# Patient Record
Sex: Female | Born: 1935
Health system: Southern US, Community
[De-identification: ages and names within clinical notes are randomized; demographics above are authoritative.]

## PROBLEM LIST (undated history)

## (undated) DIAGNOSIS — K759 Inflammatory liver disease, unspecified: Secondary | ICD-10-CM

## (undated) DIAGNOSIS — K859 Acute pancreatitis without necrosis or infection, unspecified: Secondary | ICD-10-CM

## (undated) DIAGNOSIS — G510 Bell's palsy: Secondary | ICD-10-CM

## (undated) DIAGNOSIS — E785 Hyperlipidemia, unspecified: Secondary | ICD-10-CM

## (undated) DIAGNOSIS — I4891 Unspecified atrial fibrillation: Secondary | ICD-10-CM

## (undated) DIAGNOSIS — I10 Essential (primary) hypertension: Secondary | ICD-10-CM

## (undated) DIAGNOSIS — G459 Transient cerebral ischemic attack, unspecified: Secondary | ICD-10-CM

## (undated) DIAGNOSIS — I679 Cerebrovascular disease, unspecified: Secondary | ICD-10-CM

## (undated) DIAGNOSIS — K219 Gastro-esophageal reflux disease without esophagitis: Secondary | ICD-10-CM

## (undated) HISTORY — DX: Hyperlipidemia, unspecified: E78.5

## (undated) HISTORY — DX: Essential (primary) hypertension: I10

## (undated) HISTORY — DX: Transient cerebral ischemic attack, unspecified: G45.9

## (undated) HISTORY — PX: CATARACT EXTRACTION: SUR2

## (undated) HISTORY — DX: Cerebrovascular disease, unspecified: I67.9

## (undated) HISTORY — PX: CHOLECYSTECTOMY: SHX55

## (undated) HISTORY — DX: Bell's palsy: G51.0

## (undated) HISTORY — PX: APPENDECTOMY: SHX54

---
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
## 2003-08-28 ENCOUNTER — Emergency Department (HOSPITAL_COMMUNITY): Admission: EM | Admit: 2003-08-28 | Discharge: 2003-08-28 | Payer: Self-pay | Admitting: Emergency Medicine

## 2003-09-03 ENCOUNTER — Emergency Department (HOSPITAL_COMMUNITY): Admission: EM | Admit: 2003-09-03 | Discharge: 2003-09-03 | Payer: Self-pay | Admitting: Emergency Medicine

## 2003-12-08 ENCOUNTER — Ambulatory Visit: Payer: Self-pay | Admitting: Family Medicine

## 2004-12-01 ENCOUNTER — Ambulatory Visit: Payer: Self-pay | Admitting: Family Medicine

## 2004-12-22 ENCOUNTER — Ambulatory Visit: Payer: Self-pay | Admitting: Family Medicine

## 2005-02-02 ENCOUNTER — Ambulatory Visit: Payer: Self-pay | Admitting: Family Medicine

## 2005-04-06 ENCOUNTER — Ambulatory Visit: Payer: Self-pay | Admitting: Family Medicine

## 2005-04-28 ENCOUNTER — Inpatient Hospital Stay (HOSPITAL_COMMUNITY): Admission: EM | Admit: 2005-04-28 | Discharge: 2005-05-03 | Payer: Self-pay | Admitting: Emergency Medicine

## 2005-05-05 ENCOUNTER — Ambulatory Visit: Payer: Self-pay | Admitting: Family Medicine

## 2005-05-12 ENCOUNTER — Ambulatory Visit: Payer: Self-pay | Admitting: Family Medicine

## 2005-06-16 ENCOUNTER — Ambulatory Visit: Payer: Self-pay | Admitting: Family Medicine

## 2005-06-21 ENCOUNTER — Ambulatory Visit: Payer: Self-pay | Admitting: Internal Medicine

## 2007-01-18 DIAGNOSIS — G510 Bell's palsy: Secondary | ICD-10-CM

## 2007-01-18 HISTORY — DX: Bell's palsy: G51.0

## 2007-04-24 ENCOUNTER — Emergency Department (HOSPITAL_COMMUNITY): Admission: EM | Admit: 2007-04-24 | Discharge: 2007-04-24 | Payer: Self-pay | Admitting: Emergency Medicine

## 2007-08-21 ENCOUNTER — Ambulatory Visit (HOSPITAL_COMMUNITY): Admission: RE | Admit: 2007-08-21 | Discharge: 2007-08-21 | Payer: Self-pay | Admitting: Neurology

## 2007-11-09 ENCOUNTER — Ambulatory Visit: Payer: Self-pay | Admitting: Cardiology

## 2010-06-04 NOTE — Group Therapy Note (Signed)
NAMELUCAS, Danielle Colon              ACCOUNT NO.:  [id number]   MEDICAL RECORD NO.:  [id number]          PATIENT TYPE:  INP   LOCATION:  A313                          FACILITY:  [facility]   PHYSICIAN:  Margaretmary Dys, M.D.DATE OF BIRTH:  [birth year]   DATE OF PROCEDURE:  05/01/2005  DATE OF DISCHARGE:                                   PROGRESS NOTE   SUBJECTIVE:  The patient continues to do fairly well.  She says her pain is  much better.  However, it appears that the patient is a little  hallucinatory.  The patient reports that she has seen some objects and  things on the wall.  She has no history of alcohol, as far as we know.  My  concern is that it is probably due to the narcotic analgesia.  Overall, she  feels great.  She has had some bowel movements, and her diet has been  advanced with good tolerance.   OBJECTIVE:  GENERAL:  Conscious, alert, comfortable, not in acute distress.  VITAL SIGNS:  Blood pressure of 132/64, pulse 79, respirations 20,  temperature 98.5.  HEENT:  Normocephalic and atraumatic.  Oral mucosa was moist.  No exudates.  NECK:  Supple.  No JVD.  LUNGS:  Clear clinically.  Good air entry bilaterally.  HEART:  S1 and S2 regular.  No S3, S4, gallops or rubs.  ABDOMEN:  Soft, nontender.  Bowel sounds positive.  No masses palpable.  EXTREMITIES:  No pitting pedal edema.  No calf induration or tenderness was  noted.  CNS:  Conscious, alert, well oriented to time, place, and person.  The  patient complains of visual hallucinations with no auditory hallucinations.   LABORATORY/DIAGNOSTIC DATA:  White blood cell count was 16.3, hemoglobin  12.1, hematocrit 35.6, platelet count 259 with no left shift.  Sodium of  132, potassium 4, chloride 97, CO2 of 29, glucose 141, BUN 2, creatinine  0.9.  AST is down to 43, ALT down to 63.  Alkaline phosphatase was 189.  Albumin 2.7, amylase 46, lipase 23.   ASSESSMENT AND PLAN:  1.  Mental status changes.  The patient is having  some hallucinations, and I      think it probably related to Dilaudid.  I will discontinue Dilaudid now      and put her on Percocet as needed, as the patient is better controlled      by the patient.  Will institute neurologic checks q.4h. to make sure she      does not go into delirium tremens.  If she does, I will have concerns of      probably alcohol use that is not known to [place].  2.  Acute pancreatitis.  This is significantly improved.  Amylase and lipase      are back to normal.  The etiology is unclear.  Diet is well advanced      with good effect.  3.  Elevated liver function tests.  AST and ALT has continued to improve.   DISPOSITION:  Neurologic checks to monitor for any worsening mental status  with decreased IV  fluids to 75 mL an hour, and transfer to 3A as planned  above.  No indication for GI consultation at this time.  Will continue to  monitor her white cell count.  She has no evidence of infection warranting  empiric antibiotic therapy at this time.  Her leukocytosis could be related  to her pancreatitis.      Margaretmary Dys, M.D.  Electronically Signed     AM/MEDQ  D:  05/01/2005  T:  05/01/2005  Job:  [id number]

## 2010-06-04 NOTE — Group Therapy Note (Signed)
NAMEJILLIAM, Danielle Colon              ACCOUNT NO.:  [id number]   MEDICAL RECORD NO.:  [id number]          PATIENT TYPE:  INP   LOCATION:  A313                          FACILITY:  [facility]   PHYSICIAN:  Margaretmary Dys, M.D.DATE OF BIRTH:  1930-08-31   DATE OF PROCEDURE:  05/02/2005  DATE OF DISCHARGE:                                   PROGRESS NOTE   SUBJECTIVE:  The patient continues to do fairly well.   Pain is significantly better, actually gone.  The patient is no longer  hallucinatory.  She is having good bowel movements.  No fever or chills.  Tolerating her diet pretty well.   OBJECTIVE:  GENERAL:  Conscious, alert, comfortable, not in acute distress.  VITAL SIGNS:  Blood pressure was 150/86, pulse of 83, respirations 20,  temperature 98.1.  HEENT:  Normocephalic, atraumatic.  Oral mucosa was moist.  No exudates.  NECK:  Supple, no JVD or lymphadenopathy.  LUNGS:  Clear clinically with good air entry bilaterally.  CARDIAC:  S1, S2, regular, no S3, gallops or rubs.  ABDOMEN:  Soft, nontender, bowel sounds positive.  EXTREMITIES:  No edema.   LABORATORY DATA:  White blood cell count 12.9, hemoglobin 12.2, hematocrit  35.8, platelet count is 241.  No left shift.  Sodium 139, potassium 4.1,  chloride of 102, CO2 29, glucose 123, BUN of 2, creatinine 0.8, alkaline  phosphatase 167, AST is 21, ALT is 42, total protein 6.3, albumin 2.5,  calcium 8.7.  Lipase of 21.   ASSESSMENT AND PLAN:  1.  Acute pancreatitis.  This is much better with significant resolution of      the abdominal pain.  Diet has also been advanced with good tolerance.  2.  Borderline diabetes.  I have discussed with her that she will need      dietary control and also weight loss measures.  A referral to the      diabetic educator will be made.  3.  Elevated liver function tests.  This is beginning to improve.   DISPOSITION:  I will discontinue IV fluids, encourage ambulation, and will  likely discharge her home  in the next day or so.      Margaretmary Dys, M.D.  Electronically Signed     AM/MEDQ  D:  05/02/2005  T:  05/02/2005  Job:  [id number]

## 2010-06-04 NOTE — H&P (Signed)
Danielle Colon, Danielle Colon NO.:  [id number]   MEDICAL RECORD NO.:  [id number]          PATIENT TYPE:  INP   LOCATION:  A210                          FACILITY:  [facility]   PHYSICIAN:  Renato Battles, M.D.     DATE OF BIRTH:  Oct 16, 1930   DATE OF ADMISSION:  04/28/2005  DATE OF DISCHARGE:  LH                                HISTORY & PHYSICAL   REASON FOR ADMISSION:  Abdominal pain.   PRIMARY CARE PHYSICIAN:  Dr. Lysbeth Galas   HISTORY OF PRESENT ILLNESS:  The patient is a 76 year old white female with  progressively worse upper abdominal pain for last 3 days associated with  nausea and vomiting, decreased oral intake, positive for chills and  constipation, no chest pain, no shortness of breath otherwise.  The patient  decided to come today for evaluation and management.  She was found to have  elevated lipase and amylase.  She denies starting any new medications within  the last couple of weeks.   REVIEW OF SYSTEMS:  CONSTITUTIONAL SYSTEMS:  Positive for chills, no fever,  positive for decreased appetite.  GI:  Positive for nausea and vomiting and  constipation.  GU:  No dysuria, hematuria, or retention.  CARDIOPULMONARY:  No chest pain, no shortness of breath, no cough, no orthopnea or PND.   PAST MEDICAL HISTORY:  1.  Hypertension.  2.  Hypothyroidism.  3.  Hyperlipidemia.  4.  Obesity.   PAST SURGICAL HISTORY:  1.  Cholecystectomy.  2.  Tonsillectomy.  3.  Appendectomy.   HOME MEDICATIONS:  1.  Sular 10 mg p.o. daily for last 2 months.  2.  Hydrochlorothiazide 12.5 mg p.o. daily for last 2 months.  3.  Lipitor 10 mg p.o. daily.  4.  Synthroid 75 mcg p.o. daily.   ALLERGIES:  NO KNOWN DRUG ALLERGIES.   FAMILY HISTORY:  Noncontributory.   SOCIAL HISTORY:  No tobacco, alcohol, or drugs.  She lives with her son.  She works as Therapist, art.   PHYSICAL EXAMINATION:  GENERAL:  Alert, oriented x3, no acute distress.  VITALS:  Temperature 97.7, heart rate 87,  respiratory rate 20, blood  pressure 142/73, O2 saturation 95% on room air.  HEENT:  The head is atraumatic, normocephalic.  Pupils equal, round and  reactive to light and accommodation.  Extraocular movements intact  bilaterally.  NECK:  No lymphadenopathy.  No thyromegaly.  No JVD.  CHEST:  Clear to auscultation bilaterally.  No wheezing, rales, or rhonchi.  HEART:  Regular rate and rhythm.  No murmurs.  ABDOMEN:  Obese, soft, diffusely tender, absent bowel sounds.  EXTREMITIES:  No cyanosis, edema, or clubbing.   STUDIES:  CBC shows elevated white count 19,000 without left shift.   Electrolytes showed the hyponatremia at 154, elevated glucose of 118.   Liver functions showed hypoalbuminemia at 3.3.   Amylase and lipase were elevated at 332 and 127, respectively.   Abdominopelvic CT scan showed pancreatitis but no other abnormalities.   ASSESSMENT AND PLAN:  Problem 1. PANCREATITIS.  The patient will be nothing  by mouth.  We are going  to use morphine for pain control, Zofran for nausea  and vomiting, check ultrasound of her liver and fasting lipids.   Problem 2. HYPOTHYROIDISM.  Continue with thyroid.  Check TSH.   Problem 3. HYPERTENSION.  Continue on Sular and hydrochlorothiazide.   Problem 4. HYPERLIPIDEMIA.  Check fasting lipids.  Continue Lipitor.   Problem 5. LEUKOCYTOSIS OF UNKNOWN SIGNIFICANCE.  I am going to recheck in  the morning.   Problem 6. HYPONATREMIA.  Sodium will be replaced with IV fluids.   Problem 7. CONSTIPATION.  The patient will be given Senokot with that  problem.      Renato Battles, M.D.  Electronically Signed     SA/MEDQ  D:  04/28/2005  T:  04/28/2005  Job:  [id number]   cc:   Delaney Meigs, M.D.  Fax: [phone]

## 2010-06-04 NOTE — Group Therapy Note (Signed)
Danielle Colon, Danielle Colon              ACCOUNT NO.:  [id number]   MEDICAL RECORD NO.:  [id number]          PATIENT TYPE:  INP   LOCATION:  A210                          FACILITY:  [facility]   PHYSICIAN:  Margaretmary Dys, M.D.DATE OF BIRTH:  1930/10/05   DATE OF PROCEDURE:  DATE OF DISCHARGE:                                   PROGRESS NOTE   SUBJECTIVE:  The patient has had some abdominal pain, mostly left sided.  She denies any nausea or vomiting.  No fever or chills.  No frequency,  urgency or dysuria.  She says she has not had any bowel movement in about 4  days, and she usually has regular daily bowel movements.  She thinks that  morphine helps with the pain.  Patient had an ultrasound this morning, and  feels a little bit thirsty.   OBJECTIVE:  GENERAL:  Conscious, alert, in mild pain and distress.  VITAL SIGNS:  Blood pressure was 133/79, pulse of 92, respiratory rate of  20, temperature 99.1 degrees Fahrenheit.  HEENT:  Normocephalic, atraumatic.  Oral mucosa was moist.  No exudates.  NECK:  Supple.  No JVD.  No lymphadenopathy.  LUNGS:  Clear clinically with good air entry bilaterally.  HEART:  S1 and S2 regular.  No S3 or S4 gallops or rubs.  ABDOMEN:  Soft, obese.  Patient had some tenderness mostly in the epigastric  and left upper quadrant.  There was no discoloration of the skin over the  left flank.  Bowel sounds were positive.  No muscles palpated.  EXTREMITIES:  No edema.  No induration or tenderness was noted.   LABORATORY/DIAGNOSTIC DATA:  White blood cell count was 16,100, hemoglobin  12.8, hematocrit 37.3, platelet count was 225,000 with no left shift.  Sodium 134, potassium 3.4, chloride of 98, CO2 of 30.  Glucose 143, BUN of  4.0, creatinine 0.7, calcium of 8.4, phosphorus 3.3, magnesium 1.9.  Hemoglobin A1C was 6.3.  Cardiac enzymes were negative.  Urinalysis was  unremarkable.  Urine microscopic was unremarkable.  Abdominal ultrasound  done.  Gallbladder was not  visualized, consistent with a previous  cholecystectomy.  Common bile duct appeared within normal limits.  The  patient had evidence of pancreatitis and diffuse hepatocellular disease.  CT  scan was reviewed which was consistent again with pancreatitis.   ASSESSMENT AND PLAN:  [name] is a 77 year old Caucasian female  admitted with acute pancreatitis.  The etiology is unclear.  Patient denies  any alcohol use.  She is status post cholecystectomy, and her common bile  duct appears unremarkable on ultrasound.  I am still awaiting the lipid  profile on her to see if she has  any evidence of hypertriglyceridemia that could have predisposed her to the  pancreatitis.  We will continue hydration and pain control.  We will resume  all of her previous home medications at this time.  Overall, she is stable.  We will advance her diet.  If her pain continues, will require GI consult to  see her for further evaluation.      Margaretmary Dys, M.D.  Electronically  Signed     AM/MEDQ  D:  04/29/2005  T:  04/29/2005  Job:  [id number]

## 2010-06-04 NOTE — Discharge Summary (Signed)
Danielle Colon, SPOTO NO.:  [id number]   MEDICAL RECORD NO.:  [id number]          PATIENT TYPE:  INP   LOCATION:  A313                          FACILITY:  [facility]   PHYSICIAN:  Renato Battles, M.D.     DATE OF BIRTH:  08-May-1930   DATE OF ADMISSION:  04/28/2005  DATE OF DISCHARGE:  LH                                 DISCHARGE SUMMARY   REASON FOR ADMISSION:  Pancreatitis.   DISCHARGE DIAGNOSES:  1.  Acute pancreatitis, resolved.  2.  Hypertension.  3.  Hypothyroidism.  4.  Hyperlipidemia.  5.  Obesity.   DISCHARGE MEDICATIONS:  1.  Sular 10 mg p.o. every day.  2.  Hydrochlorothiazide 12.5 mg p.o. every day.  3.  Lipitor 10 mg p.o. every day.  4.  Synthroid 75 mcg p.o. every day.   PROCEDURES:  Abdominal ultrasound showed changes consistent with  cholecystectomy, upper normal size of common bile duct with no obstruction.   CONSULTATIONS:  None.   HISTORY AND PHYSICAL/HOSPITAL COURSE:  The patient is a very pleasant 77-  year-old white female who presented to the emergency department for  abdominal pain x3 days, was found to have elevated liver function, elevated  white count, and pancreatitis.  Lipids were within normal limits.  Ultrasound did not show any acute obstruction.  Her liver functions started  trending toward normal and actually normalized prior to discharge except for  alkaline phosphatase which is trending down as well.  She tolerated diet  well and was ready to be discharged.  Her amylase and lipase normalized.  At  this point, I think the patient had common bile duct stone that was  transient and passed down.  And, she is safe to be discharged.  Other  important finding on ultrasound was fatty infiltration of the liver.  The  patient was counseled regarding diet and exercise.   DISPOSITION:  Home.   DIET:  Low fat, low salt.   CONDITION ON DISCHARGE:  Stable.      Renato Battles, M.D.  Electronically Signed     SA/MEDQ  D:   05/03/2005  T:  05/03/2005  Job:  [id number]   cc:   Delaney Meigs, M.D.  Fax: [phone]

## 2010-06-04 NOTE — Group Therapy Note (Signed)
NAMEKRISTE, BROMAN              ACCOUNT NO.:  [id number]   MEDICAL RECORD NO.:  [id number]          PATIENT TYPE:  INP   LOCATION:  A210                          FACILITY:  [facility]   PHYSICIAN:  Margaretmary Dys, M.D.DATE OF BIRTH:  17-Jun-1930   DATE OF PROCEDURE:  DATE OF DISCHARGE:                                   PROGRESS NOTE   SUBJECTIVE:  The patient feels a lot better today.  Morphine was changed to  Dilaudid and she thinks that has really helped her.  She denies any nausea  and would like to have her diet advanced.  She had a bowel movement.  Overall, she feels great.   OBJECTIVE:  GENERAL:  Conscious, alert, comfortable, not in acute distress.  VITAL SIGNS:  Blood pressure 118/58, pulse of 81, respirations 20,  temperature 98.3.  HEENT:  Normocephalic, atraumatic.  Oral mucosa was dry, no exudates.  NECK:  Supple, no JVD or lymphadenopathy.  LUNGS:  Clear clinically.  HEART:  S1 and S2 regular.  ABDOMEN:  Soft, some mild tenderness in the epigastric and left upper  quadrant, but this is a lot better.  EXTREMITIES:  No edema.  CNS EXAM:  Grossly intact with no focal deficits.   LABORATORY DATA:  White blood cell count 17.6, hemoglobin was 12.8,  hematocrit 38.3, platelet count was 244.  Sodium of 133, potassium 4.3,  chloride of 102, CO2 26, glucose 153, BUN of 3, creatinine 0.7, total bili  0.9, direct 0.4, AST 69, ALT 72, amylase of 53, lipase of 23.   ASSESSMENT AND PLAN:  1.  Acute pancreatitis:  This has significantly improved.  Amylase and      lipase are actually back to normal.  Etiology of her pancreatitis again      is unclear.  Ultrasound was unremarkable.   She did have some evidence of fatty liver.   Will continue advancing her diet and will decrease her IV fluids.  Continue  pain control.   1.  Elevated liver function tests:  Even though the ultrasound showed fatty      liver when she came in, her liver functions were normal.  Will continue      to  monitor this while in the hospital.  This may all represent evidence      of      some kind of a viral infection.  2.  Disposition:  Will transfer to 3-A, discontinue telemetry, continue      intravenous fluids and pain control.  I will hold off a Gastroenterology      consult at this time.      Margaretmary Dys, M.D.  Electronically Signed     AM/MEDQ  D:  04/30/2005  T:  04/30/2005  Job:  [id number]

## 2010-10-12 LAB — CBC
HCT: 42.2
Hemoglobin: 14.4
MCHC: 34
MCV: 89.7
Platelets: 212
RBC: 4.71
RDW: 13.1
WBC: 9.6

## 2010-10-12 LAB — DIFFERENTIAL
Basophils Absolute: 0.1
Basophils Relative: 1
Eosinophils Absolute: 0.3
Eosinophils Relative: 3
Lymphocytes Relative: 29
Lymphs Abs: 2.8
Monocytes Absolute: 0.9
Monocytes Relative: 10
Neutro Abs: 5.5
Neutrophils Relative %: 57

## 2010-10-12 LAB — POCT CARDIAC MARKERS
CKMB, poc: 1
Myoglobin, poc: 61.5
Operator id: 295021
Troponin i, poc: <0.05

## 2010-10-12 LAB — POCT I-STAT, CHEM 8
BUN: 8
Calcium, Ion: 1.15
Chloride: 103
Creatinine, Ser: 1.1
Glucose, Bld: 92
HCT: 43
Hemoglobin: 14.6
Potassium: 4
Sodium: 138
TCO2: 29

## 2013-06-26 ENCOUNTER — Encounter: Payer: Self-pay | Admitting: Neurology

## 2013-06-26 ENCOUNTER — Encounter: Payer: Self-pay | Admitting: *Deleted

## 2013-06-27 ENCOUNTER — Encounter: Payer: Self-pay | Admitting: Neurology

## 2013-06-27 ENCOUNTER — Ambulatory Visit (INDEPENDENT_AMBULATORY_CARE_PROVIDER_SITE_OTHER): Payer: Medicare Other | Admitting: Neurology

## 2013-06-27 VITALS — BP 102/70 | HR 60 | Ht 61.0 in | Wt 184.5 lb

## 2013-06-27 DIAGNOSIS — Z8669 Personal history of other diseases of the nervous system and sense organs: Secondary | ICD-10-CM

## 2013-06-27 MED ORDER — BENZTROPINE MESYLATE 0.5 MG PO TABS: 0.5000 mg | ORAL_TABLET | Freq: Every day | ORAL | Status: DC

## 2013-06-27 NOTE — Progress Notes (Signed)
Reason for visit: Facial discomfort  Danielle Colon is a 80 y.o. female  History of present illness:  Danielle Colon is an 80 year old right-handed white female with a history of Bell's palsy on the right side, previously seen through this office in July 2009. The patient recovered from the Bell's palsy, and she seemed to be doing relatively well until the last 2 or 3 weeks. The patient indicates that she has noted that she may get cramping of the muscles of the right cheek when she laughs or smiles. She has noted at nighttime she will rule out the right side mouth. She denies any double vision or loss of vision, but the right eyelid is droopy, and she may require surgery to lift the eyelid in the near future. This has been an issue since the Bell's palsy in 2009. The patient denies any headache, double vision, loss of vision, or problems with numbness or weakness of the arms or legs. The patient has not had any change in balance. The patient has chronic constipation issues, and she will have occasional urinary incontinence. She has not had any alteration in taste. She indicates that the issues with drooling and the cramping sensations are new, and were not present prior to several weeks ago. She is sent to this office for further evaluation. The patient does report some problems with swallowing solids.  Past Medical History  Diagnosis Date  . Bell's palsy 2009    on the right  . Brain TIA   . Hypertension   . Hyperlipidemia   . Cerebrovascular disease   . History of Bell's palsy 06/27/2013    right    Past Surgical History  Procedure Laterality Date  . Gallbladder resection      Family History  Problem Relation Age of Onset  . Pneumonia Mother     old age  . Heart attack Father   . Diabetes Father   . Diabetes Sister   . Diabetes Brother   . Cancer Sister   . Diabetes Sister   . Diabetes Sister     Social history:  reports that she has never smoked. She has never used  smokeless tobacco. She reports that she does not drink alcohol or use illicit drugs.  Medications:  Current Outpatient Prescriptions on File Prior to Visit  Medication Sig Dispense Refill  . amLODipine (NORVASC) 5 MG tablet Take 5 mg by mouth daily.      Marland Kitchen. aspirin 325 MG tablet Take 325 mg by mouth daily.      Marland Kitchen. bismuth subsalicylate (PEPTO BISMOL) 262 MG/15ML suspension Take 30 mLs by mouth every 6 (six) hours as needed.      . hydrochlorothiazide (MICROZIDE) 12.5 MG capsule Take 12.5 mg by mouth daily.      . vitamin B-12 (CYANOCOBALAMIN) 500 MCG tablet Take 500 mcg by mouth daily.       No current facility-administered medications on file prior to visit.      Allergies  Allergen Reactions  . Tessalon Perles [Benzonatate] Other (See Comments)  . Neurontin [Gabapentin] Other (See Comments)  . Zantac [Ranitidine Hcl] Other (See Comments)    GI upset    ROS:  Out of a complete 14 system review of symptoms, the patient complains only of the following symptoms, and all other reviewed systems are negative.  Difficulty swallowing, solids Blurred vision, eye pain Cough, snoring Urinary incontinence, constipation Runny nose Memory loss, difficulty swallowing, decreased energy Snoring  Blood pressure 102/70, pulse  60, height 5\' 1"  (1.549 m), weight 184 lb 8 oz (83.689 kg).  Physical Exam  General: The patient is alert and cooperative at the time of the examination.  Eyes: Pupils are equal, round, and reactive to light. Discs are flat bilaterally.  Neck: The neck is supple, no carotid bruits are noted.  Respiratory: The respiratory examination is clear.  Cardiovascular: The cardiovascular examination reveals a regular rate and rhythm, no obvious murmurs or rubs are noted.  Skin: Extremities are without significant edema.  Neurologic Exam  Mental status: The patient is alert and oriented x 3 at the time of the examination. The patient has apparent normal recent and remote  memory, with an apparently normal attention span and concentration ability.  Cranial nerves: Facial symmetry is not present. Ptosis of the right eyelid is seen. There is good sensation of the face to pinprick and soft touch on the left, decreased on the right. The strength of the facial muscles and the muscles to head turning and shoulder shrug are normal bilaterally. When the patient is puckering lips, the right eye will close, and the right cheek will elevate. Speech is well enunciated, no aphasia or dysarthria is noted. Extraocular movements are full. Visual fields are full. The tongue is midline, and the patient has symmetric elevation of the soft palate. No obvious hearing deficits are noted.  Motor: The motor testing reveals 5 over 5 strength of all 4 extremities. Good symmetric motor tone is noted throughout.  Sensory: Sensory testing is intact to pinprick, soft touch, vibration sensation, and position sense on all 4 extremities. No evidence of extinction is noted.  Coordination: Cerebellar testing reveals good finger-nose-finger and heel-to-shin bilaterally.  Gait and station: Gait is normal. Tandem gait is slightly unsteady. Romberg is negative. No drift is seen.  Reflexes: Deep tendon reflexes are symmetric and normal bilaterally. Toes are downgoing bilaterally.   MRI brain 08/21/2007:  IMPRESSION:  1. No acute intracranial abnormality.  2. Minimal periventricular and central pontine white matter  change. This is nonspecific, but may be related to chronic  microvascular ischemia.  3. Prominent superior ophthalmic vein on the left. No definite  etiology of this is identified. This could represent an orbital  varix.     Assessment/Plan:  1. 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 Many of her complaints with muscle cramps and drooling at night could be explained by the prior  Bell's palsy, but it is not clear to me why these issues have come up only over the last several weeks. The patient is reporting some decreased sensation on the right face, and for this reason, MRI evaluation of the brain will be done. She will be given a low dose prescription for Cogentin at night to help reduce the drooling during sleep. The patient will followup through this office if needed.  Marlan Palau MD 06/27/2013 2:42 PM  [facility] [address] [place], [place] [address]  Phone [phone] Fax [phone]

## 2013-06-27 NOTE — Patient Instructions (Signed)
Bell's Palsy  Bell's palsy is a condition in which the muscles on one side of the face cannot move (paralysis). This is because the nerves in the face are paralyzed. It is most often thought to be caused by a virus. The virus causes swelling of the nerve that controls movement on one side of the face. The nerve travels through a tight space surrounded by bone. When the nerve swells, it can be compressed by the bone. This results in damage to the protective covering around the nerve. This damage interferes with how the nerve communicates with the muscles of the face. As a result, it can cause weakness or paralysis of the facial muscles.   Injury (trauma), tumor, and surgery may cause Bell's palsy, but most of the time the cause is unknown. It is a relatively common condition. It starts suddenly (abrupt onset) with the paralysis usually ending within 2 days. Bell's palsy is not dangerous. But because the eye does not close properly, you may need care to keep the eye from getting dry. This can include splinting (to keep the eye shut) or moistening with artificial tears. Bell's palsy very seldom occurs on both sides of the face at the same time.  SYMPTOMS    Eyebrow sagging.   Drooping of the eyelid and corner of the mouth.   Inability to close one eye.   Loss of taste on the front of the tongue.   Sensitivity to loud noises.  TREATMENT   The treatment is usually non-surgical. If the patient is seen within the first 24 to 48 hours, a short course of steroids may be prescribed, in an attempt to shorten the length of the condition. Antiviral medicines may also be used with the steroids, but it is unclear if they are helpful.   You will need to protect your eye, if you cannot close it. The cornea (clear covering over your eye) will become dry and can be damaged. Artificial tears can be used to keep your eye moist. Glasses or an eye patch should be worn to protect your eye.  PROGNOSIS   Recovery is variable, ranging  from days to months. Although the problem usually goes away completely (about 80% of cases resolve), predicting the outcome is impossible. Most people improve within 3 weeks of when the symptoms began. Improvement may continue for 3 to 6 months. A small number of people have moderate to severe weakness that is permanent.   HOME CARE INSTRUCTIONS    If your caregiver prescribed medication to reduce swelling in the nerve, use as directed. Do not stop taking the medication unless directed by your caregiver.   Use moisturizing eye drops as needed to prevent drying of your eye, as directed by your caregiver.   Protect your eye, as directed by your caregiver.   Use facial massage and exercises, as directed by your caregiver.   Perform your normal activities, and get your normal rest.  SEEK IMMEDIATE MEDICAL CARE IF:    There is pain, redness or irritation in the eye.   You or your child has an oral temperature above 102 F (38.9 C), not controlled by medicine.  MAKE SURE YOU:    Understand these instructions.   Will watch your condition.   Will get help right away if you are not doing well or get worse.  Document Released: 01/03/2005 Document Revised: 03/28/2011 Document Reviewed: 01/12/2009  [employer] Patient Information 2014 [employer].

## 2013-06-28 ENCOUNTER — Other Ambulatory Visit: Payer: Self-pay | Admitting: Neurology

## 2013-07-17 ENCOUNTER — Telehealth: Payer: Self-pay | Admitting: *Deleted

## 2013-07-17 NOTE — Telephone Encounter (Signed)
Called, could not leave VM message

## 2013-07-17 NOTE — Telephone Encounter (Signed)
I called patient. The patient has had problems with shortness of breath in the middle the night, lasting one to 2 hours. This does not occur during the day. She was placed on Cogentin around 11 for June, she stopped the medication 3 days ago. I have indicated she needs a medical evaluation for the shortness of breath.

## 2013-07-18 ENCOUNTER — Emergency Department (HOSPITAL_COMMUNITY): Payer: Medicare Other

## 2013-07-18 ENCOUNTER — Encounter (HOSPITAL_COMMUNITY): Payer: Self-pay | Admitting: Emergency Medicine

## 2013-07-18 ENCOUNTER — Telehealth: Payer: Self-pay

## 2013-07-18 ENCOUNTER — Observation Stay (HOSPITAL_COMMUNITY)
Admission: EM | Admit: 2013-07-18 | Discharge: 2013-07-19 | Disposition: A | Discharge status: Home / Self Care | Payer: Medicare Other | Attending: Internal Medicine | Admitting: Internal Medicine

## 2013-07-18 ENCOUNTER — Observation Stay (HOSPITAL_COMMUNITY): Payer: Medicare Other

## 2013-07-18 ENCOUNTER — Emergency Department (HOSPITAL_COMMUNITY)
Admission: EM | Admit: 2013-07-18 | Discharge: 2013-07-18 | Disposition: A | Discharge status: Home / Self Care | Payer: Medicare Other | Source: Home / Self Care | Attending: Family Medicine | Admitting: Family Medicine

## 2013-07-18 DIAGNOSIS — Z7982 Long term (current) use of aspirin: Secondary | ICD-10-CM | POA: Insufficient documentation

## 2013-07-18 DIAGNOSIS — I10 Essential (primary) hypertension: Secondary | ICD-10-CM | POA: Diagnosis present

## 2013-07-18 DIAGNOSIS — R079 Chest pain, unspecified: Secondary | ICD-10-CM

## 2013-07-18 DIAGNOSIS — Z79899 Other long term (current) drug therapy: Secondary | ICD-10-CM | POA: Insufficient documentation

## 2013-07-18 DIAGNOSIS — R0602 Shortness of breath: Principal | ICD-10-CM | POA: Insufficient documentation

## 2013-07-18 DIAGNOSIS — R0789 Other chest pain: Secondary | ICD-10-CM

## 2013-07-18 DIAGNOSIS — R29810 Facial weakness: Secondary | ICD-10-CM | POA: Insufficient documentation

## 2013-07-18 DIAGNOSIS — Z8669 Personal history of other diseases of the nervous system and sense organs: Secondary | ICD-10-CM

## 2013-07-18 DIAGNOSIS — R32 Unspecified urinary incontinence: Secondary | ICD-10-CM | POA: Diagnosis present

## 2013-07-18 DIAGNOSIS — K2100 Gastro-esophageal reflux disease with esophagitis, without bleeding: Secondary | ICD-10-CM

## 2013-07-18 DIAGNOSIS — Z8673 Personal history of transient ischemic attack (TIA), and cerebral infarction without residual deficits: Secondary | ICD-10-CM | POA: Insufficient documentation

## 2013-07-18 DIAGNOSIS — Z8639 Personal history of other endocrine, nutritional and metabolic disease: Secondary | ICD-10-CM | POA: Insufficient documentation

## 2013-07-18 DIAGNOSIS — Z862 Personal history of diseases of the blood and blood-forming organs and certain disorders involving the immune mechanism: Secondary | ICD-10-CM | POA: Insufficient documentation

## 2013-07-18 DIAGNOSIS — R911 Solitary pulmonary nodule: Secondary | ICD-10-CM | POA: Diagnosis present

## 2013-07-18 DIAGNOSIS — K219 Gastro-esophageal reflux disease without esophagitis: Secondary | ICD-10-CM | POA: Diagnosis present

## 2013-07-18 HISTORY — DX: Gastro-esophageal reflux disease without esophagitis: K21.9

## 2013-07-18 HISTORY — DX: Inflammatory liver disease, unspecified: K75.9

## 2013-07-18 LAB — CBC
HCT: 41.2 % (ref 36.0–46.0)
Hemoglobin: 13.9 g/dL (ref 12.0–15.0)
MCH: 30.5 pg (ref 26.0–34.0)
MCHC: 33.7 g/dL (ref 30.0–36.0)
MCV: 90.5 fL (ref 78.0–100.0)
Platelets: 220 10*3/uL (ref 150–400)
RBC: 4.55 MIL/uL (ref 3.87–5.11)
RDW: 13 % (ref 11.5–15.5)
WBC: 8.6 10*3/uL (ref 4.0–10.5)

## 2013-07-18 LAB — BASIC METABOLIC PANEL
Anion gap: 16 — ABNORMAL HIGH (ref 5–15)
BUN: 14 mg/dL (ref 6–23)
CO2: 24 mEq/L (ref 19–32)
Calcium: 10 mg/dL (ref 8.4–10.5)
Chloride: 99 mEq/L (ref 96–112)
Creatinine, Ser: 0.9 mg/dL (ref 0.50–1.10)
GFR calc Af Amer: 68 mL/min — ABNORMAL LOW (ref >90)
GFR calc non Af Amer: 58 mL/min — ABNORMAL LOW (ref >90)
Glucose, Bld: 94 mg/dL (ref 70–99)
Potassium: 3.8 mEq/L (ref 3.7–5.3)
Sodium: 139 mEq/L (ref 137–147)

## 2013-07-18 LAB — TROPONIN I: Troponin I: <0.3 ng/mL (ref <0.30)

## 2013-07-18 LAB — I-STAT TROPONIN, ED: Troponin i, poc: 0 ng/mL (ref 0.00–0.08)

## 2013-07-18 LAB — PRO B NATRIURETIC PEPTIDE: Pro B Natriuretic peptide (BNP): 40.2 pg/mL (ref 0–450)

## 2013-07-18 LAB — D-DIMER, QUANTITATIVE: D-Dimer, Quant: 0.73 ug/mL-FEU — ABNORMAL HIGH (ref 0.00–0.48)

## 2013-07-18 MED ORDER — SODIUM CHLORIDE 0.9 % IJ SOLN
3.0000 mL | Freq: Two times a day (BID) | INTRAMUSCULAR | Status: DC
  Administered 2013-07-18 – 2013-07-19 (×2): 3 mL via INTRAVENOUS

## 2013-07-18 MED ORDER — HYDROCHLOROTHIAZIDE 12.5 MG PO CAPS
12.5000 mg | ORAL_CAPSULE | Freq: Every day | ORAL | Status: DC
  Administered 2013-07-19: 12.5 mg via ORAL
  Filled 2013-07-18: qty 1

## 2013-07-18 MED ORDER — NITROGLYCERIN 0.4 MG SL SUBL: 0.4000 mg | SUBLINGUAL_TABLET | SUBLINGUAL | Status: DC | PRN

## 2013-07-18 MED ORDER — ACETAMINOPHEN 650 MG RE SUPP: 650.0000 mg | Freq: Four times a day (QID) | RECTAL | Status: DC | PRN

## 2013-07-18 MED ORDER — ASPIRIN 325 MG PO TABS
325.0000 mg | ORAL_TABLET | Freq: Every day | ORAL | Status: DC
  Administered 2013-07-19: 325 mg via ORAL
  Filled 2013-07-18: qty 1

## 2013-07-18 MED ORDER — ENOXAPARIN SODIUM 40 MG/0.4ML ~~LOC~~ SOLN
40.0000 mg | SUBCUTANEOUS | Status: DC
  Administered 2013-07-18: 40 mg via SUBCUTANEOUS
  Filled 2013-07-18 (×2): qty 0.4

## 2013-07-18 MED ORDER — LOSARTAN POTASSIUM 50 MG PO TABS
100.0000 mg | ORAL_TABLET | Freq: Every day | ORAL | Status: DC
  Administered 2013-07-19: 100 mg via ORAL
  Filled 2013-07-18: qty 2

## 2013-07-18 MED ORDER — SENNOSIDES-DOCUSATE SODIUM 8.6-50 MG PO TABS
1.0000 | ORAL_TABLET | Freq: Every evening | ORAL | Status: DC | PRN
  Filled 2013-07-18: qty 1

## 2013-07-18 MED ORDER — MORPHINE SULFATE 2 MG/ML IJ SOLN: 1.0000 mg | INTRAMUSCULAR | Status: DC | PRN

## 2013-07-18 MED ORDER — LOSARTAN POTASSIUM-HCTZ 100-12.5 MG PO TABS: 1.0000 | ORAL_TABLET | Freq: Every day | ORAL | Status: DC

## 2013-07-18 MED ORDER — ACETAMINOPHEN 325 MG PO TABS: 650.0000 mg | ORAL_TABLET | Freq: Four times a day (QID) | ORAL | Status: DC | PRN

## 2013-07-18 MED ORDER — ALUM & MAG HYDROXIDE-SIMETH 200-200-20 MG/5ML PO SUSP: 30.0000 mL | Freq: Four times a day (QID) | ORAL | Status: DC | PRN

## 2013-07-18 MED ORDER — IOHEXOL 350 MG/ML SOLN
100.0000 mL | Freq: Once | INTRAVENOUS | Status: AC | PRN
  Administered 2013-07-18: 100 mL via INTRAVENOUS

## 2013-07-18 MED ORDER — DOCUSATE SODIUM 100 MG PO CAPS
100.0000 mg | ORAL_CAPSULE | Freq: Two times a day (BID) | ORAL | Status: DC
  Administered 2013-07-18 – 2013-07-19 (×2): 100 mg via ORAL
  Filled 2013-07-18 (×3): qty 1

## 2013-07-18 NOTE — ED Notes (Signed)
Pt sent here [facility] due to sob x 3 nights. Reports that she wakes up feeling anxious and sob, denies having this during the daytime. Hx of bells palsy 9 years ago, still has droop to right side of face and recently started having pain to right side of face and went to dr Anne Hahnwillis and started on cogentin at bedtime, pt stopped taking it thinking this was possibly a side effect. Woke up this am with chest heaviness into left shoulder. ekg done, no acute distress noted at triage.

## 2013-07-18 NOTE — Telephone Encounter (Signed)
[employer] [facility] sent us a letter saying they have approved our request for coverage on Benztropine effective until 07/17/2014 Ref # [id number]

## 2013-07-18 NOTE — ED Provider Notes (Signed)
CSN: [id number]     Arrival date & time 07/18/13  1344 History   First MD Initiated Contact with Patient 07/18/13 1503     Chief Complaint  Patient presents with  . Shortness of Breath     (Consider location/radiation/quality/duration/timing/severity/associated sxs/prior Treatment) HPI Comments: Patient is an 80 year old female with a past medical history of hypertension, previous TIA, and Bell's Palsy who presents with a 3 day history of shortness of breath. Symptoms started gradually and remained constant since the onset. Patient reports the SOB was worse at night and worse when she lays flat. The SOB did not interfere with her daily activities. Patient reports waking up a few times the past couple nights gasping for air. Patient became more concerned when she woke up this morning with left sided chest heaviness that radiated to her left neck. The heaviness remained constant for "a few hours" before easing off. She took 325mg  ASA at home. The heaviness came back a second time and has since eased off. She is asymptomatic at this time. No aggravating/alleviating factors.   Patient is a 80 y.o. female presenting with shortness of breath.  Shortness of Breath Associated symptoms: chest pain   Associated symptoms: no abdominal pain, no fever, no neck pain and no vomiting     Past Medical History  Diagnosis Date  . Bell's palsy 2009    on the right  . Brain TIA   . Hypertension   . Hyperlipidemia   . Cerebrovascular disease   . History of Bell's palsy 06/27/2013    right   Past Surgical History  Procedure Laterality Date  . Gallbladder resection     Family History  Problem Relation Age of Onset  . Pneumonia Mother     old age  . Heart attack Father   . Diabetes Father   . Diabetes Sister   . Diabetes Brother   . Cancer Sister   . Diabetes Sister   . Diabetes Sister    History  Substance Use Topics  . Smoking status: Never Smoker   . Smokeless tobacco: Never Used  . Alcohol  Use: No   OB History   Grav Para Term Preterm Abortions TAB SAB Ect Mult Living                 Review of Systems  Constitutional: Negative for fever, chills and fatigue.  HENT: Negative for trouble swallowing.   Eyes: Negative for visual disturbance.  Respiratory: Positive for shortness of breath.   Cardiovascular: Positive for chest pain. Negative for palpitations.  Gastrointestinal: Negative for nausea, vomiting, abdominal pain and diarrhea.  Genitourinary: Negative for dysuria and difficulty urinating.  Musculoskeletal: Negative for arthralgias and neck pain.  Skin: Negative for color change.  Neurological: Negative for dizziness and weakness.  Psychiatric/Behavioral: Negative for dysphoric mood.      Allergies  Tessalon perles; Neurontin; and Zantac  Home Medications   Prior to Admission medications   Medication Sig Start Date End Date Taking? Authorizing Provider  amLODipine (NORVASC) 5 MG tablet Take 5 mg by mouth daily.   Yes Historical Provider, MD  aspirin 325 MG tablet Take 325-975 mg by mouth daily.    Yes Historical Provider, MD  benztropine (COGENTIN) 0.5 MG tablet Take 1 tablet (0.5 mg total) by mouth at bedtime. 06/27/13  Yes York Spanielharles K Willis, MD  losartan-hydrochlorothiazide [facility](HYZAAR) 100-12.5 MG per tablet Take 1 tablet by mouth daily.   Yes Historical Provider, MD  vitamin B-12 (CYANOCOBALAMIN) 500  MCG tablet Take 500 mcg by mouth daily.   Yes Historical Provider, MD   BP 125/66  Pulse 60  Temp(Src) 97.8 F (36.6 C) (Oral)  Resp 12  Ht 5\' 1"  (1.549 m)  Wt 180 lb (81.647 kg)  BMI 34.03 kg/m2  SpO2 100% Physical Exam  Nursing note and vitals reviewed. Constitutional: She is oriented to person, place, and time. She appears well-developed and well-nourished. No distress.  HENT:  Head: Normocephalic and atraumatic.  Right side facial droop secondary to bell's palsy.   Eyes: Conjunctivae and EOM are normal.  Neck: Normal range of motion.   Cardiovascular: Normal rate and regular rhythm.  Exam reveals no gallop and no friction rub.   No murmur heard. Pulmonary/Chest: Effort normal and breath sounds normal. She has no wheezes. She has no rales. She exhibits no tenderness.  Abdominal: Soft. She exhibits no distension. There is no tenderness. There is no rebound and no guarding.  Musculoskeletal: Normal range of motion.  No lower extremity edema or calf tenderness to palpation.   Neurological: She is alert and oriented to person, place, and time. Coordination normal.  Speech is goal-oriented. Moves limbs without ataxia.   Skin: Skin is warm and dry.  Psychiatric: She has a normal mood and affect. Her behavior is normal.    ED Course  Procedures (including critical care time) Labs Review Labs Reviewed  BASIC METABOLIC PANEL - Abnormal; Notable for the following:    GFR calc non Af Amer 58 (*)    GFR calc Af Amer 68 (*)    Anion gap 16 (*)    All other components within normal limits  PRO B NATRIURETIC PEPTIDE  CBC  D-DIMER, QUANTITATIVE  I-STAT TROPOININ, ED    Imaging Review Dg Chest 2 View  07/18/2013   CLINICAL DATA:  Shortness of breath, left chest tightness  EXAM: CHEST  2 VIEW  COMPARISON:  None.  FINDINGS: Lungs are clear.  No pleural effusion or pneumothorax.  The heart is normal in size.  Degenerative changes of the visualized thoracolumbar spine.  IMPRESSION: No evidence of acute cardiopulmonary disease.   Electronically Signed   By: Charline BillsSriyesh  Krishnan M.D.   On: 07/18/2013 14:40     EKG Interpretation   Date/Time:  Thursday July 18 2013 13:57:02 EDT Ventricular Rate:  66 PR Interval:  172 QRS Duration: 72 QT Interval:  418 QTC Calculation: 438 R Axis:   -54 Text Interpretation:  Normal sinus rhythm Left axis deviation Abnormal ECG  No significant change since last tracing Confirmed by GOLDSTON  MD, SCOTT  ([id number]) on 07/18/2013 3:06:45 PM      MDM   Final diagnoses:  Chest pain, unspecified chest  pain type  SOB (shortness of breath)    3:55 PM Labs pending. EKG shows left axis deviation without other acute changes. Chest xray shows no acute changes. Vitals stable and patient afebrile. Patient is asymptomatic at this time.   5:31 PM Patient will be admitted over night for observation. Dr. Lendell CapriceSullivan will see the patient.   Emilia BeckKaitlyn Molly Savarino, PA-C 07/18/13 1732

## 2013-07-18 NOTE — ED Notes (Signed)
Pt      Reports   Symptoms  Of  Shortness   Of  Breath     And  heavyness   In  Chest  X  3  Days    Pt     Also  Reports      Some  Shortness  Of  Breath          On  Exertion  As  Well   She  denys  Any  Chest  Pain       At  This   Time       She  Ambulated to  Room  And  Displays   A   Steady  Fluid  Gait   sHe  Is  Not  Short of  Breath   Her skin is  Warm and  Dry

## 2013-07-18 NOTE — ED Provider Notes (Signed)
CSN: [id number]     Arrival date & time 07/18/13  1212 History   First MD Initiated Contact with Patient 07/18/13 1237     Chief Complaint  Patient presents with  . Chest Pain   (Consider location/radiation/quality/duration/timing/severity/associated sxs/prior Treatment) Patient is a 80 y.o. female presenting with chest pain. The history is provided by the patient. No language interpreter was used.  Chest Pain Pain location:  L chest Pain quality: aching   Pain radiates to:  L shoulder Pain severity:  Mild Onset quality:  Sudden Timing:  Constant Progression:  Worsening Chronicity:  New Context: at rest   Relieved by:  Nothing Worsened by:  Nothing tried Ineffective treatments:  Aspirin Associated symptoms: no abdominal pain   Risk factors: hypertension   Pt reports chest discomfort for 3 days.  Pt reports worse last pm.   Pt has taken 3 asa today with some relief.  Pt has a family history of coronary artery disease.   Past Medical History  Diagnosis Date  . Bell's palsy 2009    on the right  . Brain TIA   . Hypertension   . Hyperlipidemia   . Cerebrovascular disease   . History of Bell's palsy 06/27/2013    right   Past Surgical History  Procedure Laterality Date  . Gallbladder resection     Family History  Problem Relation Age of Onset  . Pneumonia Mother     old age  . Heart attack Father   . Diabetes Father   . Diabetes Sister   . Diabetes Brother   . Cancer Sister   . Diabetes Sister   . Diabetes Sister    History  Substance Use Topics  . Smoking status: Never Smoker   . Smokeless tobacco: Never Used  . Alcohol Use: No   OB History   Grav Para Term Preterm Abortions TAB SAB Ect Mult Living                 Review of Systems  Cardiovascular: Positive for chest pain.  Gastrointestinal: Negative for abdominal pain.  All other systems reviewed and are negative.   Allergies  Tessalon perles; Neurontin; and Zantac  Home Medications   Prior to  Admission medications   Medication Sig Start Date End Date Taking? Authorizing Provider  amLODipine (NORVASC) 5 MG tablet Take 5 mg by mouth daily.    Historical Provider, MD  aspirin 325 MG tablet Take 325 mg by mouth daily.    Historical Provider, MD  benztropine (COGENTIN) 0.5 MG tablet Take 1 tablet (0.5 mg total) by mouth at bedtime. 06/27/13   York Spanielharles K Willis, MD  bismuth subsalicylate (PEPTO BISMOL) 262 MG/15ML suspension Take 30 mLs by mouth every 6 (six) hours as needed.    Historical Provider, MD  hydrochlorothiazide (MICROZIDE) 12.5 MG capsule Take 12.5 mg by mouth daily.    Historical Provider, MD  losartan-hydrochlorothiazide (HYZAAR) 100-12.5 MG per tablet Take 1 tablet by mouth daily.    Historical Provider, MD  vitamin B-12 (CYANOCOBALAMIN) 500 MCG tablet Take 500 mcg by mouth daily.    Historical Provider, MD   There were no vitals taken for this visit. Physical Exam  Nursing note and vitals reviewed. Constitutional: She appears well-developed and well-nourished.  HENT:  Head: Normocephalic and atraumatic.  Eyes: Conjunctivae are normal. Pupils are equal, round, and reactive to light.  Cardiovascular: Normal rate, regular rhythm and normal heart sounds.   Pulmonary/Chest: Effort normal and breath sounds normal.  Abdominal: Soft.  Musculoskeletal: Normal range of motion.  Neurological: She is alert.  Skin: Skin is warm.    ED Course  Procedures (including critical care time) Labs Review Labs Reviewed - No data to display  Imaging Review No results found.   MDM   1. Chest pain, unspecified chest pain type   EKG normal sinus, normal ekg,   I am concerned that pt's pain may be cardiac.   I will send her to ED for evaluation    Elson AreasLeslie K Raymir Frommelt, PA-C 07/18/13 1302

## 2013-07-18 NOTE — H&P (Signed)
[facility] History and Physical  Danielle Colon DOB: 09/03/1930 DOA: 07/18/2013  Referring physician: EDP PCP: Danielle ConradBLUTH, Danielle Colon   Chief Complaint: chest pressure  HPI: Danielle Colon is a 80 y.o. female  With HTN presented to urgent care with left sided chest pressure at rest today. Now essentially gone.  Also had feelings of suffocation that wake her up at night for the past few nights. Feels like she "can't take a deep breath". Not positional or exertional. No cough, f/c, leg swelling palpitations.  No h/o heart disease or thromboembolism.  No previous stress test or cardiac catheterization.  Usually very active and walks every evening with her daughter. 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  Review of Systems:  Systems reviewed. As above. Otherwise negative.  Past Medical History  Diagnosis Date  . Bell's palsy 2009    on the right  . Brain TIA   . Hypertension   . Hyperlipidemia   . Cerebrovascular disease   . History of Bell's palsy 06/27/2013    right   Past Surgical History  Procedure Laterality Date  . Gallbladder resection     Social History:  reports that she has never smoked. She has never used smokeless tobacco. She reports that she does not drink alcohol or use illicit drugs.  Allergies  Allergen Reactions  . Tessalon Perles [Benzonatate] Other (See Comments)  . Neurontin [Gabapentin] Other (See Comments)  . Zantac [Ranitidine Hcl] Other (See Comments)    GI upset    Family History  Problem Relation Age of Onset  . Pneumonia Mother     old age  . Heart attack Father   . Diabetes Father   . Diabetes Sister   . Diabetes Brother   . Cancer Sister   . Diabetes Sister   . Diabetes Sister    sister with heart disease  Prior to Admission medications   Medication Sig Start Date End Date Taking? Authorizing Provider  amLODipine (NORVASC) 5 MG tablet Take 5 mg by mouth daily.   Yes Historical Provider, Colon  aspirin 325  MG tablet Take 325-975 mg by mouth daily.    Yes Historical Provider, Colon  benztropine (COGENTIN) 0.5 MG tablet Take 1 tablet (0.5 mg total) by mouth at bedtime. 06/27/13  Yes Danielle Spanielharles K Willis, Colon  losartan-hydrochlorothiazide [facility](HYZAAR) 100-12.5 MG per tablet Take 1 tablet by mouth daily.   Yes Historical Provider, Colon  vitamin B-12 (CYANOCOBALAMIN) 500 MCG tablet Take 500 mcg by mouth daily.   Yes Historical Provider, Colon   Physical Exam: Filed Vitals:   07/18/13 1735  BP: 113/67  Pulse: 52  Temp:   Resp: 15    BP 113/67  Pulse 52  Temp(Src) 97.8 F (36.6 C) (Oral)  Resp 15  Ht 5\' 1"  (1.549 m)  Wt 81.647 kg (180 lb)  BMI 34.03 kg/m2  SpO2 99% BP 113/67  Pulse 52  Temp(Src) 97.8 F (36.6 C) (Oral)  Resp 15  Ht 5\' 1"  (1.549 m)  Wt 81.647 kg (180 lb)  BMI 34.03 kg/m2  SpO2 99%  General Appearance:    Alert, cooperative, no distress, appears younger than stated age  Head:    Normocephalic, without obvious abnormality, atraumatic  Eyes:    PERRL, conjunctiva/corneas clear, EOM's intact, fundi    benign, both eyes     Nose:   Nares normal, septum midline, mucosa normal, no drainage    or sinus tenderness  Throat:  Lips, mucosa, and tongue normal; teeth and gums normal  Neck:   Supple, symmetrical, trachea midline, no adenopathy;    thyroid:  no enlargement/tenderness/nodules; no carotid   bruit or JVD  Back:     Symmetric, no curvature, ROM normal, no CVA tenderness  Lungs:     Clear to auscultation bilaterally, respirations unlabored  Chest Wall:    Left chest tenderness, feels different than pressure   Heart:    Regular rate and rhythm, S1 and S2 normal, no murmur, rub   or gallop     Abdomen:     Soft, non-tender, bowel sounds active all four quadrants,    no masses, no organomegaly  Genitalia:    deferred  Rectal:    deferred  Extremities:   Extremities normal, atraumatic, no cyanosis right leg with trace edema.  Calf is tender. No erythema warmth. Homans negative    Pulses:   2+ and symmetric all extremities  Skin:   Skin color, texture, turgor normal, no rashes or lesions  Lymph nodes:   Cervical, supraclavicular, and axillary nodes normal  Neurologic:   CNII-XII intact, normal strength, sensation and reflexes    throughout           Psych: normal affect  Labs on Admission:  Basic Metabolic Panel:  Recent Labs Lab 07/18/13 1452  NA 139  K 3.8  CL 99  CO2 24  GLUCOSE 94  BUN 14  CREATININE 0.90  CALCIUM 10.0   Liver Function Tests: No results found for this basename: AST, ALT, ALKPHOS, BILITOT, PROT, ALBUMIN,  in the last 168 hours No results found for this basename: LIPASE, AMYLASE,  in the last 168 hours No results found for this basename: AMMONIA,  in the last 168 hours CBC:  Recent Labs Lab 07/18/13 1452  WBC 8.6  HGB 13.9  HCT 41.2  MCV 90.5  PLT 220   Cardiac Enzymes: No results found for this basename: CKTOTAL, CKMB, CKMBINDEX, TROPONINI,  in the last 168 hours  BNP (last 3 results)  Recent Labs  07/18/13 1452  PROBNP 40.2   CBG: No results found for this basename: GLUCAP,  in the last 168 hours  Radiological Exams on Admission: Dg Chest 2 View  07/18/2013   CLINICAL DATA:  Shortness of breath, left chest tightness  EXAM: CHEST  2 VIEW  COMPARISON:  None.  FINDINGS: Lungs are clear.  No pleural effusion or pneumothorax.  The heart is normal in size.  Degenerative changes of the visualized thoracolumbar spine.  IMPRESSION: No evidence of acute cardiopulmonary disease.   Electronically Signed   By: Charline BillsSriyesh  Krishnan M.D.   On: 07/18/2013 14:40    EKG: NSR, LAD  Assessment/Plan Principal Problem:   Chest pressure Active Problems:   History of Bell's palsy   Benign hypertension  Observe on telemetry. Continue aspirin. R/o MI. Check d dimer. NPO after midnight.  Code Status: full Family Communication:  Disposition Plan: home  Time spent: 50 min  Danielle Colon [facility] Pager  [phone]

## 2013-07-19 DIAGNOSIS — Z8669 Personal history of other diseases of the nervous system and sense organs: Secondary | ICD-10-CM

## 2013-07-19 DIAGNOSIS — R911 Solitary pulmonary nodule: Secondary | ICD-10-CM | POA: Diagnosis present

## 2013-07-19 DIAGNOSIS — R079 Chest pain, unspecified: Secondary | ICD-10-CM

## 2013-07-19 DIAGNOSIS — K2100 Gastro-esophageal reflux disease with esophagitis, without bleeding: Secondary | ICD-10-CM

## 2013-07-19 DIAGNOSIS — K219 Gastro-esophageal reflux disease without esophagitis: Secondary | ICD-10-CM | POA: Diagnosis present

## 2013-07-19 DIAGNOSIS — R32 Unspecified urinary incontinence: Secondary | ICD-10-CM | POA: Diagnosis present

## 2013-07-19 DIAGNOSIS — I10 Essential (primary) hypertension: Secondary | ICD-10-CM

## 2013-07-19 DIAGNOSIS — E785 Hyperlipidemia, unspecified: Secondary | ICD-10-CM

## 2013-07-19 DIAGNOSIS — I517 Cardiomegaly: Secondary | ICD-10-CM

## 2013-07-19 LAB — TROPONIN I: Troponin I: <0.3 ng/mL (ref <0.30)

## 2013-07-19 LAB — LIPID PANEL
Cholesterol: 184 mg/dL (ref 0–200)
HDL: 47 mg/dL (ref >39)
LDL Cholesterol: 108 mg/dL — ABNORMAL HIGH (ref 0–99)
Total CHOL/HDL Ratio: 3.9 RATIO
Triglycerides: 146 mg/dL (ref <150)
VLDL: 29 mg/dL (ref 0–40)

## 2013-07-19 MED ORDER — SUCRALFATE 1 GM/10ML PO SUSP: 1.0000 g | Freq: Three times a day (TID) | ORAL | Status: DC

## 2013-07-19 MED ORDER — FESOTERODINE FUMARATE ER 4 MG PO TB24
4.0000 mg | ORAL_TABLET | Freq: Every day | ORAL | Status: DC
  Administered 2013-07-19: 4 mg via ORAL
  Filled 2013-07-19: qty 1

## 2013-07-19 MED ORDER — SIMVASTATIN 10 MG PO TABS
10.0000 mg | ORAL_TABLET | Freq: Every day | ORAL | Status: DC
  Filled 2013-07-19: qty 1

## 2013-07-19 MED ORDER — PANTOPRAZOLE SODIUM 40 MG PO TBEC: 40.0000 mg | DELAYED_RELEASE_TABLET | Freq: Two times a day (BID) | ORAL | Status: DC

## 2013-07-19 MED ORDER — PANTOPRAZOLE SODIUM 40 MG PO TBEC
40.0000 mg | DELAYED_RELEASE_TABLET | Freq: Two times a day (BID) | ORAL | Status: DC
Start: 2013-07-19 — End: 2013-07-19
  Administered 2013-07-19: 40 mg via ORAL
  Filled 2013-07-19: qty 1

## 2013-07-19 MED ORDER — FESOTERODINE FUMARATE ER 4 MG PO TB24: 4.0000 mg | ORAL_TABLET | Freq: Every day | ORAL | Status: DC

## 2013-07-19 MED ORDER — ASPIRIN EC 325 MG PO TBEC: 325.0000 mg | DELAYED_RELEASE_TABLET | Freq: Every day | ORAL | Status: DC

## 2013-07-19 MED ORDER — SIMVASTATIN 10 MG PO TABS: 10.0000 mg | ORAL_TABLET | Freq: Every day | ORAL | Status: DC

## 2013-07-19 MED ORDER — NITROGLYCERIN 0.4 MG SL SUBL: 0.4000 mg | SUBLINGUAL_TABLET | SUBLINGUAL | Status: DC | PRN

## 2013-07-19 NOTE — Progress Notes (Signed)
UR Completed.  Saylee Sherrill Jane [phone] 07/19/2013  

## 2013-07-19 NOTE — Progress Notes (Signed)
[facility]  [facility]  WUJ:[id number]    DOB: 12/07/1930    DOA: 07/18/2013  PCP: Ernestine ConradBLUTH, KIRK, MD  Assessment/Plan: Principal Problem:   Chest pain - Currently chest pain improved, felt suffocating feeling and shortness of breath. D-dimer was elevated at 0.73. CT angiogram negative for any pulmonary embolism. Patient is ruled out for acute ACS - No prior cardiac workup, patient does have significant GERD history also, placed on Protonix.  - Cardiology consult obtained, will await further recommendations, keep n.p.o.  Active Problems:   Benign hypertension - Currently stable     GERD (gastroesophageal reflux disease) - Placed on Protonix     Urinary incontinence - placed on Toviaz, check UA and culture to rule out UTI    DVT Prophylaxis:  Code Status:  Family Communication:  Disposition: await cardiology recommendations   Consultants: Cardiology   Procedures:  None   Antibiotics:  None     Subjective: Patient seen and examined, chest pain has improved, denies any shortness of breath, nausea vomiting. Does have acid reflux history.   Objective: Weight change:   Intake/Output Summary (Last 24 hours) at 07/19/13 1015 Last data filed at 07/18/13 1520  Gross per 24 hour  Intake      0 ml  Output      0 ml  Net      0 ml   Blood pressure 135/64, pulse 71, temperature 98.6 F (37 C), temperature source Oral, resp. rate 20, height 5\' 1"  (1.549 m), weight 84.188 kg (185 lb 9.6 oz), SpO2 95.00%.  Physical Exam: General: Alert and awake, oriented x3, not in any acute distress. CVS: S1-S2 clear, no murmur rubs or gallops Chest: clear to auscultation bilaterally, no wheezing, rales or rhonchi Abdomen: soft nontender, nondistended, normal bowel sounds  Extremities: no cyanosis, clubbing or edema noted bilaterally Neuro: Cranial nerves II-XII intact, no focal neurological deficits  Lab Results: Basic Metabolic Panel:  Recent Labs Lab 07/18/13 1452   NA 139  K 3.8  CL 99  CO2 24  GLUCOSE 94  BUN 14  CREATININE 0.90  CALCIUM 10.0   Liver Function Tests: No results found for this basename: AST, ALT, ALKPHOS, BILITOT, PROT, ALBUMIN,  in the last 168 hours No results found for this basename: LIPASE, AMYLASE,  in the last 168 hours No results found for this basename: AMMONIA,  in the last 168 hours CBC:  Recent Labs Lab 07/18/13 1452  WBC 8.6  HGB 13.9  HCT 41.2  MCV 90.5  PLT 220   Cardiac Enzymes:  Recent Labs Lab 07/18/13 1924 07/19/13 0130 07/19/13 0730  TROPONINI <0.30 <0.30 <0.30   BNP: No components found with this basename: POCBNP,  CBG: No results found for this basename: GLUCAP,  in the last 168 hours   Micro Results: No results found for this or any previous visit (from the past 240 hour(s)).  Studies/Results: Dg Chest 2 View  07/18/2013   CLINICAL DATA:  Shortness of breath, left chest tightness  EXAM: CHEST  2 VIEW  COMPARISON:  None.  FINDINGS: Lungs are clear.  No pleural effusion or pneumothorax.  The heart is normal in size.  Degenerative changes of the visualized thoracolumbar spine.  IMPRESSION: No evidence of acute cardiopulmonary disease.   Electronically Signed   By: Charline BillsSriyesh  Krishnan M.D.   On: 07/18/2013 14:40   Ct Angio Chest Pe W/cm &/or Wo Cm  07/18/2013   CLINICAL DATA:  Chest pressure and shortness of Breath  EXAM: CT ANGIOGRAPHY CHEST WITH CONTRAST  TECHNIQUE: Multidetector CT imaging of the chest was performed using the standard protocol during bolus administration of intravenous contrast. Multiplanar CT image reconstructions and MIPs were obtained to evaluate the vascular anatomy.  CONTRAST:  100mL OMNIPAQUE IOHEXOL 350 MG/ML SOLN  COMPARISON:  None.  FINDINGS: No pleural effusion. There is no airspace consolidation. Dependent changes and atelectasis noted in the lung bases. Right upper lobe nodule measures 3 mm, image 39/series 6. No airspace consolidation.  The heart size is normal. No  pericardial effusion. 9 mm right paratracheal lymph node identified. 8 mm pre-vascular lymph node is noted. There is no pericardial effusion identified. Moderate size hiatal hernia is present. The main pulmonary artery appears normal. No lobar or segmental pulmonary artery filling defects identified. No lobar or segmental pulmonary artery filling defects noted to suggest an acute pulmonary embolus.  There is no enlarged axillary or supraclavicular lymph nodes.  Incidental imaging through the upper abdomen is on negative for acute findings. Review of the visualized bony structures is significant for multi level thoracic spondylosis.  Review of the MIP images confirms the above findings.  IMPRESSION: 1. No acute findings.  No evidence for acute pulmonary embolus. 2. Right upper lobe nodule measures 3 mm.   Electronically Signed   By: Signa Kellaylor  Stroud M.D.   On: 07/18/2013 20:49    Medications: Scheduled Meds: . aspirin  325 mg Oral Daily  . docusate sodium  100 mg Oral BID  . enoxaparin (LOVENOX) injection  40 mg Subcutaneous Q24H  . fesoterodine  4 mg Oral Daily  . hydrochlorothiazide  12.5 mg Oral Daily  . losartan  100 mg Oral Daily  . pantoprazole  40 mg Oral BID  . sodium chloride  3 mL Intravenous Q12H      LOS: 1 day   RAI,RIPUDEEP M.D. [facility] 07/19/2013, 10:15 AM Pager: [phone]  If 7PM-7AM, please contact night-coverage [email or web address] Password [email or web address]  **Disclaimer: This note was dictated with voice recognition software. Similar sounding words can inadvertently be transcribed and this note may contain transcription errors which may not have been corrected upon publication of note.**

## 2013-07-19 NOTE — Discharge Summary (Signed)
Physician Discharge Summary  Patient ID: Danielle Colon MRN: [id number] DOB/AGE: 78/09/1930 78 y.o.  Admit date: 07/18/2013 Discharge date: 07/19/2013  Primary Care Physician:  Ernestine ConradBLUTH, KIRK, MD  Discharge Diagnoses:     . Atypical Chest pain likely due to GERD and esophagitis . GERD (gastroesophageal reflux disease) . Urinary incontinence   Benign hypertension . Lung nodule seen on imaging study  Consults:  Cardiology, Dr Dietrich PatesPaula Ross   Recommendations for Outpatient Follow-up:  Patient is started on PPI and carafate, if patient still continues to have chest pain episodes, she will likely need GI referral for an endoscopy    Allergies:   Allergies  Allergen Reactions  . Tessalon Perles [Benzonatate] Other (See Comments)  . Neurontin [Gabapentin] Other (See Comments)  . Zantac [Ranitidine Hcl] Other (See Comments)    GI upset     Discharge Medications:   Medication List    STOP taking these medications       aspirin 325 MG tablet  Replaced by:  aspirin EC 325 MG tablet      TAKE these medications       amLODipine 5 MG tablet  Commonly known as:  NORVASC  Take 5 mg by mouth daily.     aspirin EC 325 MG tablet  Take 1 tablet (325 mg total) by mouth daily.     benztropine 0.5 MG tablet  Commonly known as:  COGENTIN  Take 1 tablet (0.5 mg total) by mouth at bedtime.     fesoterodine 4 MG Tb24 tablet  Commonly known as:  TOVIAZ  Take 1 tablet (4 mg total) by mouth daily.     losartan-hydrochlorothiazide 100-12.5 MG per tablet  Commonly known as:  HYZAAR  Take 1 tablet by mouth daily.     nitroGLYCERIN 0.4 MG SL tablet  Commonly known as:  NITROSTAT  Place 1 tablet (0.4 mg total) under the tongue every 5 (five) minutes as needed for chest pain.     pantoprazole 40 MG tablet  Commonly known as:  PROTONIX  Take 1 tablet (40 mg total) by mouth 2 (two) times daily before a meal.     simvastatin 10 MG tablet  Commonly known as:  ZOCOR  Take 1 tablet (10 mg  total) by mouth at bedtime.     sucralfate 1 GM/10ML suspension  Commonly known as:  CARAFATE  Take 10 mLs (1 g total) by mouth 4 (four) times daily -  with meals and at bedtime. X 4 weeks     vitamin B-12 500 MCG tablet  Commonly known as:  CYANOCOBALAMIN  Take 500 mcg by mouth daily.         Brief H and P: For complete details please refer to admission H and P, but in brief Danielle ChimesDorothy F Neyhart is a 80 y.o. female  With HTN presented to urgent care with left sided chest pressure at rest. Also had feelings of suffocation that wake her up at night for the past few nights. She felt like she "can't take a deep breath". Not positional or exertional. No cough, f/c, leg swelling palpitations. No h/o heart disease or thromboembolism. No previous stress test or cardiac catheterization. Usually very active and walks every evening with her daughter. 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 D-dimer was elevated at 0.73. CT angiogram was negative for any pulmonary embolism. Patient was ruled out for acute ACS  No prior cardiac workup, patient does have significant GERD history also, placed on Protonix.  Cardiology consult was obtained, per Dr Tenny Crawoss, Echo was obtained which showed EF 60-65%, normal wall motion, grade 1 diastolic dysfunction.  Patient was placed on PPI and carafate and recommended to see GI out-patient if her symptoms of GERD are not improved.   Benign hypertension  - Currently stable   GERD (gastroesophageal reflux disease)  - Placed on Protonix and carafate, GI work-up out-patient  Urinary incontinence  - placed on Toviaz    Lung nodule seen on imaging study: CT chest showed 3mm lung nodule in right upper lobe.   Day of Discharge BP 135/64  Pulse 71   Temp(Src) 98.6 F (37 C) (Oral)  Resp 20  Ht 5\' 1"  (1.549 m)  Wt 84.188 kg (185 lb 9.6 oz)  BMI 35.09 kg/m2  SpO2 95%  Physical Exam: General: Alert and awake oriented x3 not in any acute distress. CVS: S1-S2 clear no murmur rubs or gallops Chest: clear to auscultation bilaterally, no wheezing rales or rhonchi Abdomen: soft nontender, nondistended, normal bowel sounds Extremities: no cyanosis, clubbing or edema noted bilaterally Neuro: Cranial nerves II-XII intact, no focal neurological deficits   The results of significant diagnostics from this hospitalization (including imaging, microbiology, ancillary and laboratory) are listed below for reference.    LAB RESULTS: Basic Metabolic Panel:  Recent Labs Lab 07/18/13 1452  NA 139  K 3.8  CL 99  CO2 24  GLUCOSE 94  BUN 14  CREATININE 0.90  CALCIUM 10.0   Liver Function Tests: No results found for this basename: AST, ALT, ALKPHOS, BILITOT, PROT, ALBUMIN,  in the last 168 hours No results found for this basename: LIPASE, AMYLASE,  in the last 168 hours No results found for this basename: AMMONIA,  in the last 168 hours CBC:  Recent Labs Lab 07/18/13 1452  WBC 8.6  HGB 13.9  HCT 41.2  MCV 90.5  PLT 220   Cardiac Enzymes:  Recent Labs Lab 07/19/13 0130 07/19/13 0730  TROPONINI <0.30 <0.30   BNP: No components found with this basename: POCBNP,  CBG: No results found for this basename: GLUCAP,  in the last 168 hours  Significant Diagnostic Studies:  Dg Chest 2 View  07/18/2013   CLINICAL DATA:  Shortness of breath, left chest tightness  EXAM: CHEST  2 VIEW  COMPARISON:  None.  FINDINGS: Lungs are clear.  No pleural effusion or pneumothorax.  The heart is normal in size.  Degenerative changes of the visualized thoracolumbar spine.  IMPRESSION: No evidence of acute cardiopulmonary disease.   Electronically Signed   By: Charline BillsSriyesh  Krishnan M.D.   On: 07/18/2013 14:40   Ct Angio Chest Pe W/cm &/or Wo Cm  07/18/2013    CLINICAL DATA:  Chest pressure and shortness of Breath  EXAM: CT ANGIOGRAPHY CHEST WITH CONTRAST  TECHNIQUE: Multidetector CT imaging of the chest was performed using the standard protocol during bolus administration of intravenous contrast. Multiplanar CT image reconstructions and MIPs were obtained to evaluate the vascular anatomy.  CONTRAST:  100mL OMNIPAQUE IOHEXOL 350 MG/ML SOLN  COMPARISON:  None.  FINDINGS: No pleural effusion. There is no airspace consolidation. Dependent changes and atelectasis noted in the lung bases. Right upper lobe nodule measures 3 mm, image 39/series 6. No airspace consolidation.  The heart size is normal.  No pericardial effusion. 9 mm right paratracheal lymph node identified. 8 mm pre-vascular lymph node is noted. There is no pericardial effusion identified. Moderate size hiatal hernia is present. The main pulmonary artery appears normal. No lobar or segmental pulmonary artery filling defects identified. No lobar or segmental pulmonary artery filling defects noted to suggest an acute pulmonary embolus.  There is no enlarged axillary or supraclavicular lymph nodes.  Incidental imaging through the upper abdomen is on negative for acute findings. Review of the visualized bony structures is significant for multi level thoracic spondylosis.  Review of the MIP images confirms the above findings.  IMPRESSION: 1. No acute findings.  No evidence for acute pulmonary embolus. 2. Right upper lobe nodule measures 3 mm.   Electronically Signed   By: Signa Kell M.D.   On: 07/18/2013 20:49    2D ECHO: Study Conclusions  - Left ventricle: The cavity size was normal. Wall thickness was normal. Systolic function was normal. The estimated ejection fraction was in the range of 60% to 65%. Wall motion was normal; there were no regional wall motion abnormalities. Doppler parameters are consistent with abnormal left ventricular relaxation (grade 1 diastolic dysfunction). Doppler  parameters are consistent with high ventricular filling pressure. - Mitral valve: Calcified annulus. - Left atrium: The atrium was moderately dilated.  Impressions:  - Vigorous LV function; grade 1 diastolic dysfunction; moderate LAE; trace MR and TR.     Disposition and Follow-up:     Discharge Instructions   Diet - low sodium heart healthy    Complete by:  As directed      Increase activity slowly    Complete by:  As directed             DISPOSITION: home DIET: heart healthy   DISCHARGE FOLLOW-UP Follow-up Information   Follow up with Ernestine Conrad, MD. Schedule an appointment as soon as possible for a visit in 10 days. (for hospital follow-up)    Specialty:  Family Medicine   Contact information:   [address] [place] [address] [phone]       Time spent on Discharge: 38 mins  Signed:   Travas Schexnayder M.D. [facility] 07/19/2013, 2:03 PM Pager: [phone]   **Disclaimer: This note was dictated with voice recognition software. Similar sounding words can inadvertently be transcribed and this note may contain transcription errors which may not have been corrected upon publication of note.**

## 2013-07-19 NOTE — ED Provider Notes (Signed)
Medical screening examination/treatment/procedure(s) were conducted as a shared visit with non-physician practitioner(s) and myself.  I personally evaluated the patient during the encounter.   EKG Interpretation   Date/Time:  Thursday July 18 2013 13:57:02 EDT Ventricular Rate:  66 PR Interval:  172 QRS Duration: 72 QT Interval:  418 QTC Calculation: 438 R Axis:   -54 Text Interpretation:  Normal sinus rhythm Left axis deviation Abnormal ECG  No significant change since last tracing Confirmed by Jaylina Ramdass  MD, Mackenzey Crownover  ([id number]) on 07/18/2013 3:06:45 PM      Patient with dyspnea for past 3 nights and now chest pressure. Stable at this time, symptoms controlled. Will admit for ACS r/o.   Audree CamelScott T Mirelle Biskup, MD 07/19/13 1332

## 2013-07-19 NOTE — Progress Notes (Signed)
Echocardiogram 2D Echocardiogram has been performed.  Dorothey BasemanReel, Faithlynn Deeley M 07/19/2013, 11:35 AM

## 2013-07-19 NOTE — Consult Note (Signed)
Primary Physician:  Bluth Primary Cardiologist:   HPI: Danielle Colon is an 80 yo who was admitted yesterday with CP  Pain L sided.  Pressure at rest.  Woke up with suffucating sensation over past few nights.  CT was negative for PE.   The patient say s about 2 to 3 days ago she started having episodes of smothering sensation taht woke her from sleep  She would get up, walk around  Drink some water.  Ease off.  Some chest tightness with this  Duringday was fine, maybe  With some increased fatigue but no chest symptoms.  Yesterday AM early symptoms were worse so she came to hospital  2 wks ago felt oK  Baseline. Last night slept ok  No smothering or chest tightness No change in diet  No F/C No N/V or diarrhea  No injury.     Past Medical History  Diagnosis Date  . Bell's palsy 2009;     on the right  . Brain TIA     pt denies this hx on 07/18/2013; "they thought it was a stroke but dr said later that it was Bell's Palsy"  . Hypertension   . Hyperlipidemia   . Cerebrovascular disease   . GERD (gastroesophageal reflux disease)   . Hepatitis     "don't remember what kind; had it ~ 3 times when I was younger; last time was in the 1990's"    Medications Prior to Admission  Medication Sig Dispense Refill  . amLODipine (NORVASC) 5 MG tablet Take 5 mg by mouth daily.      Marland Kitchen aspirin 325 MG tablet Take 325-975 mg by mouth daily.       . benztropine (COGENTIN) 0.5 MG tablet Take 1 tablet (0.5 mg total) by mouth at bedtime.  30 tablet  2  . losartan-hydrochlorothiazide (HYZAAR) 100-12.5 MG per tablet Take 1 tablet by mouth daily.      . vitamin B-12 (CYANOCOBALAMIN) 500 MCG tablet Take 500 mcg by mouth daily.         Marland Kitchen aspirin  325 mg Oral Daily  . docusate sodium  100 mg Oral BID  . enoxaparin (LOVENOX) injection  40 mg Subcutaneous Q24H  . hydrochlorothiazide  12.5 mg Oral Daily  . losartan  100 mg Oral Daily  . sodium chloride  3 mL Intravenous Q12H    Infusions:    Allergies    Allergen Reactions  . Tessalon Perles [Benzonatate] Other (See Comments)  . Neurontin [Gabapentin] Other (See Comments)  . Zantac [Ranitidine Hcl] Other (See Comments)    GI upset    History   Social History  . Marital Status: Single    Spouse Name: N/A    Number of Children: 4  . Years of Education: college-2   Occupational History  . retired   . private duty sitter    Social History Main Topics  . Smoking status: Never Smoker   . Smokeless tobacco: Never Used  . Alcohol Use: No  . Drug Use: No  . Sexual Activity: No   Other Topics Concern  . Not on file   Social History Narrative  . No narrative on file    Family History  Problem Relation Age of Onset  . Pneumonia Mother     old age  . Heart attack Father   . Diabetes Father   . Diabetes Sister   . Diabetes Brother   . Cancer Sister   . Diabetes Sister   .  Diabetes Sister     REVIEW OF SYSTEMS:  All systems reviewed  Negative to the above problem except as noted above.    PHYSICAL EXAM: Filed Vitals:   07/19/13 0500  BP: 130/57  Pulse: 73  Temp: 98.6 F (37 C)  Resp: 20     Intake/Output Summary (Last 24 hours) at 07/19/13 16100822 Last data filed at 07/18/13 1520  Gross per 24 hour  Intake      0 ml  Output      0 ml  Net      0 ml    General:  Well appearing. No respiratory difficulty  Denies CP   HEENT: normal Neck: supple. no JVD. Carotids 2+ bilat; no bruits. No lymphadenopathy or thryomegaly appreciated. Cor: PMI nondisplaced. Regular rate & rhythm. No rubs, gallops or murmurs. Lungs: clear Chest  Tender Abdomen: soft, nontender, nondistended. No hepatosplenomegaly. No bruits or masses. Good bowel sounds. Extremities: no cyanosis, clubbing, rash, edema Neuro: alert & oriented x 3, R side of face with droop ECG:  SR 66 bpm  Results for orders placed during the hospital encounter of 07/18/13 (from the past 24 hour(s))  PRO B NATRIURETIC PEPTIDE     Status: None   Collection Time     07/18/13  2:52 PM      Result Value Ref Range   Pro B Natriuretic peptide (BNP) 40.2  0 - 450 pg/mL  BASIC METABOLIC PANEL     Status: Abnormal   Collection Time    07/18/13  2:52 PM      Result Value Ref Range   Sodium 139  137 - 147 mEq/L   Potassium 3.8  3.7 - 5.3 mEq/L   Chloride 99  96 - 112 mEq/L   CO2 24  19 - 32 mEq/L   Glucose, Bld 94  70 - 99 mg/dL   BUN 14  6 - 23 mg/dL   Creatinine, Ser 9.600.90  0.50 - 1.10 mg/dL   Calcium 45.410.0  8.4 - 09.810.5 mg/dL   GFR calc non Af Amer 58 (*) >90 mL/min   GFR calc Af Amer 68 (*) >90 mL/min   Anion gap 16 (*) 5 - 15  CBC     Status: None   Collection Time    07/18/13  2:52 PM      Result Value Ref Range   WBC 8.6  4.0 - 10.5 K/uL   RBC 4.55  3.87 - 5.11 MIL/uL   Hemoglobin 13.9  12.0 - 15.0 g/dL   HCT 11.941.2  14.736.0 - 82.946.0 %   MCV 90.5  78.0 - 100.0 fL   MCH 30.5  26.0 - 34.0 pg   MCHC 33.7  30.0 - 36.0 g/dL   RDW 56.213.0  13.011.5 - 86.515.5 %   Platelets 220  150 - 400 K/uL  I-STAT TROPOININ, ED     Status: None   Collection Time    07/18/13  3:18 PM      Result Value Ref Range   Troponin i, poc 0.00  0.00 - 0.08 ng/mL   Comment 3           D-DIMER, QUANTITATIVE     Status: Abnormal   Collection Time    07/18/13  5:30 PM      Result Value Ref Range   D-Dimer, Quant 0.73 (*) 0.00 - 0.48 ug/mL-FEU  TROPONIN I     Status: None   Collection Time    07/18/13  7:24 PM  Result Value Ref Range   Troponin I <0.30  <0.30 ng/mL  TROPONIN I     Status: None   Collection Time    07/19/13  1:30 AM      Result Value Ref Range   Troponin I <0.30  <0.30 ng/mL  TROPONIN I     Status: None   Collection Time    07/19/13  7:30 AM      Result Value Ref Range   Troponin I <0.30  <0.30 ng/mL   Dg Chest 2 View  07/18/2013   CLINICAL DATA:  Shortness of breath, left chest tightness  EXAM: CHEST  2 VIEW  COMPARISON:  None.  FINDINGS: Lungs are clear.  No pleural effusion or pneumothorax.  The heart is normal in size.  Degenerative changes of the  visualized thoracolumbar spine.  IMPRESSION: No evidence of acute cardiopulmonary disease.   Electronically Signed   By: Charline BillsSriyesh  Krishnan M.D.   On: 07/18/2013 14:40   Ct Angio Chest Pe W/cm &/or Wo Cm  07/18/2013   CLINICAL DATA:  Chest pressure and shortness of Breath  EXAM: CT ANGIOGRAPHY CHEST WITH CONTRAST  TECHNIQUE: Multidetector CT imaging of the chest was performed using the standard protocol during bolus administration of intravenous contrast. Multiplanar CT image reconstructions and MIPs were obtained to evaluate the vascular anatomy.  CONTRAST:  100mL OMNIPAQUE IOHEXOL 350 MG/ML SOLN  COMPARISON:  None.  FINDINGS: No pleural effusion. There is no airspace consolidation. Dependent changes and atelectasis noted in the lung bases. Right upper lobe nodule measures 3 mm, image 39/series 6. No airspace consolidation.  The heart size is normal. No pericardial effusion. 9 mm right paratracheal lymph node identified. 8 mm pre-vascular lymph node is noted. There is no pericardial effusion identified. Moderate size hiatal hernia is present. The main pulmonary artery appears normal. No lobar or segmental pulmonary artery filling defects identified. No lobar or segmental pulmonary artery filling defects noted to suggest an acute pulmonary embolus.  There is no enlarged axillary or supraclavicular lymph nodes.  Incidental imaging through the upper abdomen is on negative for acute findings. Review of the visualized bony structures is significant for multi level thoracic spondylosis.  Review of the MIP images confirms the above findings.  IMPRESSION: 1. No acute findings.  No evidence for acute pulmonary embolus. 2. Right upper lobe nodule measures 3 mm.   Electronically Signed   By: Signa Kellaylor  Stroud M.D.   On: 07/18/2013 20:49     ASSESSMENT:  Patient is an 80 yo with history of HTN and HL (not on statin)  PResents with episodes concerning for PND  Exam is unremarkable(other than facial droop)  Labs negative.     EKG normal.  Iwould recomm echo to evaluate LV and RV function.  Follow BP  Continue outpatient meds    HTN  WIll follow  HL  Check in AM.

## 2013-07-19 NOTE — ED Provider Notes (Signed)
Medical screening examination/treatment/procedure(s) were performed by a resident physician or non-physician practitioner and as the supervising physician I was immediately available for consultation/collaboration.  Neida Ellegood, MD    Jancarlo Biermann S Natia Fahmy, MD 07/19/13 1650 

## 2013-07-28 ENCOUNTER — Ambulatory Visit
Admission: RE | Admit: 2013-07-28 | Discharge: 2013-07-28 | Disposition: A | Discharge status: Home / Self Care | Payer: Medicare Other | Source: Ambulatory Visit | Attending: Neurology | Admitting: Neurology

## 2013-07-28 DIAGNOSIS — Z8669 Personal history of other diseases of the nervous system and sense organs: Secondary | ICD-10-CM

## 2013-07-28 DIAGNOSIS — R259 Unspecified abnormal involuntary movements: Secondary | ICD-10-CM

## 2013-07-29 ENCOUNTER — Telehealth: Payer: Self-pay | Admitting: Neurology

## 2013-07-29 NOTE — Telephone Encounter (Signed)
  I called patient, unable to get an answer, MRI the brain is unremarkable with exception of minimal small vessel disease, no change from prior study done in 2009. I will call back later.   MRI brain 07/28/2013:  Impression   Slightly abnormal MRI scan of the brain showing mild age  appropriate changes of chronic microvascular ischemia and generalized  cerebral atrophy. No significant change compared with previous MRI scan  dated 08/21/2007

## 2013-07-29 NOTE — Telephone Encounter (Signed)
I called patient. I discussed the results of the MRI study of the brain. The patient indicates that she is doing much better with the pain issues.

## 2013-10-11 ENCOUNTER — Encounter: Payer: Self-pay | Admitting: Family Medicine

## 2013-12-04 ENCOUNTER — Encounter: Payer: Self-pay | Admitting: Neurology

## 2013-12-10 ENCOUNTER — Encounter: Payer: Self-pay | Admitting: Neurology

## 2018-12-27 ENCOUNTER — Other Ambulatory Visit: Payer: Self-pay

## 2018-12-27 ENCOUNTER — Emergency Department (HOSPITAL_COMMUNITY)
Admission: EM | Admit: 2018-12-27 | Discharge: 2018-12-27 | Disposition: A | Discharge status: Home / Self Care | Payer: Medicare Other | Attending: Emergency Medicine | Admitting: Emergency Medicine

## 2018-12-27 ENCOUNTER — Emergency Department (HOSPITAL_COMMUNITY): Payer: Medicare Other

## 2018-12-27 ENCOUNTER — Encounter (HOSPITAL_COMMUNITY): Payer: Self-pay

## 2018-12-27 DIAGNOSIS — Z7982 Long term (current) use of aspirin: Secondary | ICD-10-CM | POA: Diagnosis not present

## 2018-12-27 DIAGNOSIS — E079 Disorder of thyroid, unspecified: Secondary | ICD-10-CM | POA: Insufficient documentation

## 2018-12-27 DIAGNOSIS — E049 Nontoxic goiter, unspecified: Secondary | ICD-10-CM

## 2018-12-27 DIAGNOSIS — Z79899 Other long term (current) drug therapy: Secondary | ICD-10-CM | POA: Diagnosis not present

## 2018-12-27 DIAGNOSIS — I10 Essential (primary) hypertension: Secondary | ICD-10-CM | POA: Diagnosis not present

## 2018-12-27 DIAGNOSIS — Z20828 Contact with and (suspected) exposure to other viral communicable diseases: Secondary | ICD-10-CM | POA: Insufficient documentation

## 2018-12-27 DIAGNOSIS — R350 Frequency of micturition: Secondary | ICD-10-CM | POA: Diagnosis not present

## 2018-12-27 DIAGNOSIS — R0602 Shortness of breath: Secondary | ICD-10-CM | POA: Diagnosis present

## 2018-12-27 LAB — CBC
HCT: 38.7 % (ref 36.0–46.0)
Hemoglobin: 12.9 g/dL (ref 12.0–15.0)
MCH: 30.7 pg (ref 26.0–34.0)
MCHC: 33.3 g/dL (ref 30.0–36.0)
MCV: 92.1 fL (ref 80.0–100.0)
Platelets: 213 10*3/uL (ref 150–400)
RBC: 4.2 MIL/uL (ref 3.87–5.11)
RDW: 12.6 % (ref 11.5–15.5)
WBC: 8.5 10*3/uL (ref 4.0–10.5)
nRBC: 0 % (ref 0.0–0.2)

## 2018-12-27 LAB — HEPATIC FUNCTION PANEL
ALT: 14 U/L (ref 0–44)
AST: 19 U/L (ref 15–41)
Albumin: 3.7 g/dL (ref 3.5–5.0)
Alkaline Phosphatase: 65 U/L (ref 38–126)
Bilirubin, Direct: 0.2 mg/dL (ref 0.0–0.2)
Indirect Bilirubin: 0.6 mg/dL (ref 0.3–0.9)
Total Bilirubin: 0.8 mg/dL (ref 0.3–1.2)
Total Protein: 6.8 g/dL (ref 6.5–8.1)

## 2018-12-27 LAB — URINALYSIS, ROUTINE W REFLEX MICROSCOPIC
Bilirubin Urine: NEGATIVE
Glucose, UA: NEGATIVE mg/dL
Hgb urine dipstick: NEGATIVE
Ketones, ur: NEGATIVE mg/dL
Leukocytes,Ua: NEGATIVE
Nitrite: NEGATIVE
Protein, ur: NEGATIVE mg/dL
Specific Gravity, Urine: 1.003 — ABNORMAL LOW (ref 1.005–1.030)
pH: 7 (ref 5.0–8.0)

## 2018-12-27 LAB — BASIC METABOLIC PANEL
Anion gap: 8 (ref 5–15)
BUN: 19 mg/dL (ref 8–23)
CO2: 28 mmol/L (ref 22–32)
Calcium: 9.2 mg/dL (ref 8.9–10.3)
Chloride: 102 mmol/L (ref 98–111)
Creatinine, Ser: 1.08 mg/dL — ABNORMAL HIGH (ref 0.44–1.00)
GFR calc Af Amer: 54 mL/min — ABNORMAL LOW (ref >60)
GFR calc non Af Amer: 46 mL/min — ABNORMAL LOW (ref >60)
Glucose, Bld: 105 mg/dL — ABNORMAL HIGH (ref 70–99)
Potassium: 4.6 mmol/L (ref 3.5–5.1)
Sodium: 138 mmol/L (ref 135–145)

## 2018-12-27 LAB — TROPONIN I (HIGH SENSITIVITY)
Troponin I (High Sensitivity): 7 ng/L (ref <18)
Troponin I (High Sensitivity): 8 ng/L (ref <18)

## 2018-12-27 LAB — POC SARS CORONAVIRUS 2 AG -  ED: SARS Coronavirus 2 Ag: NEGATIVE

## 2018-12-27 LAB — BRAIN NATRIURETIC PEPTIDE: B Natriuretic Peptide: 96 pg/mL (ref 0.0–100.0)

## 2018-12-27 LAB — LIPASE, BLOOD: Lipase: 26 U/L (ref 11–51)

## 2018-12-27 LAB — D-DIMER, QUANTITATIVE: D-Dimer, Quant: 0.85 ug/mL-FEU — ABNORMAL HIGH (ref 0.00–0.50)

## 2018-12-27 MED ORDER — ALBUTEROL SULFATE HFA 108 (90 BASE) MCG/ACT IN AERS
2.0000 | INHALATION_SPRAY | Freq: Once | RESPIRATORY_TRACT | Status: AC
  Administered 2018-12-27: 2 via RESPIRATORY_TRACT
  Filled 2018-12-27: qty 6.7

## 2018-12-27 MED ORDER — IOHEXOL 350 MG/ML SOLN
100.0000 mL | Freq: Once | INTRAVENOUS | Status: AC | PRN
  Administered 2018-12-27: 100 mL via INTRAVENOUS

## 2018-12-27 NOTE — Discharge Instructions (Signed)
Your work-up today was overall reassuring.  Your CT scan did not show evidence of pulmonary embolism or pneumonia but did show some scar tissue in your lungs and also showed the enlarged thyroid.  Radiology recommended close follow-up for outpatient ultrasound which your PCP can manage.  Your heart enzymes were negative and your other work-up is reassuring.  As your shortness of breath was exacerbated by the cold air after leaving shopping stores, I suspect it is more of a reactive airway disease.  Please use the inhaler as we discussed with 2 puffs every 4-6 hours.  Please rest and stay hydrated.  If any symptoms change or worsen, please return to the nearest emergency department.

## 2018-12-27 NOTE — ED Provider Notes (Signed)
[facility] EMERGENCY DEPARTMENT Provider Note   CSN: [id number] Arrival date & time: 12/27/18  1419     History Chief Complaint  Patient presents with  . Shortness of Breath    DELORES EDELSTEIN is a 85 y.o. female.  The history is provided by the patient and medical records. No language interpreter was used.  Chest Pain Pain location:  Substernal area Pain quality: pressure   Pain radiates to:  Does not radiate Pain severity:  Moderate Duration:  1 week Timing:  Intermittent Progression:  Waxing and waning Chronicity:  New Context: breathing   Relieved by:  Nothing Worsened by:  Deep breathing Ineffective treatments:  None tried Associated symptoms: near-syncope and shortness of breath   Associated symptoms: no abdominal pain, no back pain, no cough, no diaphoresis, no dysphagia, no fatigue, no fever, no headache, no lower extremity edema, no nausea, no palpitations, no syncope, no vomiting and no weakness        Past Medical History:  Diagnosis Date  . Bell's palsy 2009;    on the right  . Brain TIA    pt denies this hx on 07/18/2013; "they thought it was a stroke but dr said later that it was Bell's Palsy"  . Cerebrovascular disease   . GERD (gastroesophageal reflux disease)   . Hepatitis    "don't remember what kind; had it ~ 3 times when I was younger; last time was in the 1990's"  . Hyperlipidemia   . Hypertension     Patient Active Problem List   Diagnosis Date Noted  . GERD (gastroesophageal reflux disease) 07/19/2013  . Urinary incontinence 07/19/2013  . Lung nodule seen on imaging study 07/19/2013  . Chest pain 07/18/2013  . Chest pressure 07/18/2013  . Benign hypertension 07/18/2013  . History of Bell's palsy 06/27/2013    Past Surgical History:  Procedure Laterality Date  . APPENDECTOMY  ~ 1971  . CHOLECYSTECTOMY  ~ 1971  . TONSILLECTOMY  1949     OB History   No obstetric history on file.     Family History    Problem Relation Age of Onset  . Pneumonia Mother        old age  . Heart attack Father   . Diabetes Father   . Diabetes Sister   . Diabetes Brother   . Cancer Sister   . Diabetes Sister   . Diabetes Sister     Social History   Tobacco Use  . Smoking status: Never Smoker  . Smokeless tobacco: Never Used  Substance Use Topics  . Alcohol use: No  . Drug use: No    Home Medications Prior to Admission medications   Medication Sig Start Date End Date Taking? Authorizing Provider  amLODipine (NORVASC) 5 MG tablet Take 5 mg by mouth daily.    [provider]  aspirin EC 325 MG tablet Take 1 tablet (325 mg total) by mouth daily. 07/19/13   Rai, Delene Ruffini, MD  benztropine (COGENTIN) 0.5 MG tablet Take 1 tablet (0.5 mg total) by mouth at bedtime. 06/27/13   York Spaniel, MD  fesoterodine (TOVIAZ) 4 MG TB24 tablet Take 1 tablet (4 mg total) by mouth daily. 07/19/13   Rai, Delene Ruffini, MD  losartan-hydrochlorothiazide (HYZAAR) 100-12.5 MG per tablet Take 1 tablet by mouth daily.    [provider]  nitroGLYCERIN (NITROSTAT) 0.4 MG SL tablet Place 1 tablet (0.4 mg total) under the tongue every 5 (five) minutes as needed  for chest pain. 07/19/13   Rai, Ripudeep Kirtland Bouchard, MD  pantoprazole (PROTONIX) 40 MG tablet Take 1 tablet (40 mg total) by mouth 2 (two) times daily before a meal. 07/19/13   Rai, Ripudeep K, MD  simvastatin (ZOCOR) 10 MG tablet Take 1 tablet (10 mg total) by mouth at bedtime. 07/19/13   Rai, Ripudeep K, MD  sucralfate (CARAFATE) 1 GM/10ML suspension Take 10 mLs (1 g total) by mouth 4 (four) times daily -  with meals and at bedtime. X 4 weeks 07/19/13   Rai, Delene Ruffini, MD  vitamin B-12 (CYANOCOBALAMIN) 500 MCG tablet Take 500 mcg by mouth daily.    [provider]    Allergies    Tessalon perles [benzonatate], Neurontin [gabapentin], and Zantac [ranitidine hcl]  Review of Systems   Review of Systems  Constitutional: Negative for chills, diaphoresis, fatigue  and fever.  HENT: Negative for congestion and trouble swallowing.   Eyes: Negative for visual disturbance.  Respiratory: Positive for chest tightness and shortness of breath. Negative for cough, wheezing and stridor.   Cardiovascular: Positive for chest pain and near-syncope. Negative for palpitations, leg swelling and syncope.  Gastrointestinal: Negative for abdominal pain, constipation, diarrhea, nausea and vomiting.  Genitourinary: Positive for frequency. Negative for dysuria and flank pain.  Musculoskeletal: Negative for back pain, neck pain and neck stiffness.  Skin: Negative for rash and wound.  Neurological: Negative for weakness, light-headedness and headaches.  Psychiatric/Behavioral: Negative for agitation.  All other systems reviewed and are negative.   Physical Exam Updated Vital Signs BP 137/75 (BP Location: Right Arm)   Pulse 63   Temp 98.8 F (37.1 C) (Oral)   Resp 12   SpO2 100%   Physical Exam Vitals and nursing note reviewed.  Constitutional:      General: She is not in acute distress.    Appearance: She is well-developed. She is not ill-appearing, toxic-appearing or diaphoretic.  HENT:     Head: Normocephalic and atraumatic.     Right Ear: External ear normal.     Left Ear: External ear normal.     Nose: Nose normal.     Mouth/Throat:     Pharynx: No pharyngeal swelling or oropharyngeal exudate.  Eyes:     Conjunctiva/sclera: Conjunctivae normal.     Pupils: Pupils are equal, round, and reactive to light.  Cardiovascular:     Rate and Rhythm: Normal rate.  Pulmonary:     Effort: Pulmonary effort is normal. No respiratory distress.     Breath sounds: No stridor. No decreased breath sounds, wheezing, rhonchi or rales.  Chest:     Chest wall: No tenderness.  Abdominal:     General: There is no distension.     Tenderness: There is no abdominal tenderness. There is no rebound.  Musculoskeletal:     Cervical back: Normal range of motion and neck supple.       Right lower leg: No tenderness. Edema (mild) present.     Left lower leg: No tenderness. Edema (mild) present.  Skin:    General: Skin is warm.     Capillary Refill: Capillary refill takes less than 2 seconds.     Findings: No erythema or rash.  Neurological:     General: No focal deficit present.     Mental Status: She is alert and oriented to person, place, and time.     Motor: No abnormal muscle tone.     Coordination: Coordination normal.     Deep Tendon Reflexes: Reflexes  are normal and symmetric.     ED Results / Procedures / Treatments   Labs (all labs ordered are listed, but only abnormal results are displayed) Labs Reviewed  BASIC METABOLIC PANEL - Abnormal; Notable for the following components:      Result Value   Glucose, Bld 105 (*)    Creatinine, Ser 1.08 (*)    GFR calc non Af Amer 46 (*)    GFR calc Af Amer 54 (*)    All other components within normal limits  D-DIMER, QUANTITATIVE (NOT AT [facility]) - Abnormal; Notable for the following components:   D-Dimer, Quant 0.85 (*)    All other components within normal limits  URINALYSIS, ROUTINE W REFLEX MICROSCOPIC - Abnormal; Notable for the following components:   Color, Urine STRAW (*)    Specific Gravity, Urine 1.003 (*)    All other components within normal limits  URINE CULTURE  CBC  BRAIN NATRIURETIC PEPTIDE  LIPASE, BLOOD  HEPATIC FUNCTION PANEL  POC SARS CORONAVIRUS 2 AG -  ED  POC SARS CORONAVIRUS 2 AG -  ED  TROPONIN I (HIGH SENSITIVITY)  TROPONIN I (HIGH SENSITIVITY)    EKG EKG Interpretation  Date/Time:  Thursday December 27 2018 14:37:07 EST Ventricular Rate:  86 PR Interval:  166 QRS Duration: 72 QT Interval:  368 QTC Calculation: 440 R Axis:   -54 Text Interpretation: Normal sinus rhythm Low voltage QRS Left anterior fascicular block Cannot rule out Anterior infarct , age undetermined Abnormal ECG Confirmed by Madalyn Rob [phone]) on 12/27/2018 3:41:51 PM   Radiology DG Chest 2  View  Result Date: 12/27/2018 CLINICAL DATA:  Chest discomfort EXAM: CHEST - 2 VIEW COMPARISON:  Radiograph 03/25/2018, CTA chest 07/18/2013 FINDINGS: Stable bandlike opacity in the left mid lung compatible with scarring. No consolidation, features of edema, pneumothorax, or effusion. The aorta is calcified. The remaining cardiomediastinal contours are unremarkable. No acute osseous or soft tissue abnormality. Degenerative changes are present in the imaged spine and shoulders. IMPRESSION: Stable scarring in the left mid lung. No acute cardiopulmonary abnormality. Aortic Atherosclerosis (ICD10-I70.0). Electronically Signed   By: Lovena Le M.D.   On: 12/27/2018 15:12   CT Angio Chest PE W and/or Wo Contrast  Result Date: 12/27/2018 CLINICAL DATA:  Multiple episodes of sudden onset shortness of breath EXAM: CT ANGIOGRAPHY CHEST WITH CONTRAST TECHNIQUE: Multidetector CT imaging of the chest was performed using the standard protocol during bolus administration of intravenous contrast. Multiplanar CT image reconstructions and MIPs were obtained to evaluate the vascular anatomy. CONTRAST:  161mL OMNIPAQUE IOHEXOL 350 MG/ML SOLN COMPARISON:  CT 07/18/2013, radiograph 12/27/2018 FINDINGS: Cardiovascular: Satisfactory opacification the pulmonary arteries to the segmental level. No pulmonary artery filling defects are identified. Central pulmonary arteries are normal caliber. Normal heart size. No pericardial effusion. Coronary artery calcifications are present. Normal caliber thoracic aorta with minimal atherosclerotic plaque. Normal 3 vessel branching of the arch. Minimal plaque in the proximal great vessels. Mediastinum/Nodes: No pathologically enlarged mediastinal, hilar or axillary adenopathy. Heterogeneous, enlarged thyroid gland extending through the thoracic inlet. No acute abnormality of the trachea or esophagus. Moderate hiatal hernia is noted. Lungs/Pleura: There is atelectatic change in lungs likely  accentuated by imaging during exhalation. More bandlike opacities likely reflect additional atelectasis and/or scarring most pronounced in the lingula. Mild airways thickening is noted. No consolidative opacity. No pneumothorax or effusion. Upper Abdomen: No acute abnormalities present in the visualized portions of the upper abdomen. Vascular calcifications seen in the upper abdomen. Musculoskeletal: Multilevel degenerative  changes are present in the imaged portions of the spine. Multilevel flowing anterior osteophytosis, compatible with features of diffuse idiopathic skeletal hyperostosis (DISH). Additional degenerative changes noted in both shoulders Review of the MIP images confirms the above findings. IMPRESSION: 1. Satisfactory opacification of the pulmonary arteries. No acute or chronic pulmonary emboli. 2. Atelectatic changes with more bandlike areas of subsegmental atelectasis or scarring in the lungs. 3. Enlarged, heterogeneous thyroid. Recommend outpatient thyroid ultrasound. This follows consensus guidelines: Managing Incidental Thyroid Nodules Detected on Imaging: White Paper of the ACR Incidental Thyroid Findings Committee. J Am Coll Radiol 2015; 12:143-150. and [facility] 3-tiered system for managing ITNs: J Am Coll Radiol. 2015; Feb;12(2): 143-50. 4. Coronary artery disease. 5. Aortic Atherosclerosis (ICD10-I70.0). 6. Moderate hiatal hernia. Electronically Signed   By: Kreg ShropshirePrice  DeHay M.D.   On: 12/27/2018 20:58    Procedures Procedures (including critical care time)  Medications Ordered in ED Medications  albuterol (VENTOLIN HFA) 108 (90 Base) MCG/ACT inhaler 2 puff (2 puffs Inhalation Given 12/27/18 1844)  iohexol (OMNIPAQUE) 350 MG/ML injection 100 mL (100 mLs Intravenous Contrast Given 12/27/18 2036)    ED Course  I have reviewed the triage vital signs and the nursing notes.  Pertinent labs & imaging results that were available during my care of the patient were reviewed by me and considered  in my medical decision making (see chart for details).    MDM Rules/Calculators/A&P                        Margot ChimesDorothy F Tenenbaum is a 85 y.o. female with a past medical history significant for hypertension, hyperlipidemia, prior TIA, and GERD who presents with chest pain and shortness of breath episodes.  She reports that for the last week she has had several episodes of chest tightness, chest pressure, and shortness of breath.  She reports that it has lasted for several minutes to an hour at a time but last night had an episode and she is still feeling somewhat short of breath and having chest pressure.  She describes it as moderate to mild.  She reports it is pleuritic but is not exertional.  She denies fevers, chills, congestion, or cough.  She does report some urinary frequency but denies dysuria.  She denies diaphoresis.  The pain does not radiate.  She denies any traumatic injuries.  She denies any new leg pain or leg swelling but thinks she may have had a remote history of blood clot in the past.  She does not member.  She does not have any other complaints and denies any Covid exposures although she does report she is been going to do shopping in the midst of these episodes of chest pain or shortness of breath.  On exam, lungs are clear and chest is nontender.  Abdomen is nontender.  Patient good pulses in upper and lower extremities.  Mild lower extremity edema seen but she reports this is at her baseline.  Oxygen saturations are 100% on room air and she is not tachycardic or hypotensive.  She is afebrile on arrival.  EKG showed no STEMI and appeared similar to prior.  No significant arrhythmia seen.  Clinically I suspect patient may have more sporadic reactive airway symptoms as she reports the shortness with episodes of worsened when she has been going outside after doing shopping.  Suspect the cold weather and temperatures caused vasospasm causing her shortness of breath episodes however this  does not fully explain the symptoms she  is still having.  Patient refused rapid or PCR Covid testing at this time reporting that she does not think that she has it and do not want to be checked for it.  She is amenable to continue work-up to rule out a cardiac or pulmonary etiology of her symptoms.  Patient will have a delta troponin as well as have a D-dimer added given the pleuritic symptoms.  Chest x-ray showed lung scarring unchanged from prior but no pneumonia.  No pneumothorax.  Due to the chest discomfort, will also add on lipase and hepatic function.  Will get urinalysis due to the frequency.  Anticipate reassessment after work-up.     6:36 PM Patient's laboratory testing again returned.  BNP not elevated.  Troponin negative x2.  D-dimer is 0.85.  With age adjustment, this is technically normal however after the further discussion with the patient, she agrees to get a CT PE study given her possible history of blood clot.  Will get a rapid Covid test which she will let us test.  Would prefer the PCR but she does not want a swab in the back of her nose.  Will give 2 puffs of albuterol to see if this helps her breathing as I suspect it is reactive bronchospasm from the cold air causing her shortness of breath primarily.  9:17 PM CT scan shows thyroid enlargement but no PE or pneumonia.  Atelectasis and scar tissue seen.  Patient was feeling better after inhaler.  Suspect the reactive airway disease with minimal wheezing due to the cold temperature air.  Given her reassuring work-up with negative troponins, negative PE study, and overall well appearance, we feel she is safe for discharge home.  Patient will follow up with her PCP and understood return precautions.  Her Covid test is also negative.  She did not want the PCR test.  Patient had no other questions or concerns and will be discharged in good condition.   Final Clinical Impression(s) / ED Diagnoses Final diagnoses:  Shortness of  breath  Thyroid enlargement    Rx / DC Orders ED Discharge Orders    None      Clinical Impression: 1. Shortness of breath   2. Thyroid enlargement     Disposition: Discharge  Condition: Good  I have discussed the results, Dx and Tx plan with the pt(& family if present). He/she/they expressed understanding and agree(s) with the plan. Discharge instructions discussed at great length. Strict return precautions discussed and pt &/or family have verbalized understanding of the instructions. No further questions at time of discharge.    New Prescriptions   No medications on file    Follow Up: Ernestine Conrad, MD [address] [place] [address] [phone]     [facility] EMERGENCY DEPARTMENT [address] [id number] mc [place] [address] [phone]       Angeligue Bowne, Canary Brim, MD 12/27/18 2120

## 2018-12-27 NOTE — ED Notes (Signed)
Patient verbalizes understanding of discharge instructions. Opportunity for questioning and answers were provided. Armband removed by staff, pt discharged from ED. Pt. ambulatory and discharged home.  

## 2018-12-27 NOTE — ED Triage Notes (Signed)
Pt endorses 3 episodes of sudden shob in the last week. Denies cough, fever chills or exposure. Has had some discomfort in her chest with the episodes. VSS.

## 2018-12-27 NOTE — ED Notes (Signed)
Pt ambulated to bathroom unassisted.

## 2018-12-28 ENCOUNTER — Encounter: Payer: Self-pay | Admitting: Cardiology

## 2018-12-28 ENCOUNTER — Ambulatory Visit (INDEPENDENT_AMBULATORY_CARE_PROVIDER_SITE_OTHER): Payer: Medicare Other | Admitting: Cardiology

## 2018-12-28 VITALS — BP 120/60 | HR 82 | Ht 62.0 in | Wt 184.0 lb

## 2018-12-28 DIAGNOSIS — R0789 Other chest pain: Secondary | ICD-10-CM

## 2018-12-28 DIAGNOSIS — E7800 Pure hypercholesterolemia, unspecified: Secondary | ICD-10-CM | POA: Diagnosis not present

## 2018-12-28 DIAGNOSIS — R0600 Dyspnea, unspecified: Secondary | ICD-10-CM

## 2018-12-28 DIAGNOSIS — I10 Essential (primary) hypertension: Secondary | ICD-10-CM

## 2018-12-28 DIAGNOSIS — R0609 Other forms of dyspnea: Secondary | ICD-10-CM

## 2018-12-28 NOTE — Progress Notes (Signed)
Cardiology Office Note:    Date:  12/28/2018   ID:  Margot Chimes, DOB 18-May-1930, MRN [id number]  PCP:  Ernestine Conrad, MD  Cardiologist:  No primary care provider on file.  Electrophysiologist:  None   Referring MD: Lawanna Kobus, MD   Chief Complaint  Patient presents with  . New Patient (Initial Visit)    Seen in ED at [facility] for chest tightness meds reviewed verbally with patient.     History of Present Illness:    SERRIA SLOMA is a 85 y.o. female with a hx of hypertension, hyperlipidemia presents with chest pain.  She states having episodes of chest tightness and shortness of breath over the past 3 weeks.  Patient states feeling shortness of breath with exertion.  Patient felt okay walking around the store.  When she came outside she suddenly felt short of breath.  Walking from the entrance of the hospital to the clinic today caused her to be short of breath.  Sitting down helps with her symptoms.  Shortness of breath is associated with chest pressure.  Patient's advised her to go to the emergency department yesterday due to persistent symptoms.  Patient was seen in the emergency department at [facility].  Work-up including high-sensitivity troponins was within normal limits x2, EKG showed normal sinus rhythm and no ST changes.  D-dimer was 0.85.  A CT angiogram of the chest was performed with no evidence of PE.  She was given a breathing treatment with suspicion for reactive airway disease.  Her symptoms improved and she was discharged from the emergency department.  Her echocardiogram in 2015 showed normal ejection fraction with impaired relaxation.  Past Medical History:  Diagnosis Date  . Bell's palsy 2009;    on the right  . Brain TIA    pt denies this hx on 07/18/2013; "they thought it was a stroke but dr said later that it was Bell's Palsy"  . Cerebrovascular disease   . GERD (gastroesophageal reflux disease)   . Hepatitis    "don't remember what kind; had it ~  3 times when I was younger; last time was in the 1990's"  . Hyperlipidemia   . Hypertension     Past Surgical History:  Procedure Laterality Date  . APPENDECTOMY  ~ 1971  . CHOLECYSTECTOMY  ~ 1971  . TONSILLECTOMY  1949    Current Medications: Current Meds  Medication Sig  . aspirin EC 325 MG tablet Take 1 tablet (325 mg total) by mouth daily.  . benztropine (COGENTIN) 0.5 MG tablet Take 1 tablet (0.5 mg total) by mouth at bedtime.  . fesoterodine (TOVIAZ) 4 MG TB24 tablet Take 1 tablet (4 mg total) by mouth daily.  Marland Kitchen losartan-hydrochlorothiazide (HYZAAR) 100-25 MG tablet Take 1 tablet by mouth daily.  . nitroGLYCERIN (NITROSTAT) 0.4 MG SL tablet Place 1 tablet (0.4 mg total) under the tongue every 5 (five) minutes as needed for chest pain.  . pantoprazole (PROTONIX) 40 MG tablet Take 1 tablet (40 mg total) by mouth 2 (two) times daily before a meal. (Patient taking differently: Take 40 mg by mouth daily. )  . simvastatin (ZOCOR) 10 MG tablet Take 1 tablet (10 mg total) by mouth at bedtime.  . sucralfate (CARAFATE) 1 GM/10ML suspension Take 10 mLs (1 g total) by mouth 4 (four) times daily -  with meals and at bedtime. X 4 weeks  . triamcinolone cream (KENALOG) 0.1 % Apply 1 application topically daily.  . vitamin B-12 (CYANOCOBALAMIN)  500 MCG tablet Take 500 mcg by mouth daily.     Allergies:   Tessalon perles [benzonatate], Neurontin [gabapentin], and Zantac [ranitidine hcl]   Social History   Socioeconomic History  . Marital status: Single    Spouse name: Not on file  . Number of children: 4  . Years of education: college-2  . Highest education level: Not on file  Occupational History  . Occupation: retired  . Occupation: Product/process development scientistprivate duty sitter  Tobacco Use  . Smoking status: Never Smoker  . Smokeless tobacco: Never Used  Substance and Sexual Activity  . Alcohol use: No  . Drug use: No  . Sexual activity: Never  Other Topics Concern  . Not on file  Social History  Narrative  . Not on file   Social Determinants of Health   Financial Resource Strain:   . Difficulty of Paying Living Expenses: Not on file  Food Insecurity:   . Worried About Programme researcher, broadcasting/film/videounning Out of Food in the Last Year: Not on file  . Ran Out of Food in the Last Year: Not on file  Transportation Needs:   . Lack of Transportation (Medical): Not on file  . Lack of Transportation (Non-Medical): Not on file  Physical Activity:   . Days of Exercise per Week: Not on file  . Minutes of Exercise per Session: Not on file  Stress:   . Feeling of Stress : Not on file  Social Connections:   . Frequency of Communication with Friends and Family: Not on file  . Frequency of Social Gatherings with Friends and Family: Not on file  . Attends Religious Services: Not on file  . Active Member of Clubs or Organizations: Not on file  . Attends BankerClub or Organization Meetings: Not on file  . Marital Status: Not on file     Family History: The patient's family history includes Cancer in her sister; Diabetes in her brother, father, sister, sister, and sister; Heart attack in her father; Pneumonia in her mother.  ROS:   Please see the history of present illness.     All other systems reviewed and are negative.  EKGs/Labs/Other Studies Reviewed:    The following studies were reviewed today: TTE 07/20/2018 Left ventricle: The cavity size was normal. Wall thickness was  normal. Systolic function was normal. The estimated ejection  fraction was in the range of 60% to 65%. Wall motion was normal;  there were no regional wall motion abnormalities. Doppler  parameters are consistent with abnormal left ventricular  relaxation (grade 1 diastolic dysfunction). Doppler parameters  are consistent with high ventricular filling pressure.  - Mitral valve: Calcified annulus.  - Left atrium: The atrium was moderately dilated.   EKG:  EKG is  ordered today.  The ekg ordered today demonstrates normal sinus  rhythm, left axis deviation.  Recent Labs: 12/27/2018: ALT 14; B Natriuretic Peptide 96.0; BUN 19; Creatinine, Ser 1.08; Hemoglobin 12.9; Platelets 213; Potassium 4.6; Sodium 138  Recent Lipid Panel    Component Value Date/Time   CHOL 184 07/19/2013 0730   TRIG 146 07/19/2013 0730   HDL 47 07/19/2013 0730   CHOLHDL 3.9 07/19/2013 0730   VLDL 29 07/19/2013 0730   LDLCALC 108 (H) 07/19/2013 0730    Physical Exam:    VS:  BP 120/60 (BP Location: Right Arm, Patient Position: Sitting, Cuff Size: Normal)   Pulse 82   Ht 5\' 2"  (1.575 m)   Wt 184 lb (83.5 kg)   SpO2 99%   BMI  33.65 kg/m     Wt Readings from Last 3 Encounters:  12/28/18 184 lb (83.5 kg)  07/19/13 185 lb 9.6 oz (84.2 kg)  06/27/13 184 lb 8 oz (83.7 kg)     GEN:  Well nourished, well developed in no acute distress HEENT: Normal NECK: No JVD; No carotid bruits LYMPHATICS: No lymphadenopathy CARDIAC: RRR, no murmurs, rubs, gallops RESPIRATORY:  Clear to auscultation without rales, wheezing or rhonchi  ABDOMEN: Soft, non-tender, non-distended MUSCULOSKELETAL:  No edema; No deformity  SKIN: Warm and dry NEUROLOGIC:  Alert and oriented x 3 PSYCHIATRIC:  Normal affect   ASSESSMENT:   Patient with complaint of dyspnea on exertion and chest tightness.  She has risk factors of hypertension, hyperlipidemia, age. 1. DOE (dyspnea on exertion)   2. Chest pressure   3. Essential hypertension   4. Pure hypercholesterolemia    PLAN:    In order of problems listed above:  1. Get echocardiogram, get Lexiscan myocardial perfusion imaging stress test. 2. Echo and stress test as above 3. Blood pressure well controlled continue current blood pressure meds. 4. Continue statin as currently prescribed.  Follow-up after above testing.  This note was generated in part or whole with voice recognition software. Voice recognition is usually quite accurate but there are transcription errors that can and very often do occur. I  apologize for any typographical errors that were not detected and corrected.  Medication Adjustments/Labs and Tests Ordered: Current medicines are reviewed at length with the patient today.  Concerns regarding medicines are outlined above.  Orders Placed This Encounter  Procedures  . NM Myocar Multi W/Spect W/Wall Motion / EF  . EKG 12-Lead  . ECHOCARDIOGRAM COMPLETE   No orders of the defined types were placed in this encounter.   Patient Instructions  Medication Instructions:  No changes  *If you need a refill on your cardiac medications before your next appointment, please call your pharmacy*  Lab Work: None  If you have labs (blood work) drawn today and your tests are completely normal, you will receive your results only by: Marland Kitchen MyChart Message (if you have MyChart) OR . A paper copy in the mail If you have any lab test that is abnormal or we need to change your treatment, we will call you to review the results.  Testing/Procedures: Your physician has requested that you have an echocardiogram. Echocardiography is a painless test that uses sound waves to create images of your heart. It provides your doctor with information about the size and shape of your heart and how well your heart's chambers and valves are working. This procedure takes approximately one hour. There are no restrictions for this procedure.  [place]  Your caregiver has ordered a Stress Test with nuclear imaging. The purpose of this test is to evaluate the blood supply to your heart muscle. This procedure is referred to as a "Non-Invasive Stress Test." This is because other than having an IV started in your vein, nothing is inserted or "invades" your body. Cardiac stress tests are done to find areas of poor blood flow to the heart by determining the extent of coronary artery disease (CAD). Some patients exercise on a treadmill, which naturally increases the blood flow to your heart, while others who are  unable  to walk on a treadmill due to physical limitations have a pharmacologic/chemical stress agent called Lexiscan . This medicine will mimic walking on a treadmill by temporarily increasing your coronary blood flow.   Please note: these  test may take anywhere between 2-4 hours to complete  PLEASE REPORT TO [facility] MEDICAL MALL ENTRANCE  THE VOLUNTEERS AT THE FIRST DESK WILL DIRECT YOU WHERE TO GO  Date of Procedure:_____________________________________  Arrival Time for Procedure:______________________________    PLEASE NOTIFY THE OFFICE AT LEAST 24 HOURS IN ADVANCE IF YOU ARE UNABLE TO KEEP YOUR APPOINTMENT.  [phone] AND  PLEASE NOTIFY NUCLEAR MEDICINE AT [facility] AT LEAST 24 HOURS IN ADVANCE IF YOU ARE UNABLE TO KEEP YOUR APPOINTMENT. [phone]  How to prepare for your Myoview test:  1. Do not eat or drink after midnight 2. No caffeine for 24 hours prior to test 3. No smoking 24 hours prior to test. 4. Your medication may be taken with water.  If your doctor stopped a medication because of this test, do not take that medication. 5. Ladies, please do not wear dresses.  Skirts or pants are appropriate. Please wear a short sleeve shirt. 6. No perfume, cologne or lotion. 7. Wear comfortable walking shoes. No heels!    Follow-Up: At [facility], you and your health needs are our priority.  As part of our continuing mission to provide you with exceptional heart care, we have created designated Provider Care Teams.  These Care Teams include your primary Cardiologist (physician) and Advanced Practice Providers (APPs -  Physician Assistants and Nurse Practitioners) who all work together to provide you with the care you need, when you need it.  Your next appointment:    Follow up after all testing.    The format for your next appointment:   In Person  Provider:    You may see Dr. Azucena Cecil or one of the following Advanced Practice Providers on your designated Care Team:     Nicolasa Ducking, NP  Eula Listen, PA-C  Marisue Ivan, PA-C        Signed, Debbe Odea, MD  12/28/2018 5:12 PM    [facility]

## 2018-12-28 NOTE — Patient Instructions (Addendum)
Medication Instructions:  No changes  *If you need a refill on your cardiac medications before your next appointment, please call your pharmacy*  Lab Work: None  If you have labs (blood work) drawn today and your tests are completely normal, you will receive your results only by: Marland Kitchen MyChart Message (if you have MyChart) OR . A paper copy in the mail If you have any lab test that is abnormal or we need to change your treatment, we will call you to review the results.  Testing/Procedures: Your physician has requested that you have an echocardiogram. Echocardiography is a painless test that uses sound waves to create images of your heart. It provides your doctor with information about the size and shape of your heart and how well your heart's chambers and valves are working. This procedure takes approximately one hour. There are no restrictions for this procedure.  Kalaya Infantino  Your caregiver has ordered a Stress Test with nuclear imaging. The purpose of this test is to evaluate the blood supply to your heart muscle. This procedure is referred to as a "Non-Invasive Stress Test." This is because other than having an IV started in your vein, nothing is inserted or "invades" your body. Cardiac stress tests are done to find areas of poor blood flow to the heart by determining the extent of coronary artery disease (CAD). Some patients exercise on a treadmill, which naturally increases the blood flow to your heart, while others who are  unable to walk on a treadmill due to physical limitations have a pharmacologic/chemical stress agent called Lexiscan . This medicine will mimic walking on a treadmill by temporarily increasing your coronary blood flow.   Please note: these test may take anywhere between 2-4 hours to complete  PLEASE REPORT TO [facility] AT THE FIRST DESK WILL DIRECT YOU WHERE TO GO  Date of Procedure:_____________________________________  Arrival Time  for Procedure:______________________________    PLEASE NOTIFY THE OFFICE AT LEAST 24 HOURS IN ADVANCE IF YOU ARE UNABLE TO KEEP YOUR APPOINTMENT.  [phone] AND  PLEASE NOTIFY NUCLEAR MEDICINE AT [facility] AT LEAST 24 HOURS IN ADVANCE IF YOU ARE UNABLE TO KEEP YOUR APPOINTMENT. [phone]  How to prepare for your Myoview test:  1. Do not eat or drink after midnight 2. No caffeine for 24 hours prior to test 3. No smoking 24 hours prior to test. 4. Your medication may be taken with water.  If your doctor stopped a medication because of this test, do not take that medication. 5. Ladies, please do not wear dresses.  Skirts or pants are appropriate. Please wear a short sleeve shirt. 6. No perfume, cologne or lotion. 7. Wear comfortable walking shoes. No heels!    Follow-Up: At [facility], you and your health needs are our priority.  As part of our continuing mission to provide you with exceptional heart care, we have created designated Provider Care Teams.  These Care Teams include your primary Cardiologist (physician) and Advanced Practice Providers (APPs -  Physician Assistants and Nurse Practitioners) who all work together to provide you with the care you need, when you need it.  Your next appointment:    Follow up after all testing.    The format for your next appointment:   In Person  Provider:    You may see Dr. Garen Lah or one of the following Advanced Practice Providers on your designated Care Team:    Murray Hodgkins, NP  Christell Faith, PA-C  Malachi Bonds  Michaelle Birks, PA-C

## 2018-12-29 LAB — URINE CULTURE: Culture: >=100000 — AB

## 2018-12-30 ENCOUNTER — Telehealth: Payer: Self-pay | Admitting: Emergency Medicine

## 2018-12-30 NOTE — Telephone Encounter (Signed)
Post ED Visit - Positive Culture Follow-up: Unsuccessful Patient Follow-up  Culture assessed and recommendations reviewed by:  []  Elenor Quinones, Pharm.D. []  Heide Guile, Pharm.D., BCPS AQ-ID []  Parks Neptune, Pharm.D., BCPS []  Alycia Rossetti, Pharm.D., BCPS []  McIntyre, Pharm.D., BCPS, AAHIVP []  Legrand Como, Pharm.D., BCPS, AAHIVP [x]  Duanne Limerick, PharmD []  Vincenza Hews, PharmD, BCPS  Positive urine culture  [x]  Patient discharged without antimicrobial prescription and treatment is now indicated []  Organism is resistant to prescribed ED discharge antimicrobial []  Patient with positive blood cultures   Unable to contact patient at phone number on file, letter will be sent to address on file  Plan: start Augmentin 500 mg PO BID x five days - Shawn Joy PA   Danielle Colon 12/30/2018, 3:44 PM

## 2018-12-31 ENCOUNTER — Telehealth: Payer: Self-pay

## 2018-12-31 NOTE — Telephone Encounter (Signed)
Patient needs fu after testing .  Attempted to contact.  No ans

## 2019-01-01 ENCOUNTER — Telehealth: Payer: Self-pay | Admitting: Cardiology

## 2019-01-01 NOTE — Telephone Encounter (Signed)
Patients daughter is calling to transfer care from Dr. Garen Lah in [place] to Dr. Meda Coffee at our [facility] in [place].

## 2019-01-01 NOTE — Telephone Encounter (Signed)
Ok with me 

## 2019-01-17 NOTE — Telephone Encounter (Signed)
Unable to reach.

## 2019-01-23 ENCOUNTER — Other Ambulatory Visit: Payer: Medicare Other

## 2019-01-31 ENCOUNTER — Ambulatory Visit: Payer: Medicare Other | Admitting: Cardiology

## 2019-02-28 ENCOUNTER — Inpatient Hospital Stay (HOSPITAL_COMMUNITY)
Admission: EM | Admit: 2019-02-28 | Discharge: 2019-03-05 | DRG: 440 | Disposition: A | Discharge status: Home Health | Payer: Medicare Other | Attending: Internal Medicine | Admitting: Internal Medicine

## 2019-02-28 ENCOUNTER — Emergency Department (HOSPITAL_COMMUNITY): Payer: Medicare Other

## 2019-02-28 ENCOUNTER — Encounter (HOSPITAL_COMMUNITY): Payer: Self-pay | Admitting: Radiology

## 2019-02-28 ENCOUNTER — Other Ambulatory Visit: Payer: Self-pay

## 2019-02-28 DIAGNOSIS — Z8249 Family history of ischemic heart disease and other diseases of the circulatory system: Secondary | ICD-10-CM | POA: Diagnosis not present

## 2019-02-28 DIAGNOSIS — D649 Anemia, unspecified: Secondary | ICD-10-CM | POA: Diagnosis present

## 2019-02-28 DIAGNOSIS — M19072 Primary osteoarthritis, left ankle and foot: Secondary | ICD-10-CM | POA: Diagnosis present

## 2019-02-28 DIAGNOSIS — R519 Headache, unspecified: Secondary | ICD-10-CM | POA: Diagnosis not present

## 2019-02-28 DIAGNOSIS — M11272 Other chondrocalcinosis, left ankle and foot: Secondary | ICD-10-CM | POA: Diagnosis present

## 2019-02-28 DIAGNOSIS — Z6834 Body mass index (BMI) 34.0-34.9, adult: Secondary | ICD-10-CM | POA: Diagnosis not present

## 2019-02-28 DIAGNOSIS — M79609 Pain in unspecified limb: Secondary | ICD-10-CM | POA: Diagnosis not present

## 2019-02-28 DIAGNOSIS — M11271 Other chondrocalcinosis, right ankle and foot: Secondary | ICD-10-CM | POA: Diagnosis present

## 2019-02-28 DIAGNOSIS — Z79899 Other long term (current) drug therapy: Secondary | ICD-10-CM | POA: Diagnosis not present

## 2019-02-28 DIAGNOSIS — R0902 Hypoxemia: Secondary | ICD-10-CM | POA: Diagnosis present

## 2019-02-28 DIAGNOSIS — E876 Hypokalemia: Secondary | ICD-10-CM | POA: Diagnosis present

## 2019-02-28 DIAGNOSIS — Z20822 Contact with and (suspected) exposure to covid-19: Secondary | ICD-10-CM | POA: Diagnosis present

## 2019-02-28 DIAGNOSIS — E669 Obesity, unspecified: Secondary | ICD-10-CM | POA: Diagnosis present

## 2019-02-28 DIAGNOSIS — M79669 Pain in unspecified lower leg: Secondary | ICD-10-CM

## 2019-02-28 DIAGNOSIS — I10 Essential (primary) hypertension: Secondary | ICD-10-CM | POA: Diagnosis not present

## 2019-02-28 DIAGNOSIS — I69392 Facial weakness following cerebral infarction: Secondary | ICD-10-CM | POA: Diagnosis not present

## 2019-02-28 DIAGNOSIS — M19071 Primary osteoarthritis, right ankle and foot: Secondary | ICD-10-CM | POA: Diagnosis present

## 2019-02-28 DIAGNOSIS — R112 Nausea with vomiting, unspecified: Secondary | ICD-10-CM | POA: Diagnosis not present

## 2019-02-28 DIAGNOSIS — K859 Acute pancreatitis without necrosis or infection, unspecified: Secondary | ICD-10-CM | POA: Diagnosis not present

## 2019-02-28 DIAGNOSIS — Z888 Allergy status to other drugs, medicaments and biological substances status: Secondary | ICD-10-CM

## 2019-02-28 DIAGNOSIS — Z9049 Acquired absence of other specified parts of digestive tract: Secondary | ICD-10-CM | POA: Diagnosis not present

## 2019-02-28 DIAGNOSIS — Z833 Family history of diabetes mellitus: Secondary | ICD-10-CM

## 2019-02-28 DIAGNOSIS — N289 Disorder of kidney and ureter, unspecified: Secondary | ICD-10-CM | POA: Diagnosis not present

## 2019-02-28 DIAGNOSIS — Z7982 Long term (current) use of aspirin: Secondary | ICD-10-CM | POA: Diagnosis not present

## 2019-02-28 DIAGNOSIS — E785 Hyperlipidemia, unspecified: Secondary | ICD-10-CM | POA: Diagnosis not present

## 2019-02-28 DIAGNOSIS — K219 Gastro-esophageal reflux disease without esophagitis: Secondary | ICD-10-CM | POA: Diagnosis not present

## 2019-02-28 DIAGNOSIS — M79671 Pain in right foot: Secondary | ICD-10-CM

## 2019-02-28 HISTORY — DX: Acute pancreatitis without necrosis or infection, unspecified: K85.90

## 2019-02-28 LAB — COMPREHENSIVE METABOLIC PANEL
ALT: 10 U/L (ref 0–44)
AST: 27 U/L (ref 15–41)
Albumin: 3.9 g/dL (ref 3.5–5.0)
Alkaline Phosphatase: 64 U/L (ref 38–126)
Anion gap: 18 — ABNORMAL HIGH (ref 5–15)
BUN: 23 mg/dL (ref 8–23)
CO2: 23 mmol/L (ref 22–32)
Calcium: 9.6 mg/dL (ref 8.9–10.3)
Chloride: 98 mmol/L (ref 98–111)
Creatinine, Ser: 1.2 mg/dL — ABNORMAL HIGH (ref 0.44–1.00)
GFR calc Af Amer: 47 mL/min — ABNORMAL LOW (ref >60)
GFR calc non Af Amer: 41 mL/min — ABNORMAL LOW (ref >60)
Glucose, Bld: 119 mg/dL — ABNORMAL HIGH (ref 70–99)
Potassium: 4.6 mmol/L (ref 3.5–5.1)
Sodium: 139 mmol/L (ref 135–145)
Total Bilirubin: 0.7 mg/dL (ref 0.3–1.2)
Total Protein: 7 g/dL (ref 6.5–8.1)

## 2019-02-28 LAB — URINALYSIS, ROUTINE W REFLEX MICROSCOPIC
Bilirubin Urine: NEGATIVE
Glucose, UA: NEGATIVE mg/dL
Hgb urine dipstick: NEGATIVE
Ketones, ur: NEGATIVE mg/dL
Leukocytes,Ua: NEGATIVE
Nitrite: NEGATIVE
Protein, ur: NEGATIVE mg/dL
Specific Gravity, Urine: 1.013 (ref 1.005–1.030)
pH: 6 (ref 5.0–8.0)

## 2019-02-28 LAB — CBC WITH DIFFERENTIAL/PLATELET
Abs Immature Granulocytes: 0.02 10*3/uL (ref 0.00–0.07)
Basophils Absolute: 0.1 10*3/uL (ref 0.0–0.1)
Basophils Relative: 1 %
Eosinophils Absolute: 0.4 10*3/uL (ref 0.0–0.5)
Eosinophils Relative: 4 %
HCT: 45.2 % (ref 36.0–46.0)
Hemoglobin: 14.1 g/dL (ref 12.0–15.0)
Immature Granulocytes: 0 %
Lymphocytes Relative: 23 %
Lymphs Abs: 2.2 10*3/uL (ref 0.7–4.0)
MCH: 30.3 pg (ref 26.0–34.0)
MCHC: 31.2 g/dL (ref 30.0–36.0)
MCV: 97.2 fL (ref 80.0–100.0)
Monocytes Absolute: 0.9 10*3/uL (ref 0.1–1.0)
Monocytes Relative: 9 %
Neutro Abs: 5.8 10*3/uL (ref 1.7–7.7)
Neutrophils Relative %: 63 %
Platelets: 161 10*3/uL (ref 150–400)
RBC: 4.65 MIL/uL (ref 3.87–5.11)
RDW: 12.7 % (ref 11.5–15.5)
WBC: 9.4 10*3/uL (ref 4.0–10.5)
nRBC: 0 % (ref 0.0–0.2)

## 2019-02-28 LAB — LIPID PANEL
Cholesterol: 237 mg/dL — ABNORMAL HIGH (ref 0–200)
HDL: 58 mg/dL (ref >40)
LDL Cholesterol: 165 mg/dL — ABNORMAL HIGH (ref 0–99)
Total CHOL/HDL Ratio: 4.1 RATIO
Triglycerides: 68 mg/dL (ref <150)
VLDL: 14 mg/dL (ref 0–40)

## 2019-02-28 LAB — SARS CORONAVIRUS 2 (TAT 6-24 HRS): SARS Coronavirus 2: NEGATIVE

## 2019-02-28 LAB — LIPASE, BLOOD: Lipase: 2756 U/L — ABNORMAL HIGH (ref 11–51)

## 2019-02-28 MED ORDER — FENTANYL CITRATE (PF) 100 MCG/2ML IJ SOLN
12.5000 ug | INTRAMUSCULAR | Status: DC | PRN
  Administered 2019-03-04: 25 ug via INTRAVENOUS
  Filled 2019-02-28: qty 2

## 2019-02-28 MED ORDER — SODIUM CHLORIDE 0.9 % IV BOLUS
500.0000 mL | Freq: Once | INTRAVENOUS | Status: AC
  Administered 2019-02-28: 500 mL via INTRAVENOUS

## 2019-02-28 MED ORDER — SODIUM CHLORIDE 0.9 % IV SOLN: INTRAVENOUS | Status: DC

## 2019-02-28 MED ORDER — KETOROLAC TROMETHAMINE 0.5 % OP SOLN
1.0000 [drp] | Freq: Every day | OPHTHALMIC | Status: DC
  Administered 2019-02-28 – 2019-03-01 (×2): 1 [drp] via OPHTHALMIC
  Filled 2019-02-28: qty 5

## 2019-02-28 MED ORDER — ACETAMINOPHEN 650 MG RE SUPP: 650.0000 mg | Freq: Four times a day (QID) | RECTAL | Status: DC | PRN

## 2019-02-28 MED ORDER — IOHEXOL 300 MG/ML  SOLN
80.0000 mL | Freq: Once | INTRAMUSCULAR | Status: AC | PRN
  Administered 2019-02-28: 80 mL via INTRAVENOUS

## 2019-02-28 MED ORDER — ONDANSETRON HCL 4 MG PO TABS: 4.0000 mg | ORAL_TABLET | Freq: Four times a day (QID) | ORAL | Status: DC | PRN

## 2019-02-28 MED ORDER — ONDANSETRON HCL 4 MG/2ML IJ SOLN
4.0000 mg | Freq: Once | INTRAMUSCULAR | Status: AC
  Administered 2019-02-28: 4 mg via INTRAVENOUS
  Filled 2019-02-28: qty 2

## 2019-02-28 MED ORDER — ALBUTEROL SULFATE (2.5 MG/3ML) 0.083% IN NEBU: 2.5000 mg | INHALATION_SOLUTION | Freq: Four times a day (QID) | RESPIRATORY_TRACT | Status: DC | PRN

## 2019-02-28 MED ORDER — ONDANSETRON HCL 4 MG/2ML IJ SOLN: 4.0000 mg | Freq: Four times a day (QID) | INTRAMUSCULAR | Status: DC | PRN

## 2019-02-28 MED ORDER — ACETAMINOPHEN 325 MG PO TABS
650.0000 mg | ORAL_TABLET | Freq: Four times a day (QID) | ORAL | Status: DC | PRN
  Administered 2019-02-28 – 2019-03-05 (×9): 650 mg via ORAL
  Filled 2019-02-28 (×9): qty 2

## 2019-02-28 MED ORDER — PANTOPRAZOLE SODIUM 40 MG PO TBEC
40.0000 mg | DELAYED_RELEASE_TABLET | Freq: Every day | ORAL | Status: DC
  Administered 2019-02-28 – 2019-03-05 (×6): 40 mg via ORAL
  Filled 2019-02-28 (×6): qty 1

## 2019-02-28 MED ORDER — ASPIRIN EC 81 MG PO TBEC
81.0000 mg | DELAYED_RELEASE_TABLET | Freq: Every day | ORAL | Status: DC
  Administered 2019-02-28 – 2019-03-05 (×6): 81 mg via ORAL
  Filled 2019-02-28 (×6): qty 1

## 2019-02-28 MED ORDER — SODIUM CHLORIDE 0.9% FLUSH
3.0000 mL | Freq: Two times a day (BID) | INTRAVENOUS | Status: DC
  Administered 2019-03-02 – 2019-03-04 (×6): 3 mL via INTRAVENOUS

## 2019-02-28 MED ORDER — FENTANYL CITRATE (PF) 100 MCG/2ML IJ SOLN
50.0000 ug | Freq: Once | INTRAMUSCULAR | Status: AC
  Administered 2019-02-28: 50 ug via INTRAVENOUS
  Filled 2019-02-28: qty 2

## 2019-02-28 MED ORDER — ENOXAPARIN SODIUM 40 MG/0.4ML ~~LOC~~ SOLN
40.0000 mg | SUBCUTANEOUS | Status: DC
  Administered 2019-02-28 – 2019-03-05 (×6): 40 mg via SUBCUTANEOUS
  Filled 2019-02-28 (×8): qty 0.4

## 2019-02-28 NOTE — ED Notes (Signed)
Danielle Colon daughter [phone] looking for an update

## 2019-02-28 NOTE — ED Notes (Signed)
Off to CT

## 2019-02-28 NOTE — ED Triage Notes (Signed)
Came in with [facility], reportedly been having abdominal pain today after eating dairy products. Has hx of pancreatitis, and HTN. Had nausea and vomiting en route to the hospital. Last BP reading was 180/110, and had a CBG of 140.

## 2019-02-28 NOTE — ED Provider Notes (Signed)
[facility] EMERGENCY DEPARTMENT Provider Note   CSN: [id number] Arrival date & time: 02/28/19  5956     History Chief Complaint  Patient presents with   Abdominal Pain    Danielle Colon is a 86 y.o. female with a hx of TIA, GERD, hepatitis, hypertension, hyperlipidemia, pancreatitis presents to the Emergency Department complaining of gradual, persistent, progressively worsening epigastric abdominal pain onset 2 hours prior to arrival. Patient reports the pain in her upper abdomen woke her from sleep. She reports nausea and vomiting in route to the hospital which improved her pain initially but it has begun to return. She denies fevers or chills, headache or neck pain, chest pain, shortness of breath, diarrhea, weakness, dizziness, syncope. Patient reports she is not anticoagulated and had no hematemesis. She denies melena or hematochezia. Patient reports she has a history of pancreatitis and pain today feels the same. Patient reports her last pancreatitis flare was October 2019 and she was admitted for 4 days at [facility]. Patient reports she drank sweet tea today and ate a [place] cheese steak sandwich which she thinks triggered this.   The history is provided by the patient and medical records. No language interpreter was used.       Past Medical History:  Diagnosis Date   Bell's palsy 2009;    on the right   Brain TIA    pt denies this hx on 07/18/2013; "they thought it was a stroke but dr said later that it was Bell's Palsy"   Cerebrovascular disease    GERD (gastroesophageal reflux disease)    Hepatitis    "don't remember what kind; had it ~ 3 times when I was younger; last time was in the 1990's"   Hyperlipidemia    Hypertension     Patient Active Problem List   Diagnosis Date Noted   GERD (gastroesophageal reflux disease) 07/19/2013   Urinary incontinence 07/19/2013   Lung nodule seen on imaging study 07/19/2013   Chest pain  07/18/2013   Chest pressure 07/18/2013   Benign hypertension 07/18/2013   History of Bell's palsy 06/27/2013    Past Surgical History:  Procedure Laterality Date   APPENDECTOMY  ~ 1971   CHOLECYSTECTOMY  ~ 1971   TONSILLECTOMY  1949     OB History   No obstetric history on file.     Family History  Problem Relation Age of Onset   Pneumonia Mother        old age   Heart attack Father    Diabetes Father    Diabetes Sister    Diabetes Brother    Cancer Sister    Diabetes Sister    Diabetes Sister     Social History   Tobacco Use   Smoking status: Never Smoker   Smokeless tobacco: Never Used  Substance Use Topics   Alcohol use: No   Drug use: No    Home Medications Prior to Admission medications   Medication Sig Start Date End Date Taking? Authorizing Provider  aspirin EC 325 MG tablet Take 1 tablet (325 mg total) by mouth daily. 07/19/13   Rai, Delene Ruffini, MD  benztropine (COGENTIN) 0.5 MG tablet Take 1 tablet (0.5 mg total) by mouth at bedtime. 06/27/13   York Spaniel, MD  fesoterodine (TOVIAZ) 4 MG TB24 tablet Take 1 tablet (4 mg total) by mouth daily. 07/19/13   Rai, Delene Ruffini, MD  losartan-hydrochlorothiazide (HYZAAR) 100-25 MG tablet Take 1 tablet by mouth daily. 10/22/18  [provider]  nitroGLYCERIN (NITROSTAT) 0.4 MG SL tablet Place 1 tablet (0.4 mg total) under the tongue every 5 (five) minutes as needed for chest pain. 07/19/13   Rai, Ripudeep K, MD  pantoprazole (PROTONIX) 40 MG tablet Take 1 tablet (40 mg total) by mouth 2 (two) times daily before a meal. Patient taking differently: Take 40 mg by mouth daily.  07/19/13   Rai, Vernelle Emerald, MD  simvastatin (ZOCOR) 10 MG tablet Take 1 tablet (10 mg total) by mouth at bedtime. 07/19/13   Rai, Ripudeep K, MD  sucralfate (CARAFATE) 1 GM/10ML suspension Take 10 mLs (1 g total) by mouth 4 (four) times daily -  with meals and at bedtime. X 4 weeks 07/19/13   Rai, Ripudeep K, MD  triamcinolone  cream (KENALOG) 0.1 % Apply 1 application topically daily. 12/25/18   [provider]  vitamin B-12 (CYANOCOBALAMIN) 500 MCG tablet Take 500 mcg by mouth daily.    [provider]    Allergies    Tessalon perles [benzonatate], Neurontin [gabapentin], and Zantac [ranitidine hcl]  Review of Systems   Review of Systems  Constitutional: Negative for appetite change, diaphoresis, fatigue, fever and unexpected weight change.  HENT: Negative for mouth sores.   Eyes: Negative for visual disturbance.  Respiratory: Negative for cough, chest tightness, shortness of breath and wheezing.   Cardiovascular: Negative for chest pain.  Gastrointestinal: Positive for abdominal pain, nausea and vomiting. Negative for constipation and diarrhea.  Endocrine: Negative for polydipsia, polyphagia and polyuria.  Genitourinary: Negative for dysuria, frequency, hematuria and urgency.  Musculoskeletal: Negative for back pain and neck stiffness.  Skin: Negative for rash.  Allergic/Immunologic: Negative for immunocompromised state.  Neurological: Negative for syncope, light-headedness and headaches.  Hematological: Does not bruise/bleed easily.  Psychiatric/Behavioral: Negative for sleep disturbance. The patient is not nervous/anxious.     Physical Exam Updated Vital Signs BP 131/70    Pulse 80    Resp 15    SpO2 99% Comment: @2L  O2  Physical Exam Vitals and nursing note reviewed.  Constitutional:      General: She is not in acute distress.    Appearance: She is not diaphoretic.  HENT:     Head: Normocephalic.  Eyes:     General: No scleral icterus.    Conjunctiva/sclera: Conjunctivae normal.  Cardiovascular:     Rate and Rhythm: Normal rate and regular rhythm.     Pulses: Normal pulses.          Radial pulses are 2+ on the right side and 2+ on the left side.  Pulmonary:     Effort: No tachypnea, accessory muscle usage, prolonged expiration, respiratory distress or retractions.      Breath sounds: No stridor.     Comments: Equal chest rise. No increased work of breathing. Abdominal:     General: There is no distension.     Palpations: Abdomen is soft.     Tenderness: There is abdominal tenderness in the epigastric area. There is guarding. There is no right CVA tenderness, left CVA tenderness or rebound.  Musculoskeletal:     Cervical back: Normal range of motion.     Comments: Moves all extremities equally and without difficulty.  Skin:    General: Skin is warm and dry.     Capillary Refill: Capillary refill takes less than 2 seconds.  Neurological:     Mental Status: She is alert.     GCS: GCS eye subscore is 4. GCS verbal subscore is 5.  GCS motor subscore is 6.     Comments: Speech is clear and goal oriented.  Psychiatric:        Mood and Affect: Mood normal.     ED Results / Procedures / Treatments   Labs (all labs ordered are listed, but only abnormal results are displayed) Labs Reviewed  COMPREHENSIVE METABOLIC PANEL - Abnormal; Notable for the following components:      Result Value   Glucose, Bld 119 (*)    Creatinine, Ser 1.20 (*)    GFR calc non Af Amer 41 (*)    GFR calc Af Amer 47 (*)    Anion gap 18 (*)    All other components within normal limits  LIPASE, BLOOD - Abnormal; Notable for the following components:   Lipase 2,756 (*)    All other components within normal limits  SARS CORONAVIRUS 2 (TAT 6-24 HRS)  CBC WITH DIFFERENTIAL/PLATELET  URINALYSIS, ROUTINE W REFLEX MICROSCOPIC    EKG EKG Interpretation  Date/Time:  Thursday February 28 2019 04:43:22 EST Ventricular Rate:  81 PR Interval:    QRS Duration: 85 QT Interval:  404 QTC Calculation: 469 R Axis:   -43 Text Interpretation: Sinus rhythm Prolonged PR interval Left axis deviation Low voltage, precordial leads Consider anterior infarct No significant change since last tracing Confirmed by Zadie Rhine ([id number]) on 02/28/2019 4:50:08 AM   EKG  Interpretation  Date/Time:  Thursday February 28 2019 04:43:22 EST Ventricular Rate:  81 PR Interval:    QRS Duration: 85 QT Interval:  404 QTC Calculation: 469 R Axis:   -43 Text Interpretation: Sinus rhythm Prolonged PR interval Left axis deviation Low voltage, precordial leads Consider anterior infarct No significant change since last tracing Confirmed by Zadie Rhine ([id number]) on 02/28/2019 4:50:08 AM        Radiology CT ABDOMEN PELVIS W CONTRAST  Result Date: 02/28/2019 CLINICAL DATA:  Abdominal pain that began earlier tonight. History of pancreatitis EXAM: CT ABDOMEN AND PELVIS WITH CONTRAST TECHNIQUE: Multidetector CT imaging of the abdomen and pelvis was performed using the standard protocol following bolus administration of intravenous contrast. CONTRAST:  33mL OMNIPAQUE IOHEXOL 300 MG/ML  SOLN COMPARISON:  04/28/2005 FINDINGS: Lower chest: Small sliding hiatal hernia with gastroesophageal poor clearance or reflux. Mild atelectatic type opacity at the bases. Hepatobiliary: Prominent intrahepatic bile duct dimensions. No common bile duct dilatation. No visible calcified stone.No focal liver lesion. Pancreas: Diffuse expansion and peripancreatic edema without necrosis or collection seen. Suspect pancreas divisum. A small cystic dilatation is seen at the level of the distal main pancreatic duct. Spleen: Unremarkable. Adrenals/Urinary Tract: Negative adrenals. No hydronephrosis or stone. Small left renal cystic intensity. Unremarkable bladder. Stomach/Bowel: No primary bowel inflammation. There is moderate distension of the stomach. Mid duodenal diverticulum is seen. Vascular/Lymphatic: No acute vascular abnormality. No mass or adenopathy. Reproductive:No pathologic findings. Other: No ascites or pneumoperitoneum. Musculoskeletal: Facet degeneration in the lower lumbar spine with L5-S1 anterolisthesis. IMPRESSION: 1. Acute edematous pancreatitis without organized collection. There may be  pancreas divisum. 2. Prominent intrahepatic bile ducts without common duct dilatation or calcified choledocholithiasis. 3. Fluid-filled stomach and small hiatal hernia. Electronically Signed   By: Marnee Spring M.D.   On: 02/28/2019 05:31   DG Chest Port 1 View  Result Date: 02/28/2019 CLINICAL DATA:  Epigastric abdominal pain EXAM: PORTABLE CHEST 1 VIEW COMPARISON:  CTA chest 12/27/2018, chest radiograph 12/27/2018 FINDINGS: Stable bandlike area of scarring in the left mid lung. No consolidation, features of edema, pneumothorax, or effusion. The cardiomediastinal  contours are unremarkable. No acute osseous or soft tissue abnormality. Degenerative changes are present in the imaged spine and shoulders. Telemetry leads overlie the chest. IMPRESSION: Stable scarring in the left mid lung. No acute cardiopulmonary abnormality. Electronically Signed   By: Kreg Shropshire M.D.   On: 02/28/2019 05:31    Procedures Procedures (including critical care time)  Medications Ordered in ED Medications  fentaNYL (SUBLIMAZE) injection 50 mcg (50 mcg Intravenous Given 02/28/19 0432)  ondansetron (ZOFRAN) injection 4 mg (4 mg Intravenous Given 02/28/19 0432)  sodium chloride 0.9 % bolus 500 mL (0 mLs Intravenous Stopped 02/28/19 0607)  iohexol (OMNIPAQUE) 300 MG/ML solution 80 mL (80 mLs Intravenous Contrast Given 02/28/19 0500)    ED Course  I have reviewed the triage vital signs and the nursing notes.  Pertinent labs & imaging results that were available during my care of the patient were reviewed by me and considered in my medical decision making (see chart for details).  Clinical Course as of Feb 28 704  Thu Feb 28, 2019  8657 Significantly elevated lipase  Lipase(!): 2,756 [HM]    Clinical Course User Index [HM] Aliza Moret, Boyd Kerbs   MDM Rules/Calculators/A&P                       Danielle Colon [facility] was evaluated in Emergency Department on 02/28/2019 for the symptoms described in the history of  present illness. She was evaluated in the context of the global COVID-19 pandemic, which necessitated consideration that the patient might be at risk for infection with the SARS-CoV-2 virus that causes COVID-19. Institutional protocols and algorithms that pertain to the evaluation of patients at risk for COVID-19 are in a state of rapid change based on information released by regulatory bodies including the CDC and federal and state organizations. These policies and algorithms were followed during the patient's care in the ED.  Patient presents to the emergency department with epigastric abdominal pain, nausea and vomiting.  Reports this is the same as previous pancreatitis.  CBC and CMP are reassuring.  Lipase continues to pend despite discussion with the lab.  Suspect it is very high.  Creatinine slightly elevated.  Gentle fluid bolus given.    CT scan with edematous pancreas confirming diagnosis of pancreatitis.  Patient with brief drop in oxygen saturations after fentanyl administration.  She was placed on 2 L of oxygen and has maintained her oxygen saturations without difficulty.  No respiratory distress.  Additionally, patient complaining of mild discomfort in her chest.  She is a history of acid reflux and hiatal hernia.  CT scan does show evidence of both reflux and hernia.  Given her pancreatitis and reflux history I suspect this is not an acute coronary syndrome.  Repeat EKG is without ischemia.  Patient was recently evaluated for chest pain and had a follow-up with cardiology December 2020.  Cardiogram in July 2020 showed mild diastolic dysfunction with an EF of 60 to 65%.  Did not recommend stress testing at that time.  6:31 AM Discussed with pt's daughter who confirms pt's hx of pancreatitis.  Questions answered.  Patient is amenable to admission.  The patient was discussed with and seen by Dr. Bebe Shaggy who agrees with the treatment plan.  7:13 AM Discussed with Dr. Katrinka Blazing who will  admit.  Final Clinical Impression(s) / ED Diagnoses Final diagnoses:  Acute pancreatitis, unspecified complication status, unspecified pancreatitis type  Non-intractable vomiting with nausea, unspecified vomiting type    Rx /  DC Orders ED Discharge Orders    None       Mykenna Viele, Boyd Kerbs 02/28/19 1194    Zadie Rhine, MD 02/28/19 [id number]

## 2019-02-28 NOTE — ED Notes (Signed)
Report attempted, 5C unable to take at this time 

## 2019-02-28 NOTE — ED Notes (Signed)
Diet tray ordered 

## 2019-02-28 NOTE — H&P (Addendum)
History and Physical    Danielle Colon [facility] KGM:[id number] DOB: [birth year] DOA: 02/28/2019  Referring MD/NP/PA: Abigail Butts, PA-C PCP: Lawerance Cruel, MD  Patient coming from: Home  Chief Complaint: Abdominal pain  I have personally briefly reviewed patient's old medical records in [place]   HPI: Danielle Colon is a 86 y.o. female with medical history significant of hypertension, hyperlipidemia, hepatitis, pancreatitis, TIA/CVA, obesity, and GERD.  She presents with complaints of abdominal pain starting around 2 AM this morning while laying in bed.  Pain was located on top of her bellybutton with radiation across her abdomen, and was worse than labor pains.  Associated symptoms included nausea and vomiting.  Reports having about 3 episodes of nonbloody and nonbilious emesis.  Pain seemed to improve some after vomiting.  Denies having any fever, chest pain, shortness of breath, cough, leg swelling, dysuria, or recent change in medications.  Symptoms were similar to her previous episode of pancreatitis that she experienced back in October 2019 where she was seen in [place].  Previously told it was secondary to her diet.  She feels that the steak, cheese, and tea that she ate last night is likely the cause of her symptoms.  Daughter reports that patient had just recently had a lipid panel where her cholesterol levels were noted to be elevated.  ED Course:  Patient was noted to have relatively stable vital signs except for O2 saturations dropping down to 87% after given fentanyl, but improved with 2 L nasal cannula ox ygen.  Labs significant for CBC within normal limits, BUN 23, creatinine 1.2, anion gap 18, lipase 2756, and all other LFTs within normal limits.  CT scan of the abdomen and pelvis revealed an acutely edematous pancreatitis with organized collection which there may have been concern for a pancreas divisum, and prominent intrahepatic bile ducts without CBD dilatation or  stones.  She had received 500 mL of normal saline IV fluids, Zofran, and and fentanyl.  TRH called to admit.  Review of Systems  Constitutional: Negative for fever.  HENT: Negative for congestion and nosebleeds.   Eyes: Negative for pain.  Respiratory: Negative for cough and shortness of breath.   Cardiovascular: Negative for chest pain and leg swelling.  Gastrointestinal: Positive for abdominal pain, nausea and vomiting. Negative for diarrhea.  Genitourinary: Negative for dysuria, flank pain and frequency.  Musculoskeletal: Negative for falls.  Skin: Negative for itching.  Neurological: Negative for loss of consciousness and headaches.  Psychiatric/Behavioral: Negative for memory loss and substance abuse.     Past Medical History:  Diagnosis Date   Bell's palsy 2009;    on the right   Brain TIA    pt denies this hx on 07/18/2013; "they thought it was a stroke but dr said later that it was Bell's Palsy"   Cerebrovascular disease    GERD (gastroesophageal reflux disease)    Hepatitis    "don't remember what kind; had it ~ 3 times when I was younger; last time was in the 1990's"   Hyperlipidemia    Hypertension     Past Surgical History:  Procedure Laterality Date   APPENDECTOMY  ~ [place]  ~ [place]     reports that she has never smoked. She has never used smokeless tobacco. She reports that she does not drink alcohol or use drugs.  Allergies  Allergen Reactions   Tessalon Perles [Benzonatate] Other (See Comments)   Neurontin [Gabapentin] Other (  See Comments)   Zantac [Ranitidine Hcl] Other (See Comments)    GI upset    Family History  Problem Relation Age of Onset   Pneumonia Mother        old age   Heart attack Father    Diabetes Father    Diabetes Sister    Diabetes Brother    Cancer Sister    Diabetes Sister    Diabetes Sister     Prior to Admission medications   Medication Sig Start Date End Date  Taking? Authorizing Provider  ascorbic acid (VITAMIN C) 500 MG tablet Take 500 mg by mouth daily.   Yes [provider]  aspirin EC 81 MG tablet Take 81 mg by mouth daily.   Yes [provider]  cholecalciferol (VITAMIN D3) 25 MCG (1000 UNIT) tablet Take 1,000 Units by mouth daily.   Yes [provider]  losartan-hydrochlorothiazide (HYZAAR) 100-25 MG tablet Take 1 tablet by mouth daily. 10/22/18  Yes [provider]  pantoprazole (PROTONIX) 40 MG tablet Take 1 tablet (40 mg total) by mouth 2 (two) times daily before a meal. Patient taking differently: Take 40 mg by mouth daily.  07/19/13  Yes Rai, Ripudeep K, MD  PROLENSA 0.07 % SOLN Place 1 drop into both eyes daily. 02/12/19  Yes [provider]  nitroGLYCERIN (NITROSTAT) 0.4 MG SL tablet Place 1 tablet (0.4 mg total) under the tongue every 5 (five) minutes as needed for chest pain. Patient not taking: Reported on 02/28/2019 07/19/13   Cathren Harsh, MD    Physical Exam:  Constitutional: Elderly female who appears to be in no acute distress at this time Vitals:   02/28/19 0621 02/28/19 0630 02/28/19 0642 02/28/19 0645  BP:  (!) 147/73  (!) 141/77  Pulse:  79  81  Resp:  14  17  Temp: 98 F (36.7 C)     TempSrc: Oral     SpO2:  99%  100%  Weight:   83 kg   Height:   5\' 1"  (1.549 m)    Eyes: Right eye lid lag.  Otherwise pupils equal and reactive to light. ENMT: Mucous membranes are moist. Posterior pharynx clear of any exudate or lesions.Normal dentition.  Neck: normal, supple, no masses, no thyromegaly Respiratory: clear to auscultation bilaterally, no wheezing, no crackles. Normal respiratory effort. No accessory muscle use.  Cardiovascular: Regular rate and rhythm, no murmurs / rubs / gallops.  Trace lower extremity edema. 2+ pedal pulses. No carotid bruits.  Abdomen: Mild epigastric tenderness.  No masses palpated. No hepatosplenomegaly. Bowel sounds positive.  Musculoskeletal: no  clubbing / cyanosis. No joint deformity upper and lower extremities. Good ROM, no contractures. Normal muscle tone.  Skin: no rashes, lesions, ulcers. No induration Neurologic: CN 2-12 grossly intact. Sensation intact, DTR normal. Strength 5/5 in all 4.  Psychiatric: Normal judgment and insight. Alert and oriented x 3. Normal mood.     Labs on Admission: I have personally reviewed following labs and imaging studies  CBC: Recent Labs  Lab 02/28/19 0300  WBC 9.4  NEUTROABS 5.8  HGB 14.1  HCT 45.2  MCV 97.2  PLT 161   Basic Metabolic Panel: Recent Labs  Lab 02/28/19 0300  NA 139  K 4.6  CL 98  CO2 23  GLUCOSE 119*  BUN 23  CREATININE 1.20*  CALCIUM 9.6   GFR: Estimated Creatinine Clearance: 32.9 mL/min (A) (by C-G formula based on SCr of 1.2 mg/dL (H)). Liver Function Tests: Recent Labs  Lab 02/28/19 0300  AST 27  ALT 10  ALKPHOS 64  BILITOT 0.7  PROT 7.0  ALBUMIN 3.9   Recent Labs  Lab 02/28/19 0300  LIPASE 2,756*   No results for input(s): AMMONIA in the last 168 hours. Coagulation Profile: No results for input(s): INR, PROTIME in the last 168 hours. Cardiac Enzymes: No results for input(s): CKTOTAL, CKMB, CKMBINDEX, TROPONINI in the last 168 hours. BNP (last 3 results) No results for input(s): PROBNP in the last 8760 hours. HbA1C: No results for input(s): HGBA1C in the last 72 hours. CBG: No results for input(s): GLUCAP in the last 168 hours. Lipid Profile: No results for input(s): CHOL, HDL, LDLCALC, TRIG, CHOLHDL, LDLDIRECT in the last 72 hours. Thyroid Function Tests: No results for input(s): TSH, T4TOTAL, FREET4, T3FREE, THYROIDAB in the last 72 hours. Anemia Panel: No results for input(s): VITAMINB12, FOLATE, FERRITIN, TIBC, IRON, RETICCTPCT in the last 72 hours. Urine analysis:    Component Value Date/Time   COLORURINE STRAW (A) 12/27/2018 1630   APPEARANCEUR CLEAR 12/27/2018 1630   LABSPEC 1.003 (L) 12/27/2018 1630   PHURINE 7.0  12/27/2018 1630   GLUCOSEU NEGATIVE 12/27/2018 1630   HGBUR NEGATIVE 12/27/2018 1630   BILIRUBINUR NEGATIVE 12/27/2018 1630   KETONESUR NEGATIVE 12/27/2018 1630   PROTEINUR NEGATIVE 12/27/2018 1630   NITRITE NEGATIVE 12/27/2018 1630   LEUKOCYTESUR NEGATIVE 12/27/2018 1630   Sepsis Labs: No results found for this or any previous visit (from the past 240 hour(s)).   Radiological Exams on Admission: CT ABDOMEN PELVIS W CONTRAST  Result Date: 02/28/2019 CLINICAL DATA:  Abdominal pain that began earlier tonight. History of pancreatitis EXAM: CT ABDOMEN AND PELVIS WITH CONTRAST TECHNIQUE: Multidetector CT imaging of the abdomen and pelvis was performed using the standard protocol following bolus administration of intravenous contrast. CONTRAST:  25mL OMNIPAQUE IOHEXOL 300 MG/ML  SOLN COMPARISON:  04/28/2005 FINDINGS: Lower chest: Small sliding hiatal hernia with gastroesophageal poor clearance or reflux. Mild atelectatic type opacity at the bases. Hepatobiliary: Prominent intrahepatic bile duct dimensions. No common bile duct dilatation. No visible calcified stone.No focal liver lesion. Pancreas: Diffuse expansion and peripancreatic edema without necrosis or collection seen. Suspect pancreas divisum. A small cystic dilatation is seen at the level of the distal main pancreatic duct. Spleen: Unremarkable. Adrenals/Urinary Tract: Negative adrenals. No hydronephrosis or stone. Small left renal cystic intensity. Unremarkable bladder. Stomach/Bowel: No primary bowel inflammation. There is moderate distension of the stomach. Mid duodenal diverticulum is seen. Vascular/Lymphatic: No acute vascular abnormality. No mass or adenopathy. Reproductive:No pathologic findings. Other: No ascites or pneumoperitoneum. Musculoskeletal: Facet degeneration in the lower lumbar spine with L5-S1 anterolisthesis. IMPRESSION: 1. Acute edematous pancreatitis without organized collection. There may be pancreas divisum. 2. Prominent  intrahepatic bile ducts without common duct dilatation or calcified choledocholithiasis. 3. Fluid-filled stomach and small hiatal hernia. Electronically Signed   By: Marnee Spring M.D.   On: 02/28/2019 05:31   DG Chest Port 1 View  Result Date: 02/28/2019 CLINICAL DATA:  Epigastric abdominal pain EXAM: PORTABLE CHEST 1 VIEW COMPARISON:  CTA chest 12/27/2018, chest radiograph 12/27/2018 FINDINGS: Stable bandlike area of scarring in the left mid lung. No consolidation, features of edema, pneumothorax, or effusion. The cardiomediastinal contours are unremarkable. No acute osseous or soft tissue abnormality. Degenerative changes are present in the imaged spine and shoulders. Telemetry leads overlie the chest. IMPRESSION: Stable scarring in the left mid lung. No acute cardiopulmonary abnormality. Electronically Signed   By: Kreg Shropshire M.D.   On: 02/28/2019 05:31  EKG: Independently reviewed.  Sinus rhythm at 79 bpm with first-degree heart block.  Assessment/Plan Pancreatitis: Acute.  Patient presents with complaints of abdominal pain.  Lipase elevated at 2756.  CT scan significant for edematous pancreas with organized collection and concern for possible pancreas divisum.  Liver enzymes noted to be within normal limits and no significant signs of CBD dilatation.  Previously was secondary to diet.  Cause not clearly known at this time.  Patient may be at increased risk of recurrent pancreatitis due to pancreatic divisum. -Admit to a MedSurg bed -Check lipid panel -Clear liquid diet and advance as tolerated -Low-dose fentanyl for pain control  Nausea and vomiting: Acute.  Patient noted to have significant nausea and vomiting. -Antiemetics as needed  Hypoxia: Acute.  Patient O2 saturations dropped to 87% on room air after receiving fentanyl.  She was placed on 2 L nasal cannula oxygen. -Continuous pulse oximetry with nasal cannula oxygen to maintain O2 saturations greater than 92%. -Incentive  spirometry  Renal insufficiency: Patient's creatinine 1.2 on admission with BUN 23.  Baseline creatinine previously had been around 1.08 in December 2020. -Continue IV fluids as seen above  Essential hypertension: Home medications include losartan-hydrochlorothiazide 100-25 mg daily.  Her blood pressure medications have been shown to possibly cause pancreatitis as well, but would suspect less likely. -Held home medications do to above problems  GERD: Home medications include Protonix 40 mg daily. -Continue Protonix  Hyperlipidemia: Not on any cholesterol-lowering medicines, but was recently told that her cholesterol levels were elevated. -Follow-up lipid panel  History of CVA with residual deficit.  Patient reports previous history of stroke with residual right-sided facial weakness.  Obesity: BMI 34.57 kg/m  Follow-up Covid-19 screening  DVT prophylaxis: Lovenox Code Status: Full Family Communication: Daughter updated over the phone   Disposition Plan: Likely discharge home once medically stable Consults called: None Admission status: Inpatient   Clydie Braun MD [facility] Pager [phone]   If 7PM-7AM, please contact night-coverage [email or web address] Password [facility]  02/28/2019, 7:12 AM

## 2019-03-01 DIAGNOSIS — E876 Hypokalemia: Secondary | ICD-10-CM | POA: Diagnosis not present

## 2019-03-01 DIAGNOSIS — D649 Anemia, unspecified: Secondary | ICD-10-CM | POA: Diagnosis not present

## 2019-03-01 LAB — COMPREHENSIVE METABOLIC PANEL
ALT: 17 U/L (ref 0–44)
AST: 21 U/L (ref 15–41)
Albumin: 2.7 g/dL — ABNORMAL LOW (ref 3.5–5.0)
Alkaline Phosphatase: 56 U/L (ref 38–126)
Anion gap: 11 (ref 5–15)
BUN: 12 mg/dL (ref 8–23)
CO2: 25 mmol/L (ref 22–32)
Calcium: 8.4 mg/dL — ABNORMAL LOW (ref 8.9–10.3)
Chloride: 105 mmol/L (ref 98–111)
Creatinine, Ser: 1 mg/dL (ref 0.44–1.00)
GFR calc Af Amer: 59 mL/min — ABNORMAL LOW (ref >60)
GFR calc non Af Amer: 51 mL/min — ABNORMAL LOW (ref >60)
Glucose, Bld: 97 mg/dL (ref 70–99)
Potassium: 3.4 mmol/L — ABNORMAL LOW (ref 3.5–5.1)
Sodium: 141 mmol/L (ref 135–145)
Total Bilirubin: 1.2 mg/dL (ref 0.3–1.2)
Total Protein: 5.5 g/dL — ABNORMAL LOW (ref 6.5–8.1)

## 2019-03-01 LAB — CBC
HCT: 35.1 % — ABNORMAL LOW (ref 36.0–46.0)
Hemoglobin: 11.7 g/dL — ABNORMAL LOW (ref 12.0–15.0)
MCH: 30.5 pg (ref 26.0–34.0)
MCHC: 33.3 g/dL (ref 30.0–36.0)
MCV: 91.6 fL (ref 80.0–100.0)
Platelets: 170 10*3/uL (ref 150–400)
RBC: 3.83 MIL/uL — ABNORMAL LOW (ref 3.87–5.11)
RDW: 12.8 % (ref 11.5–15.5)
WBC: 8.5 10*3/uL (ref 4.0–10.5)
nRBC: 0 % (ref 0.0–0.2)

## 2019-03-01 MED ORDER — POTASSIUM CHLORIDE CRYS ER 20 MEQ PO TBCR
20.0000 meq | EXTENDED_RELEASE_TABLET | Freq: Two times a day (BID) | ORAL | Status: AC
  Administered 2019-03-01 (×2): 20 meq via ORAL
  Filled 2019-03-01 (×2): qty 1

## 2019-03-01 NOTE — Progress Notes (Signed)
Pain noted 0/10. Watching tv, no complaints

## 2019-03-01 NOTE — Plan of Care (Signed)
?  Problem: Education: ?Goal: Knowledge of Pancreatitis treatment and prevention will improve ?Outcome: Progressing ?  ?Problem: Health Behavior/Discharge Planning: ?Goal: Ability to formulate a plan to maintain an alcohol-free life will improve ?Outcome: Progressing ?  ?Problem: Nutritional: ?Goal: Ability to achieve adequate nutritional intake will improve ?Outcome: Progressing ?  ?Problem: Clinical Measurements: ?Goal: Complications related to the disease process, condition or treatment will be avoided or minimized ?Outcome: Progressing ?  ?Problem: Education: ?Goal: Knowledge of General Education information will improve ?Description: Including pain rating scale, medication(s)/side effects and non-pharmacologic comfort measures ?Outcome: Progressing ?  ?Problem: Health Behavior/Discharge Planning: ?Goal: Ability to manage health-related needs will improve ?Outcome: Progressing ?  ?Problem: Clinical Measurements: ?Goal: Ability to maintain clinical measurements within normal limits will improve ?Outcome: Progressing ?Goal: Will remain free from infection ?Outcome: Progressing ?Goal: Diagnostic test results will improve ?Outcome: Progressing ?Goal: Respiratory complications will improve ?Outcome: Progressing ?Goal: Cardiovascular complication will be avoided ?Outcome: Progressing ?  ?Problem: Activity: ?Goal: Risk for activity intolerance will decrease ?Outcome: Progressing ?  ?Problem: Nutrition: ?Goal: Adequate nutrition will be maintained ?Outcome: Progressing ?  ?Problem: Coping: ?Goal: Level of anxiety will decrease ?Outcome: Progressing ?  ?Problem: Elimination: ?Goal: Will not experience complications related to bowel motility ?Outcome: Progressing ?Goal: Will not experience complications related to urinary retention ?Outcome: Progressing ?  ?Problem: Pain Managment: ?Goal: General experience of comfort will improve ?Outcome: Progressing ?  ?Problem: Safety: ?Goal: Ability to remain free from injury will  improve ?Outcome: Progressing ?  ?Problem: Skin Integrity: ?Goal: Risk for impaired skin integrity will decrease ?Outcome: Progressing ?  ?

## 2019-03-01 NOTE — Progress Notes (Signed)
Pt alert and oriented x4, no complaints of pain or discomfort.  Bed in low position, call bell within reach.  Bed alarms on and functioning.  Assessment done and charted.  Will continue to monitor and do hourly rounding throughout the shift 

## 2019-03-01 NOTE — Progress Notes (Signed)
PROGRESS NOTE    Danielle Colon [facility]  DEY:[id number] DOB: 1930-07-09 DOA: 02/28/2019 PCP: Daisy Floro, MD     Brief Narrative:  Patient is an 86 year old female with medical history of hypertension, hyperlipidemia, hepatitis, pancreatitis, TIA/CVA history, obesity, and GERD who presents with abdominal pain starting very early on 2/11.  She had some nausea and vomiting.  This is very similar to prior episode of pancreatitis in late 2019.  Lipase 2756 with largely unremarkable labs otherwise.  CT abdomen pelvis showed acutely edematous pancreatitis.  Fluids were started and pain medicine provided.   New events last 24 hours / Subjective: Patient reports feeling much better.  She is not quite hungry yet.  Much less nausea and pain today.  Assessment & Plan:   Principal Problem:   Pancreatitis  Advance diet as tolerated, currently clear liquid  Fentanyl 12.5-25 mcg every 2 hours as needed for severe pain  Tylenol 650 mg every 6 hours as needed for mild pain  IVF normal saline 100 mL an hour  Zofran 4 mg every 6 hours as needed for nausea  Active Problems:   Benign hypertension  Holding home meds for now, if diet progresses and pressures increase can add back    GERD (gastroesophageal reflux disease)  Protonix 40 mg daily    Hyperlipidemia  Noted, not on statin, will defer management to PCP    Hypoxia  Resolved    Hypokalemia  Replaced  Monitor BMP    Anemia  Monitor CBC  Could be dilutional  DVT prophylaxis: Lovenox Code Status: Full Family Communication: Self, daughter was on phone with patient while I spoke with her Coming From: Home Disposition Plan: Home Barriers to Discharge: Clinical improvement   Objective: Vitals:   02/28/19 1829 02/28/19 2318 03/01/19 0445 03/01/19 1222  BP: (!) 146/76 (!) 131/59 117/62 134/71  Pulse: 80 74 75 78  Resp: 16 17 18 18   Temp: 98.4 F (36.9 C) 99.6 F (37.6 C) 98 F (36.7 C) 99.2 F (37.3 C)  TempSrc: Oral Oral Oral  Oral  SpO2: 98% 93% 93% 98%  Weight:      Height:        Intake/Output Summary (Last 24 hours) at 03/01/2019 1303 Last data filed at 03/01/2019 0600 Gross per 24 hour  Intake 1025.95 ml  Output 900 ml  Net 125.95 ml   Filed Weights   02/28/19 0642  Weight: 83 kg    Examination:  General exam: Appears calm and comfortable  Respiratory system: Clear to auscultation. Respiratory effort normal. No respiratory distress. No conversational dyspnea.  Cardiovascular system: S1 & S2 heard, RRR. No murmurs. No pedal edema. Gastrointestinal system: Abdomen is nondistended, soft and + epigastric tenderness to palpation. Normal bowel sounds heard. Central nervous system: Alert and oriented. No focal neurological deficits. Speech clear.  Extremities: Symmetric in appearance  Skin: No rashes, lesions or ulcers on exposed skin  Psychiatry: Judgement and insight appear normal. Mood & affect appropriate.   Data Reviewed: I have personally reviewed following labs and imaging studies  CBC: Recent Labs  Lab 02/28/19 0300 03/01/19 0528  WBC 9.4 8.5  NEUTROABS 5.8  --   HGB 14.1 11.7*  HCT 45.2 35.1*  MCV 97.2 91.6  PLT 161 170   Basic Metabolic Panel: Recent Labs  Lab 02/28/19 0300 03/01/19 0528  NA 139 141  K 4.6 3.4*  CL 98 105  CO2 23 25  GLUCOSE 119* 97  BUN 23 12  CREATININE 1.20* 1.00  CALCIUM 9.6 8.4*   GFR: Estimated Creatinine Clearance: 39.5 mL/min (by C-G formula based on SCr of 1 mg/dL). Liver Function Tests: Recent Labs  Lab 02/28/19 0300 03/01/19 0528  AST 27 21  ALT 10 17  ALKPHOS 64 56  BILITOT 0.7 1.2  PROT 7.0 5.5*  ALBUMIN 3.9 2.7*   Recent Labs  Lab 02/28/19 0300  LIPASE 2,756*   Lipid Profile: Recent Labs    02/28/19 0920  CHOL 237*  HDL 58  LDLCALC 165*  TRIG 68  CHOLHDL 4.1   Recent Results (from the past 240 hour(s))  SARS CORONAVIRUS 2 (TAT 6-24 HRS) Nasopharyngeal Nasopharyngeal Swab     Status: None   Collection Time: 02/28/19   6:37 AM   Specimen: Nasopharyngeal Swab  Result Value Ref Range Status   SARS Coronavirus 2 NEGATIVE NEGATIVE Final    Comment: (NOTE) SARS-CoV-2 target nucleic acids are NOT DETECTED. The SARS-CoV-2 RNA is generally detectable in upper and lower respiratory specimens during the acute phase of infection. Negative results do not preclude SARS-CoV-2 infection, do not rule out co-infections with other pathogens, and should not be used as the sole basis for treatment or other patient management decisions. Negative results must be combined with clinical observations, patient history, and epidemiological information. The expected result is Negative. Fact Sheet for Patients: SugarRoll.be Fact Sheet for Healthcare Providers: https://www.woods-mathews.com/ This test is not yet approved or cleared by the [place] FDA and  has been authorized for detection and/or diagnosis of SARS-CoV-2 by FDA under an Emergency Use Authorization (EUA). This EUA will remain  in effect (meaning this test can be used) for the duration of the COVID-19 declaration under Section 56 4(b)(1) of the Act, 21 U.S.C. section 360bbb-3(b)(1), unless the authorization is terminated or revoked sooner. Performed at [facility], [place] [address], [place], [place] [address]       Radiology Studies: CT ABDOMEN PELVIS W CONTRAST  Result Date: 02/28/2019 CLINICAL DATA:  Abdominal pain that began earlier tonight. History of pancreatitis EXAM: CT ABDOMEN AND PELVIS WITH CONTRAST TECHNIQUE: Multidetector CT imaging of the abdomen and pelvis was performed using the standard protocol following bolus administration of intravenous contrast. CONTRAST:  56mL OMNIPAQUE IOHEXOL 300 MG/ML  SOLN COMPARISON:  04/28/2005 FINDINGS: Lower chest: Small sliding hiatal hernia with gastroesophageal poor clearance or reflux. Mild atelectatic type opacity at the bases. Hepatobiliary: Prominent  intrahepatic bile duct dimensions. No common bile duct dilatation. No visible calcified stone.No focal liver lesion. Pancreas: Diffuse expansion and peripancreatic edema without necrosis or collection seen. Suspect pancreas divisum. A small cystic dilatation is seen at the level of the distal main pancreatic duct. Spleen: Unremarkable. Adrenals/Urinary Tract: Negative adrenals. No hydronephrosis or stone. Small left renal cystic intensity. Unremarkable bladder. Stomach/Bowel: No primary bowel inflammation. There is moderate distension of the stomach. Mid duodenal diverticulum is seen. Vascular/Lymphatic: No acute vascular abnormality. No mass or adenopathy. Reproductive:No pathologic findings. Other: No ascites or pneumoperitoneum. Musculoskeletal: Facet degeneration in the lower lumbar spine with L5-S1 anterolisthesis. IMPRESSION: 1. Acute edematous pancreatitis without organized collection. There may be pancreas divisum. 2. Prominent intrahepatic bile ducts without common duct dilatation or calcified choledocholithiasis. 3. Fluid-filled stomach and small hiatal hernia. Electronically Signed   By: Monte Fantasia M.D.   On: 02/28/2019 05:31   DG Chest Port 1 View  Result Date: 02/28/2019 CLINICAL DATA:  Epigastric abdominal pain EXAM: PORTABLE CHEST 1 VIEW COMPARISON:  CTA chest 12/27/2018, chest radiograph 12/27/2018 FINDINGS: Stable bandlike area of scarring  in the left mid lung. No consolidation, features of edema, pneumothorax, or effusion. The cardiomediastinal contours are unremarkable. No acute osseous or soft tissue abnormality. Degenerative changes are present in the imaged spine and shoulders. Telemetry leads overlie the chest. IMPRESSION: Stable scarring in the left mid lung. No acute cardiopulmonary abnormality. Electronically Signed   By: Kreg Shropshire M.D.   On: 02/28/2019 05:31     Scheduled Meds: . aspirin EC  81 mg Oral Daily  . enoxaparin (LOVENOX) injection  40 mg Subcutaneous Q24H  .  ketorolac  1 drop Both Eyes Daily  . pantoprazole  40 mg Oral Daily  . potassium chloride  20 mEq Oral BID  . sodium chloride flush  3 mL Intravenous Q12H   Continuous Infusions: . sodium chloride 100 mL/hr at 03/01/19 0559     LOS: 1 day    Time spent: 30 minutes   Sharlene Dory, DO [facility] 03/01/2019, 1:03 PM   Available via Epic secure chat 7am-7pm After these hours, please refer to coverage provider listed on [email or web address]

## 2019-03-02 DIAGNOSIS — E876 Hypokalemia: Secondary | ICD-10-CM

## 2019-03-02 LAB — CBC
HCT: 34.4 % — ABNORMAL LOW (ref 36.0–46.0)
Hemoglobin: 11.3 g/dL — ABNORMAL LOW (ref 12.0–15.0)
MCH: 30.1 pg (ref 26.0–34.0)
MCHC: 32.8 g/dL (ref 30.0–36.0)
MCV: 91.5 fL (ref 80.0–100.0)
Platelets: 159 10*3/uL (ref 150–400)
RBC: 3.76 MIL/uL — ABNORMAL LOW (ref 3.87–5.11)
RDW: 12.5 % (ref 11.5–15.5)
WBC: 8.4 10*3/uL (ref 4.0–10.5)
nRBC: 0 % (ref 0.0–0.2)

## 2019-03-02 LAB — BASIC METABOLIC PANEL
Anion gap: 8 (ref 5–15)
BUN: 7 mg/dL — ABNORMAL LOW (ref 8–23)
CO2: 24 mmol/L (ref 22–32)
Calcium: 8.5 mg/dL — ABNORMAL LOW (ref 8.9–10.3)
Chloride: 107 mmol/L (ref 98–111)
Creatinine, Ser: 0.98 mg/dL (ref 0.44–1.00)
GFR calc Af Amer: >60 mL/min (ref >60)
GFR calc non Af Amer: 52 mL/min — ABNORMAL LOW (ref >60)
Glucose, Bld: 88 mg/dL (ref 70–99)
Potassium: 3.4 mmol/L — ABNORMAL LOW (ref 3.5–5.1)
Sodium: 139 mmol/L (ref 135–145)

## 2019-03-02 MED ORDER — LACTATED RINGERS IV BOLUS
1000.0000 mL | Freq: Once | INTRAVENOUS | Status: AC
  Administered 2019-03-02: 1000 mL via INTRAVENOUS

## 2019-03-02 MED ORDER — PANCRELIPASE (LIP-PROT-AMYL) 12000-38000 UNITS PO CPEP
24000.0000 [IU] | ORAL_CAPSULE | Freq: Three times a day (TID) | ORAL | Status: DC
  Administered 2019-03-02 – 2019-03-05 (×8): 24000 [IU] via ORAL
  Filled 2019-03-02 (×11): qty 2

## 2019-03-02 MED ORDER — LACTATED RINGERS IV SOLN: INTRAVENOUS | Status: DC

## 2019-03-02 NOTE — Progress Notes (Signed)
PROGRESS NOTE    Danielle Colon [facility]  LPF:[id number] DOB: 1930-04-05 DOA: 02/28/2019 PCP: Daisy Floro, MD    Brief Narrative:  Patient is an 86 year old female with medical history of hypertension, hyperlipidemia, hepatitis, pancreatitis, TIA/CVA history, obesity, and GERD who presents with abdominal pain starting very early on 2/11.  She had some nausea and vomiting.  This is very similar to prior episode of pancreatitis in late 2019.  Lipase 2756 with largely unremarkable labs otherwise.  CT abdomen pelvis showed acutely edematous pancreatitis.  Fluids were started and pain medicine provided. Patient was seen today and still has mild epigastric tenderness on palpation.  However she is tolerating p.o. intake. Initiated pancreatic enzyme replacement and monitor for improvement. Willl likely get abdominal x-ray tomorrow to reassess.  Assessment & Plan:   Principal Problem:   Pancreatitis Active Problems:   Benign hypertension   GERD (gastroesophageal reflux disease)   Hyperlipidemia   Obesity (BMI 30.0-34.9)   Hypoxia   Hypokalemia   Anemia Clinical problems list 1.  Acute pancreatitis 2.  Essential hypertension 3.  Gastroesophageal reflux disease 4.  Hypokalemia 5.  Chronic normocytic anemia 6.  Right calf tenderness  1.  Acute pancreatitis.  CT scan of the abdomen and pelvis showed acute edematous pancreatitis without organized collection noted significant elevation in lipase of 2756.  Patient currently on IV fluid, pain medication and liquid diet.  Tolerating liquid diet.  Will advance diet as tolerated.  Patient has no teeth so will initiate soft diet for now. Initiate pancreatic enzyme replacement Obtain abdominal x-ray tomorrow to assess.  2.  Essential hypertension.  Currently normotensive Continue to monitor blood pressure  3.  Gastroesophageal reflux disease Continue with Protonix  4.  Hypokalemia.  Will check magnesium.  Likely PPI induced Monitor potassium  and replete  5.  Chronic normocytic anemia.  Hemoglobin stable Monitor CBC  6.  Right calf tenderness.  Patient complains of sudden right calf tenderness.  Differential warmth with left lower extremity. Obtain right lower extremity Doppler scan.    DVT prophylaxis: Lovenox subcute Code Status: Full code  Family Communication: None at bedside  Disposition Plan: Patient is from home will be discharged back home. Barrier to discharge include current epigastric pain.  Patient was started on advance diet and needs 24 to 48 hours assessment for tolerating p.o. intake.   Consultants:   None  Procedures: None  Antimicrobials: None   Subjective: Patient was seen and examined at bedside.  Not in acute distress.  Alert and oriented x3.  Denies any nausea or vomiting.  Still has mild epigastric tenderness with palpation.  Tolerating liquid diet.  Advance diet to soft. Will obtain abdominal x-ray tomorrow 03/03/2019  Objective: Vitals:   03/01/19 1741 03/01/19 2325 03/02/19 0500 03/02/19 1018  BP: 138/67 139/72 (!) 141/70 (!) 145/64  Pulse: 88 86 78 72  Resp: 16 18 18 18   Temp: 99.9 F (37.7 C) 98.9 F (37.2 C) 97.8 F (36.6 C) 98.1 F (36.7 C)  TempSrc: Oral Oral Oral Oral  SpO2: 97% 95% 94% 98%  Weight:      Height:        Intake/Output Summary (Last 24 hours) at 03/02/2019 1717 Last data filed at 03/02/2019 0500 Gross per 24 hour  Intake --  Output 1100 ml  Net -1100 ml   Filed Weights   02/28/19 0642  Weight: 83 kg    Examination:  General exam: Appears calm and comfortable  Respiratory system: Clear to auscultation. Respiratory  effort normal. Cardiovascular system: S1 & S2 heard, RRR. No JVD, murmurs, rubs, gallops or clicks. No pedal edema. Gastrointestinal system: Epigastric tenderness with palpation.  Abdomen is nondistended,Normal bowel sounds heard. Central nervous system: Alert and oriented. No focal neurological deficits. Extremities: Symmetric 5 x 5  power. Skin: No rashes, lesions or ulcers Psychiatry: Judgement and insight appear normal. Mood & affect appropriate.     Data Reviewed: I have personally reviewed following labs and imaging studies  CBC: Recent Labs  Lab 02/28/19 0300 03/01/19 0528 03/02/19 0555  WBC 9.4 8.5 8.4  NEUTROABS 5.8  --   --   HGB 14.1 11.7* 11.3*  HCT 45.2 35.1* 34.4*  MCV 97.2 91.6 91.5  PLT 161 170 992   Basic Metabolic Panel: Recent Labs  Lab 02/28/19 0300 03/01/19 0528 03/02/19 0555  NA 139 141 139  K 4.6 3.4* 3.4*  CL 98 105 107  CO2 23 25 24   GLUCOSE 119* 97 88  BUN 23 12 7*  CREATININE 1.20* 1.00 0.98  CALCIUM 9.6 8.4* 8.5*   GFR: Estimated Creatinine Clearance: 40.3 mL/min (by C-G formula based on SCr of 0.98 mg/dL). Liver Function Tests: Recent Labs  Lab 02/28/19 0300 03/01/19 0528  AST 27 21  ALT 10 17  ALKPHOS 64 56  BILITOT 0.7 1.2  PROT 7.0 5.5*  ALBUMIN 3.9 2.7*   Recent Labs  Lab 02/28/19 0300  LIPASE 2,756*   No results for input(s): AMMONIA in the last 168 hours. Coagulation Profile: No results for input(s): INR, PROTIME in the last 168 hours. Cardiac Enzymes: No results for input(s): CKTOTAL, CKMB, CKMBINDEX, TROPONINI in the last 168 hours. BNP (last 3 results) No results for input(s): PROBNP in the last 8760 hours. HbA1C: No results for input(s): HGBA1C in the last 72 hours. CBG: No results for input(s): GLUCAP in the last 168 hours. Lipid Profile: Recent Labs    02/28/19 0920  CHOL 237*  HDL 58  LDLCALC 165*  TRIG 68  CHOLHDL 4.1   Thyroid Function Tests: No results for input(s): TSH, T4TOTAL, FREET4, T3FREE, THYROIDAB in the last 72 hours. Anemia Panel: No results for input(s): VITAMINB12, FOLATE, FERRITIN, TIBC, IRON, RETICCTPCT in the last 72 hours. Sepsis Labs: No results for input(s): PROCALCITON, LATICACIDVEN in the last 168 hours.  Recent Results (from the past 240 hour(s))  SARS CORONAVIRUS 2 (TAT 6-24 HRS) Nasopharyngeal  Nasopharyngeal Swab     Status: None   Collection Time: 02/28/19  6:37 AM   Specimen: Nasopharyngeal Swab  Result Value Ref Range Status   SARS Coronavirus 2 NEGATIVE NEGATIVE Final    Comment: (NOTE) SARS-CoV-2 target nucleic acids are NOT DETECTED. The SARS-CoV-2 RNA is generally detectable in upper and lower respiratory specimens during the acute phase of infection. Negative results do not preclude SARS-CoV-2 infection, do not rule out co-infections with other pathogens, and should not be used as the sole basis for treatment or other patient management decisions. Negative results must be combined with clinical observations, patient history, and epidemiological information. The expected result is Negative. Fact Sheet for Patients: SugarRoll.be Fact Sheet for Healthcare Providers: https://www.woods-mathews.com/ This test is not yet approved or cleared by the [place] FDA and  has been authorized for detection and/or diagnosis of SARS-CoV-2 by FDA under an Emergency Use Authorization (EUA). This EUA will remain  in effect (meaning this test can be used) for the duration of the COVID-19 declaration under Section 56 4(b)(1) of the Act, 21 U.S.C. section 360bbb-3(b)(1), unless the  authorization is terminated or revoked sooner. Performed at [facility], [address], [place], [place] [address]          Radiology Studies: No results found.      Scheduled Meds: . aspirin EC  81 mg Oral Daily  . enoxaparin (LOVENOX) injection  40 mg Subcutaneous Q24H  . ketorolac  1 drop Both Eyes Daily  . lipase/protease/amylase  24,000 Units Oral TID AC  . pantoprazole  40 mg Oral Daily  . sodium chloride flush  3 mL Intravenous Q12H   Continuous Infusions: . lactated ringers 125 mL/hr at 03/02/19 1000     LOS: 2 days    Time spent: 35 minutes    Aquilla Hacker, MD [facility] Pager [phone]   If 7PM-7AM,  please contact night-coverage [email or web address] Password [facility] 03/02/2019, 5:17 PM

## 2019-03-03 ENCOUNTER — Inpatient Hospital Stay (HOSPITAL_COMMUNITY): Payer: Medicare Other

## 2019-03-03 DIAGNOSIS — M79609 Pain in unspecified limb: Secondary | ICD-10-CM

## 2019-03-03 LAB — COMPREHENSIVE METABOLIC PANEL
ALT: 15 U/L (ref 0–44)
AST: 15 U/L (ref 15–41)
Albumin: 2.6 g/dL — ABNORMAL LOW (ref 3.5–5.0)
Alkaline Phosphatase: 64 U/L (ref 38–126)
Anion gap: 9 (ref 5–15)
BUN: 7 mg/dL — ABNORMAL LOW (ref 8–23)
CO2: 24 mmol/L (ref 22–32)
Calcium: 8.9 mg/dL (ref 8.9–10.3)
Chloride: 106 mmol/L (ref 98–111)
Creatinine, Ser: 0.96 mg/dL (ref 0.44–1.00)
GFR calc Af Amer: >60 mL/min (ref >60)
GFR calc non Af Amer: 54 mL/min — ABNORMAL LOW (ref >60)
Glucose, Bld: 94 mg/dL (ref 70–99)
Potassium: 3.8 mmol/L (ref 3.5–5.1)
Sodium: 139 mmol/L (ref 135–145)
Total Bilirubin: 0.8 mg/dL (ref 0.3–1.2)
Total Protein: 5.7 g/dL — ABNORMAL LOW (ref 6.5–8.1)

## 2019-03-03 LAB — CBC
HCT: 33.8 % — ABNORMAL LOW (ref 36.0–46.0)
Hemoglobin: 11.5 g/dL — ABNORMAL LOW (ref 12.0–15.0)
MCH: 30.4 pg (ref 26.0–34.0)
MCHC: 34 g/dL (ref 30.0–36.0)
MCV: 89.4 fL (ref 80.0–100.0)
Platelets: 163 10*3/uL (ref 150–400)
RBC: 3.78 MIL/uL — ABNORMAL LOW (ref 3.87–5.11)
RDW: 12.2 % (ref 11.5–15.5)
WBC: 8.7 10*3/uL (ref 4.0–10.5)
nRBC: 0 % (ref 0.0–0.2)

## 2019-03-03 LAB — MAGNESIUM: Magnesium: 1.5 mg/dL — ABNORMAL LOW (ref 1.7–2.4)

## 2019-03-03 LAB — PHOSPHORUS: Phosphorus: 2.9 mg/dL (ref 2.5–4.6)

## 2019-03-03 LAB — SEDIMENTATION RATE: Sed Rate: 57 mm/hr — ABNORMAL HIGH (ref 0–22)

## 2019-03-03 LAB — C-REACTIVE PROTEIN: CRP: 6 mg/dL — ABNORMAL HIGH (ref <1.0)

## 2019-03-03 MED ORDER — MAGNESIUM SULFATE 2 GM/50ML IV SOLN
2.0000 g | Freq: Once | INTRAVENOUS | Status: AC
  Administered 2019-03-03: 2 g via INTRAVENOUS
  Filled 2019-03-03: qty 50

## 2019-03-03 NOTE — Progress Notes (Signed)
VASCULAR LAB PRELIMINARY  PRELIMINARY  PRELIMINARY  PRELIMINARY  Right lower extremity venous duplex completed.    Preliminary report:  See CV proc for preliminary results.   Belal Scallon, RVT 03/03/2019, 3:37 PM

## 2019-03-03 NOTE — Progress Notes (Signed)
PROGRESS NOTE    Danielle Colon [facility]  KKX:[id number] DOB: Feb 13, 1930 DOA: 02/28/2019 PCP: Daisy Floro, MD    Brief Narrative:  Patient is an 86 year old female with medical history of hypertension, hyperlipidemia, hepatitis, pancreatitis, TIA/CVA history, obesity, and GERD who presents with abdominal pain starting very early on 2/11.  She had some nausea and vomiting.  This is very similar to prior episode of pancreatitis in late 2019.  Lipase 2756 with largely unremarkable labs otherwise.  CT abdomen pelvis showed acutely edematous pancreatitis.  Fluids were started and pain medicine provided. Patient was seen today and still has mild epigastric tenderness on palpation.  However she is tolerating p.o. intake. Initiated pancreatic enzyme replacement and monitor for improvement. Willl likely get abdominal x-ray tomorrow to reassess. Patient had right calf pain for which Doppler ultrasound of right lower extremity was ordered.  Follow-up report  03/03/2019 Plain abdominal x-ray report pending Patient complained of right foot pain.  Pain localized at right metatarsal.  Mild warmth and significant tenderness to palpation.  Concerns for possible gouty arthropathy.  Will obtain x-ray of right foot.   Assessment & Plan:   Principal Problem:   Pancreatitis Active Problems:   Benign hypertension   GERD (gastroesophageal reflux disease)   Hyperlipidemia   Obesity (BMI 30.0-34.9)   Hypoxia   Hypokalemia   Anemia Clinical problems list 1.  Acute pancreatitis 2.  Essential hypertension 3.  Gastroesophageal reflux disease 4.  Hypokalemia 5.  Chronic normocytic anemia 6.  Right calf tenderness #7.  Right foot pain  1.  Acute pancreatitis.  CT scan of the abdomen and pelvis showed acute edematous pancreatitis without organized collection noted significant elevation in lipase of 2756.  Patient currently on IV fluid, pain medication and liquid diet.  Tolerating liquid diet.  Will advance  diet as tolerated.  Patient has no teeth so will initiate soft diet for now. Initiate pancreatic enzyme replacement Obtain abdominal x-ray tomorrow to assess.  2.  Essential hypertension.  Currently normotensive Continue to monitor blood pressure  3.  Gastroesophageal reflux disease Continue with Protonix  4.  Hypokalemia.  Will check magnesium.  Likely PPI induced Monitor potassium and replete  5.  Chronic normocytic anemia.  Hemoglobin stable Monitor CBC  6.  Right calf tenderness.  Patient complains of sudden right calf tenderness.  Differential warmth with left lower extremity. Obtain right lower extremity Doppler scan.  #7.  Right foot pain.  Pain located at right metatarsal.  Tenderness to palpation and differential warmth.  Concern for possible gouty arthropathy. Obtain x-ray of right foot Initiate pain management  DVT prophylaxis: Lovenox subcute Code Status: Full code  Family Communication: None at bedside  Disposition Plan: Patient is from home will be discharged back home. Barrier to discharge include current epigastric pain.  Patient was started on advance diet and needs 24 to 48 hours assessment for tolerating p.o. intake.   Consultants:   None  Procedures: None  Antimicrobials: None   Subjective: Patient was seen and examined at bedside.  Not in acute distress.  Alert and oriented x3.  Denies any nausea or vomiting.  Patient still has epigastric tenderness to palpation and complaint of right foot pain located at right first metatarsal.  She is tolerating soft diet without any nausea abdominal distention. Objective: Vitals:   03/02/19 1830 03/03/19 0015 03/03/19 0650 03/03/19 1153  BP: (!) 158/79 (!) 171/85 (!) 165/75 (!) 145/79  Pulse: 86 80 77 75  Resp: 18 17 18  18  Temp: 98.9 F (37.2 C) 98 F (36.7 C) 98.8 F (37.1 C) 98.3 F (36.8 C)  TempSrc: Oral Oral Oral Oral  SpO2: 98% 95% 94% 97%  Weight:      Height:        Intake/Output Summary (Last  24 hours) at 03/03/2019 1311 Last data filed at 03/03/2019 1210 Gross per 24 hour  Intake 1615 ml  Output 1900 ml  Net -285 ml   Filed Weights   02/28/19 0642  Weight: 83 kg    Examination:  General exam: Appears calm and comfortable  Respiratory system: Clear to auscultation. Respiratory effort normal. Cardiovascular system: S1 & S2 heard, RRR. No JVD, murmurs, rubs, gallops or clicks. No pedal edema. Gastrointestinal system: Epigastric tenderness with palpation.  Abdomen is nondistended,Normal bowel sounds heard. Central nervous system: Alert and oriented. No focal neurological deficits. Extremities: Right first metatarsal tenderness with differential warmth.  Symmetric 5 x 5 power. Skin: No rashes, lesions or ulcers Psychiatry: Judgement and insight appear normal. Mood & affect appropriate.     Data Reviewed: I have personally reviewed following labs and imaging studies  CBC: Recent Labs  Lab 02/28/19 0300 03/01/19 0528 03/02/19 0555 03/03/19 0238  WBC 9.4 8.5 8.4 8.7  NEUTROABS 5.8  --   --   --   HGB 14.1 11.7* 11.3* 11.5*  HCT 45.2 35.1* 34.4* 33.8*  MCV 97.2 91.6 91.5 89.4  PLT 161 170 159 163   Basic Metabolic Panel: Recent Labs  Lab 02/28/19 0300 03/01/19 0528 03/02/19 0555 03/03/19 0238  NA 139 141 139 139  K 4.6 3.4* 3.4* 3.8  CL 98 105 107 106  CO2 23 25 24 24   GLUCOSE 119* 97 88 94  BUN 23 12 7* 7*  CREATININE 1.20* 1.00 0.98 0.96  CALCIUM 9.6 8.4* 8.5* 8.9  MG  --   --   --  1.5*  PHOS  --   --   --  2.9   GFR: Estimated Creatinine Clearance: 41.1 mL/min (by C-G formula based on SCr of 0.96 mg/dL). Liver Function Tests: Recent Labs  Lab 02/28/19 0300 03/01/19 0528 03/03/19 0238  AST 27 21 15   ALT 10 17 15   ALKPHOS 64 56 64  BILITOT 0.7 1.2 0.8  PROT 7.0 5.5* 5.7*  ALBUMIN 3.9 2.7* 2.6*   Recent Labs  Lab 02/28/19 0300  LIPASE 2,756*   No results for input(s): AMMONIA in the last 168 hours. Coagulation Profile: No results for  input(s): INR, PROTIME in the last 168 hours. Cardiac Enzymes: No results for input(s): CKTOTAL, CKMB, CKMBINDEX, TROPONINI in the last 168 hours. BNP (last 3 results) No results for input(s): PROBNP in the last 8760 hours. HbA1C: No results for input(s): HGBA1C in the last 72 hours. CBG: No results for input(s): GLUCAP in the last 168 hours. Lipid Profile: No results for input(s): CHOL, HDL, LDLCALC, TRIG, CHOLHDL, LDLDIRECT in the last 72 hours. Thyroid Function Tests: No results for input(s): TSH, T4TOTAL, FREET4, T3FREE, THYROIDAB in the last 72 hours. Anemia Panel: No results for input(s): VITAMINB12, FOLATE, FERRITIN, TIBC, IRON, RETICCTPCT in the last 72 hours. Sepsis Labs: No results for input(s): PROCALCITON, LATICACIDVEN in the last 168 hours.  Recent Results (from the past 240 hour(s))  SARS CORONAVIRUS 2 (TAT 6-24 HRS) Nasopharyngeal Nasopharyngeal Swab     Status: None   Collection Time: 02/28/19  6:37 AM   Specimen: Nasopharyngeal Swab  Result Value Ref Range Status   SARS Coronavirus 2 NEGATIVE NEGATIVE Final  Comment: (NOTE) SARS-CoV-2 target nucleic acids are NOT DETECTED. The SARS-CoV-2 RNA is generally detectable in upper and lower respiratory specimens during the acute phase of infection. Negative results do not preclude SARS-CoV-2 infection, do not rule out co-infections with other pathogens, and should not be used as the sole basis for treatment or other patient management decisions. Negative results must be combined with clinical observations, patient history, and epidemiological information. The expected result is Negative. Fact Sheet for Patients: SugarRoll.be Fact Sheet for Healthcare Providers: https://www.woods-mathews.com/ This test is not yet approved or cleared by the [place] FDA and  has been authorized for detection and/or diagnosis of SARS-CoV-2 by FDA under an Emergency Use Authorization (EUA).  This EUA will remain  in effect (meaning this test can be used) for the duration of the COVID-19 declaration under Section 56 4(b)(1) of the Act, 21 U.S.C. section 360bbb-3(b)(1), unless the authorization is terminated or revoked sooner. Performed at [facility], [place] [address] [place], [place] [address]          Radiology Studies: No results found.      Scheduled Meds: . aspirin EC  81 mg Oral Daily  . enoxaparin (LOVENOX) injection  40 mg Subcutaneous Q24H  . ketorolac  1 drop Both Eyes Daily  . lipase/protease/amylase  24,000 Units Oral TID AC  . pantoprazole  40 mg Oral Daily  . sodium chloride flush  3 mL Intravenous Q12H   Continuous Infusions: . lactated ringers 125 mL/hr at 03/03/19 1108     LOS: 3 days    Time spent: 35 minutes    Elie Confer, MD [facility] Pager [phone]   If 7PM-7AM, please contact night-coverage [email or web address] Password [email or web address] 03/03/2019, 1:11 PM

## 2019-03-04 ENCOUNTER — Encounter (HOSPITAL_COMMUNITY): Payer: Self-pay | Admitting: Internal Medicine

## 2019-03-04 LAB — CBC
HCT: 35.5 % — ABNORMAL LOW (ref 36.0–46.0)
Hemoglobin: 12 g/dL (ref 12.0–15.0)
MCH: 30.8 pg (ref 26.0–34.0)
MCHC: 33.8 g/dL (ref 30.0–36.0)
MCV: 91 fL (ref 80.0–100.0)
Platelets: 181 10*3/uL (ref 150–400)
RBC: 3.9 MIL/uL (ref 3.87–5.11)
RDW: 12.5 % (ref 11.5–15.5)
WBC: 7.7 10*3/uL (ref 4.0–10.5)
nRBC: 0 % (ref 0.0–0.2)

## 2019-03-04 MED ORDER — MAGNESIUM SULFATE 2 GM/50ML IV SOLN
2.0000 g | Freq: Once | INTRAVENOUS | Status: AC
  Administered 2019-03-04: 13:00:00 2 g via INTRAVENOUS
  Filled 2019-03-04: qty 50

## 2019-03-04 NOTE — Evaluation (Signed)
Physical Therapy Evaluation Patient Details Name: Danielle Colon MRN: [id number] DOB: [birth year] Today's Date: 03/04/2019   History of Present Illness  Pt is an 86 y/o female admitted secondary to abdominal pain. Found to have pancreatitis. Pt also with R foot pain and complained of L big toe pain during session. RLE negative for DVT. PMH includes HTN, hepatitis, and CVA.   Clinical Impression  Pt admitted secondary to problem above with deficits below. Pt complaining of bilateral big toe pain which limited ambulation tolerance. Pt requiring min to min guard A for mobility with RW. Feel pt would benefit from HHPT at d/c given current deficits. Will continue to follow acutely to maximize functional mobility independence and safety.      Follow Up Recommendations Home health PT;Supervision for mobility/OOB    Equipment Recommendations  None recommended by PT    Recommendations for Other Services       Precautions / Restrictions Precautions Precautions: Fall Restrictions Weight Bearing Restrictions: No      Mobility  Bed Mobility Overal bed mobility: Needs Assistance Bed Mobility: Supine to Sit;Sit to Supine     Supine to sit: Min assist Sit to supine: Supervision   General bed mobility comments: Min A for trunk elevation. Increased time required to come to sitting.   Transfers Overall transfer level: Needs assistance Equipment used: Rolling walker (2 wheeled) Transfers: Sit to/from Stand Sit to Stand: Min guard         General transfer comment: Min guard for safety. Cues for safe hand placement.   Ambulation/Gait Ambulation/Gait assistance: Min guard Gait Distance (Feet): 70 Feet Assistive device: Rolling walker (2 wheeled) Gait Pattern/deviations: Step-through pattern;Decreased stride length Gait velocity: Decreased   General Gait Details: Pt reporting bilateral big toe pain which limited ambulation tolerance. Educated about using RW at home to help with toe  pain.   Stairs            Wheelchair Mobility    Modified Rankin (Stroke Patients Only)       Balance Overall balance assessment: Needs assistance Sitting-balance support: No upper extremity supported;Feet supported Sitting balance-Leahy Scale: Good     Standing balance support: Bilateral upper extremity supported;During functional activity Standing balance-Leahy Scale: Poor Standing balance comment: Reliant on BUE support                              Pertinent Vitals/Pain Pain Assessment: Faces Faces Pain Scale: Hurts even more Pain Location: bilateral big toes Pain Descriptors / Indicators: Grimacing;Guarding Pain Intervention(s): Monitored during session;Limited activity within patient's tolerance;Repositioned    Home Living Family/patient expects to be discharged to:: Private residence Living Arrangements: Children Available Help at Discharge: Family;Available PRN/intermittently Type of Home: House Home Access: Level entry     Home Layout: One level Home Equipment: Walker - 2 wheels;Bedside commode Additional Comments: Pt reports her daughter has camera in the house, so she can watch patient during the day.     Prior Function Level of Independence: Independent         Comments: Pt reports independence with mobility. Reports she has been driving.      Hand Dominance        Extremity/Trunk Assessment   Upper Extremity Assessment Upper Extremity Assessment: Overall WFL for tasks assessed    Lower Extremity Assessment Lower Extremity Assessment: RLE deficits/detail;LLE deficits/detail;Generalized weakness RLE Deficits / Details: Reports R big toe pain  LLE Deficits / Details: Reports L big  toe pain     Cervical / Trunk Assessment Cervical / Trunk Assessment: Normal  Communication   Communication: No difficulties  Cognition Arousal/Alertness: Awake/alert Behavior During Therapy: WFL for tasks assessed/performed Overall Cognitive  Status: Within Functional Limits for tasks assessed                                        General Comments General comments (skin integrity, edema, etc.): Educated about using 3 in 1 for shower seat at d/c given pt with increased bilateral foot pain     Exercises     Assessment/Plan    PT Assessment Patient needs continued PT services  PT Problem List Decreased strength;Decreased activity tolerance;Decreased balance;Decreased mobility;Decreased knowledge of use of DME;Pain       PT Treatment Interventions Gait training;DME instruction;Functional mobility training;Therapeutic activities;Therapeutic exercise;Balance training;Patient/family education    PT Goals (Current goals can be found in the Care Plan section)  Acute Rehab PT Goals Patient Stated Goal: to decrease pain  PT Goal Formulation: With patient Time For Goal Achievement: 03/18/19 Potential to Achieve Goals: Good    Frequency Min 3X/week   Barriers to discharge        Co-evaluation               AM-PAC PT "6 Clicks" Mobility  Outcome Measure Help needed turning from your back to your side while in a flat bed without using bedrails?: A Little Help needed moving from lying on your back to sitting on the side of a flat bed without using bedrails?: A Little Help needed moving to and from a bed to a chair (including a wheelchair)?: A Little Help needed standing up from a chair using your arms (e.g., wheelchair or bedside chair)?: A Little Help needed to walk in hospital room?: A Little Help needed climbing 3-5 steps with a railing? : A Lot 6 Click Score: 17    End of Session Equipment Utilized During Treatment: Gait belt Activity Tolerance: Patient limited by pain Patient left: in bed;with call bell/phone within reach;with bed alarm set Nurse Communication: Mobility status PT Visit Diagnosis: Other abnormalities of gait and mobility (R26.89);Difficulty in walking, not elsewhere classified  (R26.2);Pain Pain - Right/Left: (bilateral) Pain - part of body: Ankle and joints of foot    Time: 1231-1250 PT Time Calculation (min) (ACUTE ONLY): 19 min   Charges:   PT Evaluation $PT Eval Low Complexity: 1 Low          Cindee Salt, DPT  Acute Rehabilitation Services  Pager: [phone] Office: [phone]   Lehman Prom 03/04/2019, 1:01 PM

## 2019-03-04 NOTE — Progress Notes (Signed)
Pt observed throughout shift for safety. Tylenol given x1, effective. Pt slept well. No s/s of distress. Call bell within reach.

## 2019-03-04 NOTE — Plan of Care (Signed)
  Problem: Education: Goal: Knowledge of Pancreatitis treatment and prevention will improve Outcome: Progressing   Problem: Education: Goal: Knowledge of General Education information will improve Description: Including pain rating scale, medication(s)/side effects and non-pharmacologic comfort measures Outcome: Progressing   Problem: Clinical Measurements: Goal: Respiratory complications will improve Outcome: Progressing Note: On room air   Problem: Activity: Goal: Risk for activity intolerance will decrease Outcome: Progressing Note: Up with PT walked hallway with 1 assist ans walker   Problem: Nutrition: Goal: Adequate nutrition will be maintained Outcome: Progressing   Problem: Pain Managment: Goal: General experience of comfort will improve Outcome: Progressing Note: Treated bilateral great toe pain once with tylenol which gave releif   Problem: Safety: Goal: Ability to remain free from injury will improve Outcome: Progressing

## 2019-03-04 NOTE — Progress Notes (Signed)
PROGRESS NOTE    Danielle Colon  SWF:[id number] DOB: 07-Jul-1930 DOA: 02/28/2019 PCP: Lawerance Cruel, MD   Brief Narrative:  Patient is an 86 year old female with medical history of hypertension, hyperlipidemia, hepatitis, pancreatitis, TIA/CVA history, obesity, and GERD who presents with abdominal pain starting very early on 2/11. She had some nausea and vomiting. This is very similar to prior episode of pancreatitis in late 2019. Lipase 2756 with largely unremarkable labs otherwise. CT abdomen pelvis showed acutely edematous pancreatitis.Fluids were started and pain medicine provided. Patient was seen today and still has mild epigastric tenderness on palpation.  However she is tolerating p.o. intake. Initiated pancreatic enzyme replacement and monitor for improvement.  03/04/2019 R foot pain improving with tylenol, abdominal pain resolving and eating some with success.   Assessment & Plan:   Principal Problem:   Pancreatitis Active Problems:   Benign hypertension   GERD (gastroesophageal reflux disease)   Hyperlipidemia   Obesity (BMI 30.0-34.9)   Hypoxia   Hypokalemia   Anemia  Acute pancreatitis: -CT with acute changes no fluid collection on admit, x-ray yesterday without changes -tylenol for pain currently -advanced diet yesterday afternoon and she would like to try that all day today -if no recurrent pain tomorrow safe for D/C -on creon  Essential hypertension: -normotensive, continue to monitor  Gastroesophageal reflux disease: -Continue with Protonix  Hypokalemia: -magnesium low yesterday not replaced with order magnesium sulfate 2 g today -morning labs not done today yet  Chronic normocytic anemia: -stable, monitor for bleeding -Monitor CBC  Right foot pain/calf tenderness: -overall resolving with tylenol -x-ray not revealing, [place] negative for DVT  DVT prophylaxis: Lovenox Code Status: full Family Communication: patient only Disposition  Plan:  . Patient came from:home            . Anticipated d/c place:home . Barriers to d/c OR conditions which need to be met to effect a safe d/c:needs today with good dietary intake, stopping IVF today. PT evaluation to assess if HH is needed, plan for D/C 03/05/19  Consultants:   none  Antimicrobials:   none   Subjective: Feeling okay this morning, did increase diet yesterday later in day. Denies nausea or vomiting afterwards. Denies abdominal pain. Took tylenol once yesterday for headache. Denies chest pains or SOB. Appetite is poor. She is starting to feel a little better.   Objective: Vitals:   03/03/19 1802 03/03/19 2350 03/04/19 0547 03/04/19 0838  BP: 138/86 133/79 (!) 174/82 (!) 175/80  Pulse: 80 84 85 70  Resp: '20 20  20  '$ Temp: 99.3 F (37.4 C) 98.9 F (37.2 C) 98.4 F (36.9 C) (!) 97.5 F (36.4 C)  TempSrc: Oral Oral Oral Oral  SpO2: 97% 98% 95% 95%  Weight:      Height:        Intake/Output Summary (Last 24 hours) at 03/04/2019 1204 Last data filed at 03/04/2019 1010 Gross per 24 hour  Intake 3103.56 ml  Output 2675 ml  Net 428.56 ml   Filed Weights   02/28/19 0642  Weight: 83 kg   Examination:  General exam: Appears calm and comfortable  Respiratory system: Clear to auscultation. Respiratory effort normal. Cardiovascular system: S1 & S2 heard, RRR. No JVD, murmurs, rubs, gallops or clicks. No pedal edema. Gastrointestinal system: Abdomen is nondistended, soft and nontender. No organomegaly or masses felt. Normal bowel sounds heard. Central nervous system: Alert and oriented. No focal neurological deficits. Extremities: Symmetric 5 x 5 power. Skin: No rashes, lesions or ulcers Psychiatry:  Judgement and insight appear normal. Mood & affect appropriate.   Data Reviewed: I have personally reviewed following labs and imaging studies  CBC: Recent Labs  Lab 02/28/19 0300 03/01/19 0528 03/02/19 0555 03/03/19 0238  WBC 9.4 8.5 8.4 8.7  NEUTROABS 5.8   --   --   --   HGB 14.1 11.7* 11.3* 11.5*  HCT 45.2 35.1* 34.4* 33.8*  MCV 97.2 91.6 91.5 89.4  PLT 161 170 159 076   Basic Metabolic Panel: Recent Labs  Lab 02/28/19 0300 03/01/19 0528 03/02/19 0555 03/03/19 0238  NA 139 141 139 139  K 4.6 3.4* 3.4* 3.8  CL 98 105 107 106  CO2 '23 25 24 24  '$ GLUCOSE 119* 97 88 94  BUN 23 12 7* 7*  CREATININE 1.20* 1.00 0.98 0.96  CALCIUM 9.6 8.4* 8.5* 8.9  MG  --   --   --  1.5*  PHOS  --   --   --  2.9   GFR: Estimated Creatinine Clearance: 41.1 mL/min (by C-G formula based on SCr of 0.96 mg/dL). Liver Function Tests: Recent Labs  Lab 02/28/19 0300 03/01/19 0528 03/03/19 0238  AST '27 21 15  '$ ALT '10 17 15  '$ ALKPHOS 64 56 64  BILITOT 0.7 1.2 0.8  PROT 7.0 5.5* 5.7*  ALBUMIN 3.9 2.7* 2.6*   Recent Labs  Lab 02/28/19 0300  LIPASE 2,756*   No results for input(s): AMMONIA in the last 168 hours. Coagulation Profile: No results for input(s): INR, PROTIME in the last 168 hours. Cardiac Enzymes: No results for input(s): CKTOTAL, CKMB, CKMBINDEX, TROPONINI in the last 168 hours. BNP (last 3 results) No results for input(s): PROBNP in the last 8760 hours. HbA1C: No results for input(s): HGBA1C in the last 72 hours. CBG: No results for input(s): GLUCAP in the last 168 hours. Lipid Profile: No results for input(s): CHOL, HDL, LDLCALC, TRIG, CHOLHDL, LDLDIRECT in the last 72 hours. Thyroid Function Tests: No results for input(s): TSH, T4TOTAL, FREET4, T3FREE, THYROIDAB in the last 72 hours. Anemia Panel: No results for input(s): VITAMINB12, FOLATE, FERRITIN, TIBC, IRON, RETICCTPCT in the last 72 hours. Sepsis Labs: No results for input(s): PROCALCITON, LATICACIDVEN in the last 168 hours.  Recent Results (from the past 240 hour(s))  SARS CORONAVIRUS 2 (TAT 6-24 HRS) Nasopharyngeal Nasopharyngeal Swab     Status: None   Collection Time: 02/28/19  6:37 AM   Specimen: Nasopharyngeal Swab  Result Value Ref Range Status   SARS  Coronavirus 2 NEGATIVE NEGATIVE Final    Comment: (NOTE) SARS-CoV-2 target nucleic acids are NOT DETECTED. The SARS-CoV-2 RNA is generally detectable in upper and lower respiratory specimens during the acute phase of infection. Negative results do not preclude SARS-CoV-2 infection, do not rule out co-infections with other pathogens, and should not be used as the sole basis for treatment or other patient management decisions. Negative results must be combined with clinical observations, patient history, and epidemiological information. The expected result is Negative. Fact Sheet for Patients: SugarRoll.be Fact Sheet for Healthcare Providers: https://www.woods-mathews.com/ This test is not yet approved or cleared by the [place] FDA and  has been authorized for detection and/or diagnosis of SARS-CoV-2 by FDA under an Emergency Use Authorization (EUA). This EUA will remain  in effect (meaning this test can be used) for the duration of the COVID-19 declaration under Section 56 4(b)(1) of the Act, 21 U.S.C. section 360bbb-3(b)(1), unless the authorization is terminated or revoked sooner. Performed at [facility], [place]  [address], [place], [place] [address]     Radiology Studies: DG Abd 1 View  Result Date: 03/03/2019 CLINICAL DATA:  Abdominal pain EXAM: ABDOMEN - 1 VIEW COMPARISON:  02/28/2019 FINDINGS: Scattered large and small bowel gas is noted. No obstructive changes are seen. No free air is noted. No acute bony abnormality is seen. IMPRESSION: No acute abnormality noted. Electronically Signed   By: Inez Catalina M.D.   On: 03/03/2019 14:12   DG Foot 2 Views Right  Result Date: 03/03/2019 CLINICAL DATA:  Sudden onset right foot pain, no known injury, initial encounter EXAM: RIGHT FOOT - 2 VIEW COMPARISON:  None. FINDINGS: There is no evidence of fracture or dislocation. There is no evidence of arthropathy or other focal bone  abnormality. Soft tissues are unremarkable. IMPRESSION: No acute abnormality noted. Electronically Signed   By: Inez Catalina M.D.   On: 03/03/2019 14:13   VAS [place] LOWER EXTREMITY VENOUS (DVT)  Result Date: 03/03/2019  Lower Venous DVTStudy Indications: Pain, and Swelling.  Comparison Study: No prior study on file Performing Technologist: Sharion Dove RVS  Examination Guidelines: A complete evaluation includes B-mode imaging, spectral Doppler, color Doppler, and power Doppler as needed of all accessible portions of each vessel. Bilateral testing is considered an integral part of a complete examination. Limited examinations for reoccurring indications may be performed as noted. The reflux portion of the exam is performed with the patient in reverse Trendelenburg.  +---------+---------------+---------+-----------+----------+--------------+ RIGHT    CompressibilityPhasicitySpontaneityPropertiesThrombus Aging +---------+---------------+---------+-----------+----------+--------------+ CFV      Full           Yes      Yes                                 +---------+---------------+---------+-----------+----------+--------------+ SFJ      Full                                                        +---------+---------------+---------+-----------+----------+--------------+ FV Prox  Full                                                        +---------+---------------+---------+-----------+----------+--------------+ FV Mid   Full                                                        +---------+---------------+---------+-----------+----------+--------------+ FV DistalFull                                                        +---------+---------------+---------+-----------+----------+--------------+ PFV      Full                                                        +---------+---------------+---------+-----------+----------+--------------+  POP      Full           Yes       Yes                                 +---------+---------------+---------+-----------+----------+--------------+ PTV      Full                                                        +---------+---------------+---------+-----------+----------+--------------+ PERO     Full                                                        +---------+---------------+---------+-----------+----------+--------------+   +----+---------------+---------+-----------+----------+--------------+ LEFTCompressibilityPhasicitySpontaneityPropertiesThrombus Aging +----+---------------+---------+-----------+----------+--------------+ CFV Full           Yes      Yes                                 +----+---------------+---------+-----------+----------+--------------+     Summary: RIGHT: - There is no evidence of deep vein thrombosis in the lower extremity.  LEFT: - No evidence of common femoral vein obstruction.  *See table(s) above for measurements and observations.    Preliminary    Scheduled Meds: . aspirin EC  81 mg Oral Daily  . enoxaparin (LOVENOX) injection  40 mg Subcutaneous Q24H  . ketorolac  1 drop Both Eyes Daily  . lipase/protease/amylase  24,000 Units Oral TID AC  . pantoprazole  40 mg Oral Daily  . sodium chloride flush  3 mL Intravenous Q12H   Continuous Infusions:   LOS: 4 days   Time spent: [place], MD [facility]  To contact the attending provider between 7A-7P or the covering provider during after hours 7P-7A, please log into the web site [email or web address] and access using universal [place] password for that web site. If you do not have the password, please call the hospital operator.  03/04/2019, 12:04 PM

## 2019-03-05 LAB — CBC
HCT: 32.7 % — ABNORMAL LOW (ref 36.0–46.0)
Hemoglobin: 11.2 g/dL — ABNORMAL LOW (ref 12.0–15.0)
MCH: 30.7 pg (ref 26.0–34.0)
MCHC: 34.3 g/dL (ref 30.0–36.0)
MCV: 89.6 fL (ref 80.0–100.0)
Platelets: 188 10*3/uL (ref 150–400)
RBC: 3.65 MIL/uL — ABNORMAL LOW (ref 3.87–5.11)
RDW: 12.4 % (ref 11.5–15.5)
WBC: 9 10*3/uL (ref 4.0–10.5)
nRBC: 0 % (ref 0.0–0.2)

## 2019-03-05 LAB — BASIC METABOLIC PANEL
Anion gap: 9 (ref 5–15)
BUN: 7 mg/dL — ABNORMAL LOW (ref 8–23)
CO2: 25 mmol/L (ref 22–32)
Calcium: 8.9 mg/dL (ref 8.9–10.3)
Chloride: 105 mmol/L (ref 98–111)
Creatinine, Ser: 0.98 mg/dL (ref 0.44–1.00)
GFR calc Af Amer: >60 mL/min (ref >60)
GFR calc non Af Amer: 52 mL/min — ABNORMAL LOW (ref >60)
Glucose, Bld: 103 mg/dL — ABNORMAL HIGH (ref 70–99)
Potassium: 3.9 mmol/L (ref 3.5–5.1)
Sodium: 139 mmol/L (ref 135–145)

## 2019-03-05 LAB — HEPATIC FUNCTION PANEL
ALT: 20 U/L (ref 0–44)
AST: 39 U/L (ref 15–41)
Albumin: 2.6 g/dL — ABNORMAL LOW (ref 3.5–5.0)
Alkaline Phosphatase: 89 U/L (ref 38–126)
Bilirubin, Direct: 0.1 mg/dL (ref 0.0–0.2)
Indirect Bilirubin: 0.7 mg/dL (ref 0.3–0.9)
Total Bilirubin: 0.8 mg/dL (ref 0.3–1.2)
Total Protein: 5.8 g/dL — ABNORMAL LOW (ref 6.5–8.1)

## 2019-03-05 LAB — MAGNESIUM: Magnesium: 1.9 mg/dL (ref 1.7–2.4)

## 2019-03-05 MED ORDER — ACETAMINOPHEN 325 MG PO TABS: 650.0000 mg | ORAL_TABLET | Freq: Four times a day (QID) | ORAL | Status: DC | PRN

## 2019-03-05 MED ORDER — PANCRELIPASE (LIP-PROT-AMYL) 24000-76000 UNITS PO CPEP: 24000.0000 [IU] | ORAL_CAPSULE | Freq: Three times a day (TID) | ORAL | 0 refills | Status: DC

## 2019-03-05 NOTE — TOC Transition Note (Signed)
Transition of Care [facility]) - CM/SW Discharge Note   Patient Details  Name: Danielle Colon MRN: [id number] Date of Birth: [birth year]  [facility] of [facility] CM/SW Contact:  Doy Hutching, LCSW Phone Number: 03/05/2019, 1:01 PM   Clinical Narrative:    CSW called and spoke with pt who was discharging home with her daughter. Pt from home, confirmed home address and PCP. Discussed that [facility] PT is recommended pt interested in CSW calling to arrange this. CSW called Kandee Keen with [facility], he will review referral. Orders were entered by MD.    Final next level of care: Home w Home Health Services Barriers to Discharge: Barriers Resolved   Patient Goals and CMS Choice Patient states their goals for this hospitalization and ongoing recovery are:: get some therapy at home CMS Medicare.gov Compare Post Acute Care list provided to:: Patient Choice offered to / list presented to : Patient  Discharge Placement         Patient chooses bed at: Other - please specify in the comment section below:(home) Patient to be transferred to facility by: pt daughter will pick up Name of family member notified: pt daughter here to pick up pt Patient and family notified of of transfer: 03/05/19  Discharge Plan and Services Post Acute Care Choice: Home Health          [facility] Arranged: PT    Readmission Risk Interventions Readmission Risk Prevention Plan 03/05/2019  Transportation Screening Complete  PCP or Specialist Appt within 5-7 Days Not Complete  Home Care Screening Complete  Medication Review (RN CM) Referral to Pharmacy  Some recent data might be hidden

## 2019-03-05 NOTE — Discharge Summary (Signed)
Physician Discharge Summary  Danielle Colon [facility] OJJ:[id number] DOB: 12-21-1930 DOA: 02/28/2019  PCP: Daisy Floro, MD  Admit date: 02/28/2019 Discharge date: 03/05/2019  Admitted From: home Disposition:  home  Recommendations for Outpatient Follow-up:  1. Follow up with PCP in 1-2 weeks 2. Please obtain BMP/CBC in one week 3. Please follow up on the following pending results:  Home Health: yes PT Equipment/Devices:none  Discharge Condition:stable CODE STATUS:full Diet recommendation: Heart Healthy  Brief/Interim Summary: Patient is an 86 year old female with medical history of hypertension, hyperlipidemia, hepatitis, pancreatitis, TIA/CVA history, obesity, and GERD who presents with abdominal pain starting very early on 2/11. She had some nausea and vomiting. This is very similar to prior episode of pancreatitis in late 2019. Lipase 2756 with largely unremarkable labs otherwise. CT abdomen pelvis showed acutely edematous pancreatitis.Fluids were started and pain medicine provided. Usual course of IV fluids, pain control and diet restriction were initiated. After several days she did have improving pain. She was able to gradually increase diet to normal day prior to discharge. She was started on creon while in hospital and was discharged with this to take for at least 1 month post discharge. Educated on avoiding some dietary triggers to prevent recurrence. She did have some bilateral foot pain which was likely either pseudogout or arthritis and was successfully treated with tylenol. She was discharged in improved condition.   Discharge Diagnoses:  Principal Problem:   Pancreatitis Active Problems:   Benign hypertension   GERD (gastroesophageal reflux disease)   Hyperlipidemia   Obesity (BMI 30.0-34.9)   Hypoxia   Hypokalemia   Anemia  Discharge Instructions  Discharge Instructions    Call MD for:  persistant nausea and vomiting   Complete by: As directed    Call MD for:   severe uncontrolled pain   Complete by: As directed    Call MD for:  temperature >100.4   Complete by: As directed    Diet - low sodium heart healthy   Complete by: As directed    Increase activity slowly   Complete by: As directed      Allergies as of 03/05/2019      Reactions   Tessalon Perles [benzonatate] Other (See Comments)   Neurontin [gabapentin] Other (See Comments)   Zantac [ranitidine Hcl] Other (See Comments)   GI upset      Medication List    TAKE these medications   acetaminophen 325 MG tablet Commonly known as: TYLENOL Take 2 tablets (650 mg total) by mouth every 6 (six) hours as needed for mild pain (or Fever >/= 101).   ascorbic acid 500 MG tablet Commonly known as: VITAMIN C Take 500 mg by mouth daily.   aspirin EC 81 MG tablet Take 81 mg by mouth daily.   cholecalciferol 25 MCG (1000 UNIT) tablet Commonly known as: VITAMIN D3 Take 1,000 Units by mouth daily.   losartan-hydrochlorothiazide 100-25 MG tablet Commonly known as: HYZAAR Take 1 tablet by mouth daily.   nitroGLYCERIN 0.4 MG SL tablet Commonly known as: NITROSTAT Place 1 tablet (0.4 mg total) under the tongue every 5 (five) minutes as needed for chest pain.   Pancrelipase (Lip-Prot-Amyl) 24000-76000 units Cpep Take 1 capsule (24,000 Units total) by mouth 3 (three) times daily before meals.   pantoprazole 40 MG tablet Commonly known as: PROTONIX Take 1 tablet (40 mg total) by mouth 2 (two) times daily before a meal. What changed: when to take this   Prolensa 0.07 % Soln Generic drug: Bromfenac Sodium  Place 1 drop into both eyes daily.       Allergies  Allergen Reactions  . Tessalon Perles [Benzonatate] Other (See Comments)  . Neurontin [Gabapentin] Other (See Comments)  . Zantac [Ranitidine Hcl] Other (See Comments)    GI upset    Consultations:  none  Procedures/Studies: DG Abd 1 View  Result Date: 03/03/2019 CLINICAL DATA:  Abdominal pain EXAM: ABDOMEN - 1 VIEW  COMPARISON:  02/28/2019 FINDINGS: Scattered large and small bowel gas is noted. No obstructive changes are seen. No free air is noted. No acute bony abnormality is seen. IMPRESSION: No acute abnormality noted. Electronically Signed   By: Alcide Clever M.D.   On: 03/03/2019 14:12   CT ABDOMEN PELVIS W CONTRAST  Result Date: 02/28/2019 CLINICAL DATA:  Abdominal pain that began earlier tonight. History of pancreatitis EXAM: CT ABDOMEN AND PELVIS WITH CONTRAST TECHNIQUE: Multidetector CT imaging of the abdomen and pelvis was performed using the standard protocol following bolus administration of intravenous contrast. CONTRAST:  56mL OMNIPAQUE IOHEXOL 300 MG/ML  SOLN COMPARISON:  04/28/2005 FINDINGS: Lower chest: Small sliding hiatal hernia with gastroesophageal poor clearance or reflux. Mild atelectatic type opacity at the bases. Hepatobiliary: Prominent intrahepatic bile duct dimensions. No common bile duct dilatation. No visible calcified stone.No focal liver lesion. Pancreas: Diffuse expansion and peripancreatic edema without necrosis or collection seen. Suspect pancreas divisum. A small cystic dilatation is seen at the level of the distal main pancreatic duct. Spleen: Unremarkable. Adrenals/Urinary Tract: Negative adrenals. No hydronephrosis or stone. Small left renal cystic intensity. Unremarkable bladder. Stomach/Bowel: No primary bowel inflammation. There is moderate distension of the stomach. Mid duodenal diverticulum is seen. Vascular/Lymphatic: No acute vascular abnormality. No mass or adenopathy. Reproductive:No pathologic findings. Other: No ascites or pneumoperitoneum. Musculoskeletal: Facet degeneration in the lower lumbar spine with L5-S1 anterolisthesis. IMPRESSION: 1. Acute edematous pancreatitis without organized collection. There may be pancreas divisum. 2. Prominent intrahepatic bile ducts without common duct dilatation or calcified choledocholithiasis. 3. Fluid-filled stomach and small hiatal  hernia. Electronically Signed   By: Marnee Spring M.D.   On: 02/28/2019 05:31   DG Chest Port 1 View  Result Date: 02/28/2019 CLINICAL DATA:  Epigastric abdominal pain EXAM: PORTABLE CHEST 1 VIEW COMPARISON:  CTA chest 12/27/2018, chest radiograph 12/27/2018 FINDINGS: Stable bandlike area of scarring in the left mid lung. No consolidation, features of edema, pneumothorax, or effusion. The cardiomediastinal contours are unremarkable. No acute osseous or soft tissue abnormality. Degenerative changes are present in the imaged spine and shoulders. Telemetry leads overlie the chest. IMPRESSION: Stable scarring in the left mid lung. No acute cardiopulmonary abnormality. Electronically Signed   By: Kreg Shropshire M.D.   On: 02/28/2019 05:31   DG Foot 2 Views Right  Result Date: 03/03/2019 CLINICAL DATA:  Sudden onset right foot pain, no known injury, initial encounter EXAM: RIGHT FOOT - 2 VIEW COMPARISON:  None. FINDINGS: There is no evidence of fracture or dislocation. There is no evidence of arthropathy or other focal bone abnormality. Soft tissues are unremarkable. IMPRESSION: No acute abnormality noted. Electronically Signed   By: Alcide Clever M.D.   On: 03/03/2019 14:13   VAS [place] LOWER EXTREMITY VENOUS (DVT)  Result Date: 03/04/2019  Lower Venous DVTStudy Indications: Pain, and Swelling.  Comparison Study: No prior study on file Performing Technologist: Sherren Kerns RVS  Examination Guidelines: A complete evaluation includes B-mode imaging, spectral Doppler, color Doppler, and power Doppler as needed of all accessible portions of each vessel. Bilateral testing is considered an integral  part of a complete examination. Limited examinations for reoccurring indications may be performed as noted. The reflux portion of the exam is performed with the patient in reverse Trendelenburg.  +---------+---------------+---------+-----------+----------+--------------+ RIGHT     CompressibilityPhasicitySpontaneityPropertiesThrombus Aging +---------+---------------+---------+-----------+----------+--------------+ CFV      Full           Yes      Yes                                 +---------+---------------+---------+-----------+----------+--------------+ SFJ      Full                                                        +---------+---------------+---------+-----------+----------+--------------+ FV Prox  Full                                                        +---------+---------------+---------+-----------+----------+--------------+ FV Mid   Full                                                        +---------+---------------+---------+-----------+----------+--------------+ FV DistalFull                                                        +---------+---------------+---------+-----------+----------+--------------+ PFV      Full                                                        +---------+---------------+---------+-----------+----------+--------------+ POP      Full           Yes      Yes                                 +---------+---------------+---------+-----------+----------+--------------+ PTV      Full                                                        +---------+---------------+---------+-----------+----------+--------------+ PERO     Full                                                        +---------+---------------+---------+-----------+----------+--------------+   +----+---------------+---------+-----------+----------+--------------+ LEFTCompressibilityPhasicitySpontaneityPropertiesThrombus Aging +----+---------------+---------+-----------+----------+--------------+ CFV Full  Yes      Yes                                 +----+---------------+---------+-----------+----------+--------------+     Summary: RIGHT: - There is no evidence of deep vein thrombosis in the lower  extremity.  LEFT: - No evidence of common femoral vein obstruction.  *See table(s) above for measurements and observations. Electronically signed by Fabienne Brunsharles Fields MD on 03/04/2019 at 5:17:03 PM.    Final      Subjective: Still feeling well in her stomach. Intermittent pains in her feet which is alleviated by tylenol oral. Denies headaches, chest pains, SOB. Ready to go home today.   Discharge Exam: Vitals:   03/04/19 2332 03/05/19 0622  BP: (!) 182/79 (!) 172/64  Pulse: 91 81  Resp: 17 16  Temp: 99.2 F (37.3 C) 99.4 F (37.4 C)  SpO2: 92% 93%   Vitals:   03/04/19 1250 03/04/19 1845 03/04/19 2332 03/05/19 0622  BP: (!) 157/74 (!) 147/81 (!) 182/79 (!) 172/64  Pulse: 72 86 91 81  Resp: 18 16 17 16   Temp: 98 F (36.7 C) 98.9 F (37.2 C) 99.2 F (37.3 C) 99.4 F (37.4 C)  TempSrc: Oral Oral Oral Oral  SpO2: 96% 96% 92% 93%  Weight:      Height:        General: Pt is alert, awake, not in acute distress Cardiovascular: RRR, S1/S2 +, no rubs, no gallops Respiratory: CTA bilaterally, no wheezing, no rhonchi Abdominal: Soft, NT, ND, bowel sounds + Extremities: no edema, no cyanosis  The results of significant diagnostics from this hospitalization (including imaging, microbiology, ancillary and laboratory) are listed below for reference.     Microbiology: Recent Results (from the past 240 hour(s))  SARS CORONAVIRUS 2 (TAT 6-24 HRS) Nasopharyngeal Nasopharyngeal Swab     Status: None   Collection Time: 02/28/19  6:37 AM   Specimen: Nasopharyngeal Swab  Result Value Ref Range Status   SARS Coronavirus 2 NEGATIVE NEGATIVE Final    Comment: (NOTE) SARS-CoV-2 target nucleic acids are NOT DETECTED. The SARS-CoV-2 RNA is generally detectable in upper and lower respiratory specimens during the acute phase of infection. Negative results do not preclude SARS-CoV-2 infection, do not rule out co-infections with other pathogens, and should not be used as the sole basis for treatment  or other patient management decisions. Negative results must be combined with clinical observations, patient history, and epidemiological information. The expected result is Negative. Fact Sheet for Patients: HairSlick.nohttps://www.fda.gov/media/138098/download Fact Sheet for Healthcare Providers: quierodirigir.comhttps://www.fda.gov/media/138095/download This test is not yet approved or cleared by the [place] FDA and  has been authorized for detection and/or diagnosis of SARS-CoV-2 by FDA under an Emergency Use Authorization (EUA). This EUA will remain  in effect (meaning this test can be used) for the duration of the COVID-19 declaration under Section 56 4(b)(1) of the Act, 21 U.S.C. section 360bbb-3(b)(1), unless the authorization is terminated or revoked sooner. Performed at [facility], [address] [place], [place] [address]      Labs: BNP (last 3 results) Recent Labs    12/27/18 1647  BNP 96.0   Basic Metabolic Panel: Recent Labs  Lab 02/28/19 0300 03/01/19 0528 03/02/19 0555 03/03/19 0238 03/05/19 0412  NA 139 141 139 139 139  K 4.6 3.4* 3.4* 3.8 3.9  CL 98 105 107 106 105  CO2 23 25 24 24 25   GLUCOSE  119* 97 88 94 103*  BUN 23 12 7* 7* 7*  CREATININE 1.20* 1.00 0.98 0.96 0.98  CALCIUM 9.6 8.4* 8.5* 8.9 8.9  MG  --   --   --  1.5* 1.9  PHOS  --   --   --  2.9  --    Liver Function Tests: Recent Labs  Lab 02/28/19 0300 03/01/19 0528 03/03/19 0238 03/05/19 0412  AST 27 21 15  39  ALT 10 17 15 20   ALKPHOS 64 56 64 89  BILITOT 0.7 1.2 0.8 0.8  PROT 7.0 5.5* 5.7* 5.8*  ALBUMIN 3.9 2.7* 2.6* 2.6*   Recent Labs  Lab 02/28/19 0300  LIPASE 2,756*   No results for input(s): AMMONIA in the last 168 hours. CBC: Recent Labs  Lab 02/28/19 0300 02/28/19 0300 03/01/19 0528 03/02/19 0555 03/03/19 0238 03/04/19 1220 03/05/19 0412  WBC 9.4   < > 8.5 8.4 8.7 7.7 9.0  NEUTROABS 5.8  --   --   --   --   --   --   HGB 14.1   < > 11.7* 11.3* 11.5* 12.0 11.2*  HCT  45.2   < > 35.1* 34.4* 33.8* 35.5* 32.7*  MCV 97.2   < > 91.6 91.5 89.4 91.0 89.6  PLT 161   < > 170 159 163 181 188   < > = values in this interval not displayed.   Cardiac Enzymes: No results for input(s): CKTOTAL, CKMB, CKMBINDEX, TROPONINI in the last 168 hours. BNP: Invalid input(s): POCBNP CBG: No results for input(s): GLUCAP in the last 168 hours. D-Dimer No results for input(s): DDIMER in the last 72 hours. Hgb A1c No results for input(s): HGBA1C in the last 72 hours. Lipid Profile No results for input(s): CHOL, HDL, LDLCALC, TRIG, CHOLHDL, LDLDIRECT in the last 72 hours. Thyroid function studies No results for input(s): TSH, T4TOTAL, T3FREE, THYROIDAB in the last 72 hours.  Invalid input(s): FREET3 Anemia work up No results for input(s): VITAMINB12, FOLATE, FERRITIN, TIBC, IRON, RETICCTPCT in the last 72 hours. Urinalysis    Component Value Date/Time   COLORURINE COLORLESS (A) 02/28/2019 0345   APPEARANCEUR CLEAR 02/28/2019 0345   LABSPEC 1.013 02/28/2019 0345   PHURINE 6.0 02/28/2019 0345   GLUCOSEU NEGATIVE 02/28/2019 0345   HGBUR NEGATIVE 02/28/2019 0345   BILIRUBINUR NEGATIVE 02/28/2019 0345   KETONESUR NEGATIVE 02/28/2019 0345   PROTEINUR NEGATIVE 02/28/2019 0345   NITRITE NEGATIVE 02/28/2019 0345   LEUKOCYTESUR NEGATIVE 02/28/2019 0345   Sepsis Labs Invalid input(s): PROCALCITONIN,  WBC,  LACTICIDVEN Microbiology Recent Results (from the past 240 hour(s))  SARS CORONAVIRUS 2 (TAT 6-24 HRS) Nasopharyngeal Nasopharyngeal Swab     Status: None   Collection Time: 02/28/19  6:37 AM   Specimen: Nasopharyngeal Swab  Result Value Ref Range Status   SARS Coronavirus 2 NEGATIVE NEGATIVE Final    Comment: (NOTE) SARS-CoV-2 target nucleic acids are NOT DETECTED. The SARS-CoV-2 RNA is generally detectable in upper and lower respiratory specimens during the acute phase of infection. Negative results do not preclude SARS-CoV-2 infection, do not rule  out co-infections with other pathogens, and should not be used as the sole basis for treatment or other patient management decisions. Negative results must be combined with clinical observations, patient history, and epidemiological information. The expected result is Negative. Fact Sheet for Patients: SugarRoll.be Fact Sheet for Healthcare Providers: https://www.woods-mathews.com/ This test is not yet approved or cleared by the [place] FDA and  has been authorized for detection and/or  diagnosis of SARS-CoV-2 by FDA under an Emergency Use Authorization (EUA). This EUA will remain  in effect (meaning this test can be used) for the duration of the COVID-19 declaration under Section 56 4(b)(1) of the Act, 21 U.S.C. section 360bbb-3(b)(1), unless the authorization is terminated or revoked sooner. Performed at [facility], [address] [place], [place] [address]    Time coordinating discharge: Over 30 minutes  SIGNED:  Myrlene Broker, MD  [facility] 03/05/2019, 11:39 AM Pager   If 7PM-7AM, please contact night-coverage [email or web address] Password [email or web address]

## 2019-03-05 NOTE — Plan of Care (Signed)
Plan of care reviewed with pt at bedside. Discharge instructions complete. Pt will wait for daughter to pick her up. Call bell in reach. Will continue to monitor pt until dc. Safety measures in place.  Problem: Education: Goal: Knowledge of Pancreatitis treatment and prevention will improve Outcome: Completed/Met   Problem: Health Behavior/Discharge Planning: Goal: Ability to formulate a plan to maintain an alcohol-free life will improve Outcome: Completed/Met   Problem: Nutritional: Goal: Ability to achieve adequate nutritional intake will improve Outcome: Completed/Met   Problem: Clinical Measurements: Goal: Complications related to the disease process, condition or treatment will be avoided or minimized Outcome: Completed/Met   Problem: Education: Goal: Knowledge of General Education information will improve Description: Including pain rating scale, medication(s)/side effects and non-pharmacologic comfort measures Outcome: Completed/Met   Problem: Health Behavior/Discharge Planning: Goal: Ability to manage health-related needs will improve Outcome: Completed/Met   Problem: Clinical Measurements: Goal: Ability to maintain clinical measurements within normal limits will improve Outcome: Completed/Met Goal: Will remain free from infection Outcome: Completed/Met Goal: Diagnostic test results will improve Outcome: Completed/Met Goal: Respiratory complications will improve Outcome: Completed/Met Goal: Cardiovascular complication will be avoided Outcome: Completed/Met   Problem: Activity: Goal: Risk for activity intolerance will decrease Outcome: Completed/Met   Problem: Nutrition: Goal: Adequate nutrition will be maintained Outcome: Completed/Met   Problem: Coping: Goal: Level of anxiety will decrease Outcome: Completed/Met   Problem: Elimination: Goal: Will not experience complications related to bowel motility Outcome: Completed/Met Goal: Will not experience  complications related to urinary retention Outcome: Completed/Met   Problem: Pain Managment: Goal: General experience of comfort will improve Outcome: Completed/Met   Problem: Safety: Goal: Ability to remain free from injury will improve Outcome: Completed/Met   Problem: Skin Integrity: Goal: Risk for impaired skin integrity will decrease Outcome: Completed/Met

## 2019-03-05 NOTE — Progress Notes (Signed)
Physical Therapy Treatment Patient Details Name: Danielle Colon MRN: [id number] DOB: 06-11-1930 Today's Date: 03/05/2019    History of Present Illness Pt is an 86 y/o female admitted secondary to abdominal pain. Found to have pancreatitis. Pt also with R foot pain and complained of L big toe pain during session. RLE negative for DVT. PMH includes HTN, hepatitis, and CVA.     PT Comments    Pt received in bed. Continued c/o bilat great toe pain. She required min guard/supervision bed mobility, min guard transfers and min guard/supervision ambulation 100' with RW. Distance limited by bilat great toe pain. Pt returned to bed at end of session. Pt reports having all needed DME at home.   Follow Up Recommendations  Home health PT;Supervision for mobility/OOB     Equipment Recommendations  None recommended by PT    Recommendations for Other Services       Precautions / Restrictions Precautions Precautions: Fall Restrictions Weight Bearing Restrictions: No    Mobility  Bed Mobility   Bed Mobility: Supine to Sit;Sit to Supine     Supine to sit: Min guard;HOB elevated Sit to supine: Supervision;HOB elevated   General bed mobility comments: +rail, increased time  Transfers Overall transfer level: Needs assistance Equipment used: Rolling walker (2 wheeled) Transfers: Sit to/from Stand Sit to Stand: Min guard         General transfer comment: cues for hand placement, increased time to initiate  Ambulation/Gait Ambulation/Gait assistance: Min guard;Supervision Gait Distance (Feet): 100 Feet Assistive device: Rolling walker (2 wheeled) Gait Pattern/deviations: Step-through pattern;Decreased stride length;Antalgic Gait velocity: Decreased Gait velocity interpretation: <1.31 ft/sec, indicative of household ambulator General Gait Details: distance limited by bilat great toe pain   Stairs             Wheelchair Mobility    Modified Rankin (Stroke Patients  Only)       Balance Overall balance assessment: Needs assistance Sitting-balance support: No upper extremity supported;Feet supported Sitting balance-Leahy Scale: Good     Standing balance support: Bilateral upper extremity supported;During functional activity Standing balance-Leahy Scale: Poor Standing balance comment: Reliant on BUE support                             Cognition Arousal/Alertness: Awake/alert Behavior During Therapy: WFL for tasks assessed/performed Overall Cognitive Status: Within Functional Limits for tasks assessed                                        Exercises General Exercises - Lower Extremity Ankle Circles/Pumps: AROM;Both;10 reps;Supine    General Comments        Pertinent Vitals/Pain Pain Assessment: Faces Faces Pain Scale: Hurts even more Pain Location: bilateral big toes Pain Descriptors / Indicators: Grimacing;Guarding Pain Intervention(s): Monitored during session;Limited activity within patient's tolerance;Repositioned    Home Living                      Prior Function            PT Goals (current goals can now be found in the care plan section) Acute Rehab PT Goals Patient Stated Goal: to decrease pain  Progress towards PT goals: Progressing toward goals    Frequency    Min 3X/week      PT Plan Current plan remains appropriate    Co-evaluation  AM-PAC PT "6 Clicks" Mobility   Outcome Measure  Help needed turning from your back to your side while in a flat bed without using bedrails?: A Little Help needed moving from lying on your back to sitting on the side of a flat bed without using bedrails?: A Little Help needed moving to and from a bed to a chair (including a wheelchair)?: A Little Help needed standing up from a chair using your arms (e.g., wheelchair or bedside chair)?: A Little Help needed to walk in hospital room?: A Little Help needed climbing 3-5 steps  with a railing? : A Lot 6 Click Score: 17    End of Session Equipment Utilized During Treatment: Gait belt Activity Tolerance: Patient limited by pain Patient left: in bed;with call bell/phone within reach;with bed alarm set Nurse Communication: Mobility status PT Visit Diagnosis: Other abnormalities of gait and mobility (R26.89);Difficulty in walking, not elsewhere classified (R26.2);Pain Pain - part of body: Ankle and joints of foot     Time: 2878-6767 PT Time Calculation (min) (ACUTE ONLY): 24 min  Charges:  $Gait Training: 23-37 mins                     Lorrin Goodell, [place]  Office # [phone] Pager [phone]    Lorriane Shire 03/05/2019, 11:01 AM

## 2019-07-22 ENCOUNTER — Inpatient Hospital Stay (HOSPITAL_COMMUNITY): Payer: Medicare Other

## 2019-07-22 ENCOUNTER — Other Ambulatory Visit: Payer: Self-pay

## 2019-07-22 ENCOUNTER — Emergency Department (HOSPITAL_BASED_OUTPATIENT_CLINIC_OR_DEPARTMENT_OTHER): Payer: Medicare Other

## 2019-07-22 ENCOUNTER — Observation Stay (HOSPITAL_BASED_OUTPATIENT_CLINIC_OR_DEPARTMENT_OTHER)
Admission: EM | Admit: 2019-07-22 | Discharge: 2019-07-23 | Disposition: A | Discharge status: Home / Self Care | Payer: Medicare Other | Attending: Internal Medicine | Admitting: Internal Medicine

## 2019-07-22 ENCOUNTER — Encounter (HOSPITAL_BASED_OUTPATIENT_CLINIC_OR_DEPARTMENT_OTHER): Payer: Self-pay

## 2019-07-22 DIAGNOSIS — I340 Nonrheumatic mitral (valve) insufficiency: Secondary | ICD-10-CM | POA: Diagnosis not present

## 2019-07-22 DIAGNOSIS — I480 Paroxysmal atrial fibrillation: Principal | ICD-10-CM | POA: Insufficient documentation

## 2019-07-22 DIAGNOSIS — Z7982 Long term (current) use of aspirin: Secondary | ICD-10-CM | POA: Diagnosis not present

## 2019-07-22 DIAGNOSIS — K861 Other chronic pancreatitis: Secondary | ICD-10-CM | POA: Insufficient documentation

## 2019-07-22 DIAGNOSIS — R000 Tachycardia, unspecified: Secondary | ICD-10-CM | POA: Diagnosis present

## 2019-07-22 DIAGNOSIS — Z20822 Contact with and (suspected) exposure to covid-19: Secondary | ICD-10-CM | POA: Insufficient documentation

## 2019-07-22 DIAGNOSIS — R2681 Unsteadiness on feet: Secondary | ICD-10-CM | POA: Insufficient documentation

## 2019-07-22 DIAGNOSIS — Z79899 Other long term (current) drug therapy: Secondary | ICD-10-CM | POA: Insufficient documentation

## 2019-07-22 DIAGNOSIS — Z8673 Personal history of transient ischemic attack (TIA), and cerebral infarction without residual deficits: Secondary | ICD-10-CM | POA: Insufficient documentation

## 2019-07-22 DIAGNOSIS — I10 Essential (primary) hypertension: Secondary | ICD-10-CM

## 2019-07-22 DIAGNOSIS — E669 Obesity, unspecified: Secondary | ICD-10-CM | POA: Insufficient documentation

## 2019-07-22 DIAGNOSIS — R42 Dizziness and giddiness: Secondary | ICD-10-CM | POA: Diagnosis not present

## 2019-07-22 DIAGNOSIS — I119 Hypertensive heart disease without heart failure: Secondary | ICD-10-CM | POA: Diagnosis not present

## 2019-07-22 DIAGNOSIS — R55 Syncope and collapse: Secondary | ICD-10-CM | POA: Diagnosis not present

## 2019-07-22 DIAGNOSIS — E785 Hyperlipidemia, unspecified: Secondary | ICD-10-CM | POA: Diagnosis not present

## 2019-07-22 DIAGNOSIS — I482 Chronic atrial fibrillation, unspecified: Secondary | ICD-10-CM | POA: Insufficient documentation

## 2019-07-22 DIAGNOSIS — K219 Gastro-esophageal reflux disease without esophagitis: Secondary | ICD-10-CM | POA: Diagnosis not present

## 2019-07-22 DIAGNOSIS — I4891 Unspecified atrial fibrillation: Secondary | ICD-10-CM | POA: Diagnosis not present

## 2019-07-22 DIAGNOSIS — Z66 Do not resuscitate: Secondary | ICD-10-CM | POA: Diagnosis not present

## 2019-07-22 DIAGNOSIS — Z6830 Body mass index (BMI) 30.0-30.9, adult: Secondary | ICD-10-CM | POA: Diagnosis not present

## 2019-07-22 LAB — COMPREHENSIVE METABOLIC PANEL
ALT: 14 U/L (ref 0–44)
AST: 19 U/L (ref 15–41)
Albumin: 4 g/dL (ref 3.5–5.0)
Alkaline Phosphatase: 72 U/L (ref 38–126)
Anion gap: 10 (ref 5–15)
BUN: 21 mg/dL (ref 8–23)
CO2: 26 mmol/L (ref 22–32)
Calcium: 9.7 mg/dL (ref 8.9–10.3)
Chloride: 97 mmol/L — ABNORMAL LOW (ref 98–111)
Creatinine, Ser: 1.29 mg/dL — ABNORMAL HIGH (ref 0.44–1.00)
GFR calc Af Amer: 43 mL/min — ABNORMAL LOW (ref >60)
GFR calc non Af Amer: 37 mL/min — ABNORMAL LOW (ref >60)
Glucose, Bld: 120 mg/dL — ABNORMAL HIGH (ref 70–99)
Potassium: 3.5 mmol/L (ref 3.5–5.1)
Sodium: 133 mmol/L — ABNORMAL LOW (ref 135–145)
Total Bilirubin: 0.7 mg/dL (ref 0.3–1.2)
Total Protein: 7.3 g/dL (ref 6.5–8.1)

## 2019-07-22 LAB — URINALYSIS, ROUTINE W REFLEX MICROSCOPIC
Bilirubin Urine: NEGATIVE
Glucose, UA: NEGATIVE mg/dL
Hgb urine dipstick: NEGATIVE
Ketones, ur: NEGATIVE mg/dL
Nitrite: NEGATIVE
Protein, ur: NEGATIVE mg/dL
Specific Gravity, Urine: <1.005 — ABNORMAL LOW (ref 1.005–1.030)
pH: 6.5 (ref 5.0–8.0)

## 2019-07-22 LAB — URINALYSIS, MICROSCOPIC (REFLEX)

## 2019-07-22 LAB — CBC
HCT: 39.6 % (ref 36.0–46.0)
Hemoglobin: 13 g/dL (ref 12.0–15.0)
MCH: 30.3 pg (ref 26.0–34.0)
MCHC: 32.8 g/dL (ref 30.0–36.0)
MCV: 92.3 fL (ref 80.0–100.0)
Platelets: 190 10*3/uL (ref 150–400)
RBC: 4.29 MIL/uL (ref 3.87–5.11)
RDW: 12.9 % (ref 11.5–15.5)
WBC: 8.7 10*3/uL (ref 4.0–10.5)
nRBC: 0 % (ref 0.0–0.2)

## 2019-07-22 LAB — TSH: TSH: 4.553 u[IU]/mL — ABNORMAL HIGH (ref 0.350–4.500)

## 2019-07-22 LAB — CBG MONITORING, ED: Glucose-Capillary: 115 mg/dL — ABNORMAL HIGH (ref 70–99)

## 2019-07-22 LAB — MAGNESIUM: Magnesium: 1.4 mg/dL — ABNORMAL LOW (ref 1.7–2.4)

## 2019-07-22 LAB — SARS CORONAVIRUS 2 BY RT PCR (HOSPITAL ORDER, PERFORMED IN ~~LOC~~ HOSPITAL LAB): SARS Coronavirus 2: NEGATIVE

## 2019-07-22 MED ORDER — LOSARTAN POTASSIUM 50 MG PO TABS
50.0000 mg | ORAL_TABLET | Freq: Every day | ORAL | Status: DC
  Administered 2019-07-23: 50 mg via ORAL
  Filled 2019-07-22: qty 1

## 2019-07-22 MED ORDER — APIXABAN 2.5 MG PO TABS: 2.5000 mg | ORAL_TABLET | Freq: Two times a day (BID) | ORAL | Status: DC

## 2019-07-22 MED ORDER — APIXABAN 5 MG PO TABS
5.0000 mg | ORAL_TABLET | Freq: Two times a day (BID) | ORAL | Status: DC
  Administered 2019-07-23 (×2): 5 mg via ORAL
  Filled 2019-07-22 (×2): qty 1

## 2019-07-22 MED ORDER — HYDROCHLOROTHIAZIDE 25 MG PO TABS: 25.0000 mg | ORAL_TABLET | Freq: Every day | ORAL | Status: DC

## 2019-07-22 MED ORDER — SODIUM CHLORIDE 0.9 % IV BOLUS
500.0000 mL | Freq: Once | INTRAVENOUS | Status: AC
  Administered 2019-07-22: 500 mL via INTRAVENOUS

## 2019-07-22 MED ORDER — ACETAMINOPHEN 325 MG PO TABS: 650.0000 mg | ORAL_TABLET | Freq: Four times a day (QID) | ORAL | Status: DC | PRN

## 2019-07-22 MED ORDER — LOSARTAN POTASSIUM 50 MG PO TABS: 100.0000 mg | ORAL_TABLET | Freq: Every day | ORAL | Status: DC

## 2019-07-22 MED ORDER — DILTIAZEM HCL 60 MG PO TABS
60.0000 mg | ORAL_TABLET | Freq: Three times a day (TID) | ORAL | Status: DC
  Administered 2019-07-22 – 2019-07-23 (×2): 60 mg via ORAL
  Filled 2019-07-22 (×2): qty 1

## 2019-07-22 MED ORDER — SODIUM CHLORIDE 0.9% FLUSH
3.0000 mL | Freq: Once | INTRAVENOUS | Status: DC
  Filled 2019-07-22: qty 3

## 2019-07-22 MED ORDER — ONDANSETRON HCL 4 MG/2ML IJ SOLN: 4.0000 mg | Freq: Four times a day (QID) | INTRAMUSCULAR | Status: DC | PRN

## 2019-07-22 MED ORDER — DILTIAZEM HCL-DEXTROSE 125-5 MG/125ML-% IV SOLN (PREMIX)
5.0000 mg/h | INTRAVENOUS | Status: DC
  Administered 2019-07-22: 5 mg/h via INTRAVENOUS
  Filled 2019-07-22: qty 125

## 2019-07-22 MED ORDER — LOSARTAN POTASSIUM-HCTZ 100-25 MG PO TABS: 1.0000 | ORAL_TABLET | Freq: Every day | ORAL | Status: DC

## 2019-07-22 MED ORDER — DILTIAZEM LOAD VIA INFUSION
5.0000 mg | Freq: Once | INTRAVENOUS | Status: AC
  Administered 2019-07-22: 5 mg via INTRAVENOUS
  Filled 2019-07-22: qty 5

## 2019-07-22 MED ORDER — PANTOPRAZOLE SODIUM 40 MG PO TBEC
40.0000 mg | DELAYED_RELEASE_TABLET | Freq: Every day | ORAL | Status: DC
  Administered 2019-07-22 – 2019-07-23 (×2): 40 mg via ORAL
  Filled 2019-07-22 (×2): qty 1

## 2019-07-22 MED ORDER — PANCRELIPASE (LIP-PROT-AMYL) 12000-38000 UNITS PO CPEP
24000.0000 [IU] | ORAL_CAPSULE | Freq: Three times a day (TID) | ORAL | Status: DC
  Administered 2019-07-23 (×2): 24000 [IU] via ORAL
  Filled 2019-07-22 (×2): qty 2

## 2019-07-22 NOTE — ED Provider Notes (Signed)
[facility] EMERGENCY DEPARTMENT Provider Note   CSN: [id number] Arrival date & time: 07/22/19  1256     History Chief Complaint  Patient presents with  . Loss of Consciousness    Danielle Colon is a 86 y.o. female presenting for evaluation of syncopal event.  Patient states she got up from her bed today, and the next thing she remembers is being on the floor with her daughter standing over her.  She denies feeling poorly prior to the syncopal event.  She denies feeling lightheaded, dizzy, chest pain, shortness of breath.  She states over the past several months, she has been feeling overall fatigued.  It has not changed recently.  She intermittently does feel lightheaded when going from sitting to standing.  She has ambulated since without difficulty. Pt denies CP, current SOB, or palpitations. She denies head pain, neck pain, nausea, vomiting abdominal pain, urinary symptoms, abnormal bowel movements.  Denies numbness or tingling. She is not on blood thinners.  She states she saw a cardiologist in [place] a few months ago, but never followed up or got the tests recommended.   Additional history obtained from pt's daughter, lis. She states she was in the house, heard a thud and found her mom on the floor. Pt was unconscious for less than 1 minute.   Additional history taken from chart review.  Patient with a history of Bell's palsy, TIA, GERD, hypertension, hyperlipidemia, pancreatitis.  HPI     Past Medical History:  Diagnosis Date  . Bell's palsy 2009;    on the right  . Brain TIA    pt denies this hx on 07/18/2013; "they thought it was a stroke but dr said later that it was Bell's Palsy"  . Cerebrovascular disease   . GERD (gastroesophageal reflux disease)   . Hepatitis    "don't remember what kind; had it ~ 3 times when I was younger; last time was in the 1990's"  . Hyperlipidemia   . Hypertension   . Pancreatitis     Patient Active Problem List   Diagnosis  Date Noted  . Atrial fibrillation (HCC) 07/22/2019  . Hypokalemia 03/01/2019  . Anemia 03/01/2019  . Pancreatitis 02/28/2019  . Hyperlipidemia 02/28/2019  . Obesity (BMI 30.0-34.9) 02/28/2019  . Hypoxia 02/28/2019  . GERD (gastroesophageal reflux disease) 07/19/2013  . Urinary incontinence 07/19/2013  . Lung nodule seen on imaging study 07/19/2013  . Benign hypertension 07/18/2013  . History of Bell's palsy 06/27/2013    Past Surgical History:  Procedure Laterality Date  . APPENDECTOMY  ~ 1971  . CATARACT EXTRACTION Right   . CHOLECYSTECTOMY  ~ 1971  . TONSILLECTOMY  1949     OB History   No obstetric history on file.     Family History  Problem Relation Age of Onset  . Pneumonia Mother        old age  . Heart attack Father   . Diabetes Father   . Diabetes Sister   . Diabetes Brother   . Cancer Sister   . Diabetes Sister   . Diabetes Sister     Social History   Tobacco Use  . Smoking status: Never Smoker  . Smokeless tobacco: Never Used  Vaping Use  . Vaping Use: Never used  Substance Use Topics  . Alcohol use: No  . Drug use: No    Home Medications Prior to Admission medications   Medication Sig Start Date End Date Taking? Authorizing Provider  acetaminophen (TYLENOL)  325 MG tablet Take 2 tablets (650 mg total) by mouth every 6 (six) hours as needed for mild pain (or Fever >/= 101). 03/05/19   Myrlene Brokerrawford, Elizabeth A, MD  ascorbic acid (VITAMIN C) 500 MG tablet Take 500 mg by mouth daily.    [provider]  aspirin EC 81 MG tablet Take 81 mg by mouth daily.    [provider]  cholecalciferol (VITAMIN D3) 25 MCG (1000 UNIT) tablet Take 1,000 Units by mouth daily.    [provider]  lipase/protease/amylase 24000-76000 units CPEP Take 1 capsule (24,000 Units total) by mouth 3 (three) times daily before meals. 03/05/19   Myrlene Brokerrawford, Elizabeth A, MD  losartan-hydrochlorothiazide (HYZAAR) 100-25 MG tablet Take 1 tablet by mouth daily.  10/22/18   [provider]  nitroGLYCERIN (NITROSTAT) 0.4 MG SL tablet Place 1 tablet (0.4 mg total) under the tongue every 5 (five) minutes as needed for chest pain. Patient not taking: Reported on 02/28/2019 07/19/13   Rai, Delene Ruffiniipudeep K, MD  pantoprazole (PROTONIX) 40 MG tablet Take 1 tablet (40 mg total) by mouth 2 (two) times daily before a meal. Patient taking differently: Take 40 mg by mouth daily.  07/19/13   Rai, Ripudeep K, MD  PROLENSA 0.07 % SOLN Place 1 drop into both eyes daily. 02/12/19   [provider]    Allergies    Tessalon perles [benzonatate], Neurontin [gabapentin], and Zantac [ranitidine hcl]  Review of Systems   Review of Systems  Neurological: Positive for syncope.  All other systems reviewed and are negative.   Physical Exam Updated Vital Signs BP 110/68 (BP Location: Right Arm)   Pulse (!) 117   Temp 98.2 F (36.8 C) (Oral)   Resp (!) 23   Ht 5\' 2"  (1.575 m)   Wt 70 kg   SpO2 98%   BMI 28.23 kg/m   Physical Exam Vitals and nursing note reviewed.  Constitutional:      General: She is not in acute distress.    Appearance: She is well-developed.     Comments: Resting in the bed in NAD  HENT:     Head: Normocephalic and atraumatic.  Eyes:     Extraocular Movements: Extraocular movements intact.     Conjunctiva/sclera: Conjunctivae normal.     Pupils: Pupils are equal, round, and reactive to light.  Cardiovascular:     Rate and Rhythm: Tachycardia present. Rhythm irregular.     Pulses: Normal pulses.     Comments: irreg irreg, mostly 110-120, up to 150.  Pulmonary:     Effort: Pulmonary effort is normal. No respiratory distress.     Breath sounds: Normal breath sounds. No wheezing.     Comments: Clear lung sounds Abdominal:     General: There is no distension.     Palpations: Abdomen is soft. There is no mass.     Tenderness: There is no abdominal tenderness. There is no guarding or rebound.  Musculoskeletal:        General: Normal  range of motion.     Cervical back: Normal range of motion and neck supple.     Right lower leg: Edema present.     Comments: R leg swelling, baseline per pt.  Skin:    General: Skin is warm and dry.     Capillary Refill: Capillary refill takes less than 2 seconds.  Neurological:     Mental Status: She is alert and oriented to person, place, and time.     GCS: GCS  eye subscore is 4. GCS verbal subscore is 5. GCS motor subscore is 6.     Motor: Motor function is intact.     Comments: R side facial droop. Baseline per pt (2/2 Bells palsy)     ED Results / Procedures / Treatments   Labs (all labs ordered are listed, but only abnormal results are displayed) Labs Reviewed  COMPREHENSIVE METABOLIC PANEL - Abnormal; Notable for the following components:      Result Value   Sodium 133 (*)    Chloride 97 (*)    Glucose, Bld 120 (*)    Creatinine, Ser 1.29 (*)    GFR calc non Af Amer 37 (*)    GFR calc Af Amer 43 (*)    All other components within normal limits  TSH - Abnormal; Notable for the following components:   TSH 4.553 (*)    All other components within normal limits  MAGNESIUM - Abnormal; Notable for the following components:   Magnesium 1.4 (*)    All other components within normal limits  CBG MONITORING, ED - Abnormal; Notable for the following components:   Glucose-Capillary 115 (*)    All other components within normal limits  SARS CORONAVIRUS 2 BY RT PCR (HOSPITAL ORDER, PERFORMED IN [facility])  CBC  URINALYSIS, ROUTINE W REFLEX MICROSCOPIC    EKG EKG Interpretation  Date/Time:  Monday July 22 2019 13:05:32 EDT Ventricular Rate:  124 PR Interval:    QRS Duration: 70 QT Interval:  298 QTC Calculation: 428 R Axis:   -40 Text Interpretation: Atrial fibrillation with rapid ventricular response Left axis deviation Low voltage QRS Septal infarct , age undetermined Abnormal ECG Confirmed by Benjiman Core [phone]) on 07/22/2019 1:14:55  PM   Radiology DG Chest Port 1 View  Result Date: 07/22/2019 CLINICAL DATA:  Feeling weak EXAM: PORTABLE CHEST 1 VIEW COMPARISON:  February 28, 2019 FINDINGS: The heart size and mediastinal contours are within normal limits. Both lungs are clear. Mild linear scar is identified in the left mid lung. The visualized skeletal structures are unremarkable. IMPRESSION: No active disease. Electronically Signed   By: Sherian Rein M.D.   On: 07/22/2019 14:08    Procedures .Critical Care Performed by: Alveria Apley, PA-C Authorized by: Alveria Apley, PA-C   Critical care provider statement:    Critical care time (minutes):  50   Critical care time was exclusive of:  Separately billable procedures and treating other patients and teaching time   Critical care was necessary to treat or prevent imminent or life-threatening deterioration of the following conditions:  Cardiac failure   Critical care was time spent personally by me on the following activities:  Blood draw for specimens, development of treatment plan with patient or surrogate, discussions with consultants, evaluation of patient's response to treatment, examination of patient, obtaining history from patient or surrogate, ordering and review of laboratory studies, ordering and performing treatments and interventions, ordering and review of radiographic studies, pulse oximetry, re-evaluation of patient's condition and review of old charts   I assumed direction of critical care for this patient from another provider in my specialty: no   Comments:     Pt in a fib with rvr requiring dilt gtt and admission.    (including critical care time)  Medications Ordered in ED Medications  sodium chloride flush (NS) 0.9 % injection 3 mL (3 mLs Intravenous Not Given 07/22/19 1321)  diltiazem (CARDIZEM) 1 mg/mL load via infusion 5 mg (5 mg Intravenous Bolus  from Bag 07/22/19 1403)    And  diltiazem (CARDIZEM) 125 mg in dextrose 5% 125 mL (1 mg/mL)  infusion (5 mg/hr Intravenous New Bag/Given 07/22/19 1402)  sodium chloride 0.9 % bolus 500 mL ( Intravenous Stopped 07/22/19 1440)    ED Course  I have reviewed the triage vital signs and the nursing notes.  Pertinent labs & imaging results that were available during my care of the patient were reviewed by me and considered in my medical decision making (see chart for details).    MDM Rules/Calculators/A&P                          Patient presenting for evaluation after syncopal event.  On exam, patient appears nontoxic.  She denies pain or injury from the fall.  She has ambulated since, as such, I do not believe she needs x-rays of her pelvis or chest. She reports no HA, is not on anticoagulation, will hold on head CT. Pt is in afib with RVR, will check labs, UA, cxr. Will start fluids for possible dehydration. Will start dilt gtt. unknown when afib started and pt not on anticoagulation, not a cardioversion candidate currently. Her BP and vitals are stable.   Labs show may be some mild dehydration, creatinine 1.3, baseline 0.9.  Otherwise reassuring.  Patient's TSH is very mildly elevated.  Reviewed interpreted by me, no pneumonia pneumothorax, effusion.  EKG shows A. fib with RVR.  Will consult with cardiology. Case discussed with attending, Dr. Rubin Payor evaluated hte pt.   On tilt drip, patient's heart rate improved to 80-90, still in A. fib.  Discussed with Dr. Wyline Mood from cardiology, who requests medicine admit, cardiology will consult.  States they will decide anticoagulation when they consult on the pt.   discussed with Dr. Chipper Herb from [facility], pt to be admitted.   Final Clinical Impression(s) / ED Diagnoses Final diagnoses:  Atrial fibrillation with RVR (HCC)  Syncope, unspecified syncope type    Rx / DC Orders ED Discharge Orders         Ordered    Amb referral to AFIB Clinic     Discontinue  Reprint     07/22/19 [address]  PA-C 07/22/19 1540    Benjiman Core, MD 07/22/19 1544

## 2019-07-22 NOTE — H&P (Signed)
History and Physical    [facility] IOX:[id number] DOB: 10-18-1930 DOA: 07/22/2019  PCP: Daisy Floro, MD (Confirm with patient/family/NH records and if not entered, this has to be entered at [facility] point of entry) Patient coming from: home  I have personally briefly reviewed patient's old medical records in [facility]  Chief Complaint: syncope, tachycardia  HPI: Danielle Colon is a 86 y.o. female with medical history significant of Bell's Palsy, HLD, HTN, GERD, pancreatitis, TIA. She was last seen by cardiology 12/1118 in f/u for chest pain and SOB. Echo 7/2/120 with EF 60-65%, grade I diastolic dysfunction. At that visit nuclear stress testing was recommended by the patient did not follow through with scheduled testing. She had had a previous admission for chest pain in 2015 which was negative.  Patient states she got up from her bed today, and the next thing she remembers is being on the floor with her daughter standing over her.  She denies feeling poorly prior to the syncopal event.  She denies feeling dizzy, chest pain, shortness of breath.  She does have a history of feeling light=headed with position change in the past. She states over the past several months, she has been feeling overall fatigued.  It has not changed recently.  She intermittently does feel lightheaded when going from sitting to standing.  She has ambulated since without difficulty. Pt denies CP, current SOB, or palpitations. She denies head pain, neck pain, nausea, vomiting abdominal pain, urinary symptoms, abnormal bowel movements.  Denies numbness or tingling. She is not on blood thinners.  She states she saw a cardiologist in [place] a few months ago, but never followed up or got the tests recommended.   ED Course: Seen [facility]-ED: afebrile VSS except for tachycardia. Exam revealed IRIR rate and R LE swellilng - which is chronic with negative LE Venous Doppler 03/03/19. EKG revealed A Fib with RVR. Diltiazem  qtt started. Cardiology called and requested medicine admit and they would see patient in consultation. [facility] accepted patient.  Review of Systems: As per HPI otherwise 10 point review of systems negative. Reports intermittent tingling in the right arm for the past week but no frank weakness  Past Medical History:  Diagnosis Date  . Bell's palsy 2009;    on the right  . Brain TIA    pt denies this hx on 07/18/2013; "they thought it was a stroke but dr said later that it was Bell's Palsy"  . Cerebrovascular disease   . GERD (gastroesophageal reflux disease)   . Hepatitis    "don't remember what kind; had it ~ 3 times when I was younger; last time was in the 1990's"  . Hyperlipidemia   . Hypertension   . Pancreatitis     Past Surgical History:  Procedure Laterality Date  . APPENDECTOMY  ~ 1971  . CATARACT EXTRACTION Right   . CHOLECYSTECTOMY  ~ 1971  . TONSILLECTOMY  1949   Soc Hx - married x 40 yrs then widowed. Had a difficult marriage. She had two sons, two daughters, 6 grandchildren. 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 She lives with her daughter. She is "tough: and independent in ADLs   reports that she has never smoked. She has never used smokeless tobacco. She reports that she does not drink alcohol and does not use drugs.  Allergies  Allergen Reactions  . Tessalon Perles [Benzonatate] Other (See Comments)  .  Neurontin [Gabapentin] Other (See Comments)  . Zantac [Ranitidine Hcl] Other (See Comments)    GI upset    Family History  Problem Relation Age of Onset  . Pneumonia Mother        old age  . Heart attack Father   . Diabetes Father   . Diabetes Sister   . Diabetes Brother   . Cancer Sister   . Diabetes Sister   . Diabetes Sister      Prior to Admission medications   Medication Sig Start Date End Date Taking? Authorizing Provider  acetaminophen (TYLENOL) 325 MG tablet Take 2 tablets (650 mg  total) by mouth every 6 (six) hours as needed for mild pain (or Fever >/= 101). 03/05/19   Myrlene Brokerrawford, Elizabeth A, MD  ascorbic acid (VITAMIN C) 500 MG tablet Take 500 mg by mouth daily.    [provider]  aspirin EC 81 MG tablet Take 81 mg by mouth daily.    [provider]  cholecalciferol (VITAMIN D3) 25 MCG (1000 UNIT) tablet Take 1,000 Units by mouth daily.    [provider]  lipase/protease/amylase 24000-76000 units CPEP Take 1 capsule (24,000 Units total) by mouth 3 (three) times daily before meals. 03/05/19   Myrlene Brokerrawford, Elizabeth A, MD  losartan-hydrochlorothiazide (HYZAAR) 100-25 MG tablet Take 1 tablet by mouth daily. 10/22/18   [provider]  nitroGLYCERIN (NITROSTAT) 0.4 MG SL tablet Place 1 tablet (0.4 mg total) under the tongue every 5 (five) minutes as needed for chest pain. Patient not taking: Reported on 02/28/2019 07/19/13   Rai, Delene Ruffiniipudeep K, MD  pantoprazole (PROTONIX) 40 MG tablet Take 1 tablet (40 mg total) by mouth 2 (two) times daily before a meal. Patient taking differently: Take 40 mg by mouth daily.  07/19/13   Rai, Ripudeep K, MD  PROLENSA 0.07 % SOLN Place 1 drop into both eyes daily. 02/12/19   [provider]    Physical Exam: Vitals:   07/22/19 1652 07/22/19 1700 07/22/19 1823 07/22/19 1832  BP: 100/70 100/64    Pulse: 94 76 81   Resp: 18 12 16    Temp:   98.9 F (37.2 C)   TempSrc:   Oral   SpO2: 100% 100% 100%   Weight:    69.3 kg  Height:    5\' 2"  (1.575 m)    Constitutional: NAD, calm, comfortable Vitals:   07/22/19 1652 07/22/19 1700 07/22/19 1823 07/22/19 1832  BP: 100/70 100/64    Pulse: 94 76 81   Resp: 18 12 16    Temp:   98.9 F (37.2 C)   TempSrc:   Oral   SpO2: 100% 100% 100%   Weight:    69.3 kg  Height:    5\' 2"  (1.575 m)   General -elderly woman in no distress, cooperative and conversant. Eyes: PERRL, OD ptosis 2/2 Bell's Palsy ENMT: Mucous membranes are moist. Posterior pharynx clear of any  exudate or lesions.Edentulous Maxilla, native dentition mandilbe  Neck: normal, supple, no masses, no thyromegaly Respiratory: clear to auscultation bilaterally, no wheezing, no crackles. Normal respiratory effort. No accessory muscle use.  Cardiovascular: during exam [facility] from regular to [facility], no murmurs / rubs / gallops. No extremity edema. 1+ pedal pulses. No carotid bruits.  Abdomen: no tenderness, no masses palpated. No hepatosplenomegaly. Bowel sounds positive.  Musculoskeletal: no clubbing / cyanosis. PIP/DIP changes hands 2/2 OA.  Good ROM, no contractures. Normal muscle tone.  Skin: no rashes, lesions, ulcers. No induration Neurologic: CN 2-12 right  facial droop and OD ptosis, facial muscles move well. EOMI, no fasiculation tongue. . Sensation intact,  Strength 5/5 in all 4 with excellent bilateral grip strength  Psychiatric: Normal judgment and insight. Alert and oriented x 3. Normal mood.     Labs on Admission: I have personally reviewed following labs and imaging studies  CBC: Recent Labs  Lab 07/22/19 1329  WBC 8.7  HGB 13.0  HCT 39.6  MCV 92.3  PLT 190   Basic Metabolic Panel: Recent Labs  Lab 07/22/19 1329  NA 133*  K 3.5  CL 97*  CO2 26  GLUCOSE 120*  BUN 21  CREATININE 1.29*  CALCIUM 9.7  MG 1.4*   GFR: Estimated Creatinine Clearance: 28 mL/min (A) (by C-G formula based on SCr of 1.29 mg/dL (H)). Liver Function Tests: Recent Labs  Lab 07/22/19 1329  AST 19  ALT 14  ALKPHOS 72  BILITOT 0.7  PROT 7.3  ALBUMIN 4.0   No results for input(s): LIPASE, AMYLASE in the last 168 hours. No results for input(s): AMMONIA in the last 168 hours. Coagulation Profile: No results for input(s): INR, PROTIME in the last 168 hours. Cardiac Enzymes: No results for input(s): CKTOTAL, CKMB, CKMBINDEX, TROPONINI in the last 168 hours. BNP (last 3 results) No results for input(s): PROBNP in the last 8760 hours. HbA1C: No results for input(s): HGBA1C in the last 72  hours. CBG: Recent Labs  Lab 07/22/19 1331  GLUCAP 115*   Lipid Profile: No results for input(s): CHOL, HDL, LDLCALC, TRIG, CHOLHDL, LDLDIRECT in the last 72 hours. Thyroid Function Tests: Recent Labs    07/22/19 1329  TSH 4.553*   Anemia Panel: No results for input(s): VITAMINB12, FOLATE, FERRITIN, TIBC, IRON, RETICCTPCT in the last 72 hours. Urine analysis:    Component Value Date/Time   COLORURINE STRAW (A) 07/22/2019 1538   APPEARANCEUR CLEAR 07/22/2019 1538   LABSPEC <1.005 (L) 07/22/2019 1538   PHURINE 6.5 07/22/2019 1538   GLUCOSEU NEGATIVE 07/22/2019 1538   HGBUR NEGATIVE 07/22/2019 1538   BILIRUBINUR NEGATIVE 07/22/2019 1538   KETONESUR NEGATIVE 07/22/2019 1538   PROTEINUR NEGATIVE 07/22/2019 1538   NITRITE NEGATIVE 07/22/2019 1538   LEUKOCYTESUR SMALL (A) 07/22/2019 1538    Radiological Exams on Admission: DG Chest Port 1 View  Result Date: 07/22/2019 CLINICAL DATA:  Feeling weak EXAM: PORTABLE CHEST 1 VIEW COMPARISON:  February 28, 2019 FINDINGS: The heart size and mediastinal contours are within normal limits. Both lungs are clear. Mild linear scar is identified in the left mid lung. The visualized skeletal structures are unremarkable. IMPRESSION: No active disease. Electronically Signed   By: Sherian Rein M.D.   On: 07/22/2019 14:08    EKG: Independently reviewed. A. Fib with RVR septal injury undetermined age.  Assessment/Plan Active Problems:   Atrial fibrillation with RVR (HCC)   Syncope   Benign hypertension   GERD (gastroesophageal reflux disease)   Hyperlipidemia  (please populate well all problems here in Problem List. (For example, if patient is on BP meds at home and you resume or decide to hold them, it is a problem that needs to be her. Same for CAD, COPD, HLD and so on)   1. A.Fib with RVR - now rate controlled on dilt qtt. Is going from  RR to [facility] but rate is controlled. Duration of a. Fib unknown. CHADsVasc score 6. Last Echo 12/28/18 -  LVF 60-65%, grade I diastolic dysfunction. No evidence of heart failure.  Plan Eliquis 2.5 mg bid  With  good rate control will start PO dilt @ 60 mg tid; D/C qtt 1 hr after oral dose  Close monitoring of BP  2. Syncope - no evidence of seizure activity. Neuro exam is non-focal. CNS event unlikely. Suspect combination of orthostatic hypotension and rapid heart rate lead to syncope. Plan  Management per #1  Rule of "20" with position change  3. HTN - on losartan/hct. Plan Add diltiazem 60 mg tid per #1  Change losartan/hct to low dose losartan - 25 mg daily  Close monitoring x 24 hrs  4. Pancreatitis - continue home meds.  5. Code status - discussed with patient and daughter [name]. Patient very clear about No heroic measures, no CPR, DNI.     DVT prophylaxis: eliquis  Code Status: DNR  Family Communication: Daughter [name] present during interview and exam. Answered all questions. They understand Dx and TX plan  Disposition Plan: home when medically stable  Consults called: cardiology - Dr. Graciela Husbands (with names) Admission status: inpatient/tele   Illene Regulus MD [facility] Pager [phone]  If 7PM-7AM, please contact night-coverage [email or web address] Password [email or web address]  07/22/2019, 7:00 PM

## 2019-07-22 NOTE — Consult Note (Addendum)
Cardiology Consultation:   Patient ID: Danielle Colon MRN: [id number]; DOB: 1930-01-26  Admit date: 07/22/2019 Date of Consult: 07/22/2019  Primary Care Provider: Daisy Floro, MD [facility] Cardiologist: Debbe Odea, MD  [facility] Electrophysiologist:  None   Patient Profile:   Danielle Colon is a 86 y.o. female with a hx of HTN, HLD, Bells palsy, TIA, GERD, pancreatitis who is being seen today for the evaluation of afib at the request of Dr. Chipper Herb.  History of Present Illness:   Ms. Harston has been seen by Dr. Azucena Cecil in the past. She was seen in 12/2018 for chest pain. She had a recent ER visit where work-up was essentially normal. Echo from 2015 showed normal EF. Echo and Myoview were ordered, which have not been performed yet.   The patient presented to ER at [facility] 07/22/19 for Loc/syncopal event. She stood up from her bed today and the next thing she remember is she was on the floor. Said everything went black. Her daughter heard her fall and was in her bedroom in less than a minute. Daughter says patient was laying on the floor. Patient was not pale or diaphoretic. Patient says she felt generally fatigued this morning. She denied lightheadedness, dizziness, chest pain, sob. She says she occasionally has dizziness upon standing, but not normally.   In the ED BP 110/68, pulse 117, afebrile, 98% O2, RR 23. Labs showed sodium 133, chloride, Scr 1.29, RSH 4.5, Mag 1.4. EKG showed afib RVR. She was started on IV dilt and given IVF for mild dehydration. Heart rates improved to the 80-90s.   Past Medical History:  Diagnosis Date  . Bell's palsy 2009;    on the right  . Brain TIA    pt denies this hx on 07/18/2013; "they thought it was a stroke but dr said later that it was Bell's Palsy"  . Cerebrovascular disease   . GERD (gastroesophageal reflux disease)   . Hepatitis    "don't remember what kind; had it ~ 3 times when I was younger; last time was in the 1990's"  .  Hyperlipidemia   . Hypertension   . Pancreatitis     Past Surgical History:  Procedure Laterality Date  . APPENDECTOMY  ~ 1971  . CATARACT EXTRACTION Right   . CHOLECYSTECTOMY  ~ 1971  . TONSILLECTOMY  1949     Home Medications:  Prior to Admission medications   Medication Sig Start Date End Date Taking? Authorizing Provider  acetaminophen (TYLENOL) 325 MG tablet Take 2 tablets (650 mg total) by mouth every 6 (six) hours as needed for mild pain (or Fever >/= 101). 03/05/19   Myrlene Broker, MD  ascorbic acid (VITAMIN C) 500 MG tablet Take 500 mg by mouth daily.    [provider]  aspirin EC 81 MG tablet Take 81 mg by mouth daily.    [provider]  cholecalciferol (VITAMIN D3) 25 MCG (1000 UNIT) tablet Take 1,000 Units by mouth daily.    [provider]  lipase/protease/amylase 24000-76000 units CPEP Take 1 capsule (24,000 Units total) by mouth 3 (three) times daily before meals. 03/05/19   Myrlene Broker, MD  losartan-hydrochlorothiazide (HYZAAR) 100-25 MG tablet Take 1 tablet by mouth daily. 10/22/18   [provider]  nitroGLYCERIN (NITROSTAT) 0.4 MG SL tablet Place 1 tablet (0.4 mg total) under the tongue every 5 (five) minutes as needed for chest pain. Patient not taking: Reported on 02/28/2019 07/19/13   Rai, Delene Ruffini,  MD  pantoprazole (PROTONIX) 40 MG tablet Take 1 tablet (40 mg total) by mouth 2 (two) times daily before a meal. Patient taking differently: Take 40 mg by mouth daily.  07/19/13   Rai, Ripudeep K, MD  PROLENSA 0.07 % SOLN Place 1 drop into both eyes daily. 02/12/19   [provider]    Inpatient Medications: Scheduled Meds: . sodium chloride flush  3 mL Intravenous Once   Continuous Infusions: . diltiazem (CARDIZEM) infusion 5 mg/hr (07/22/19 1402)   PRN Meds:   Allergies:    Allergies  Allergen Reactions  . Tessalon Perles [Benzonatate] Other (See Comments)  . Neurontin [Gabapentin] Other (See  Comments)  . Zantac [Ranitidine Hcl] Other (See Comments)    GI upset    Social History:   Social History   Socioeconomic History  . Marital status: Single    Spouse name: Not on file  . Number of children: 4  . Years of education: college-2  . Highest education level: Not on file  Occupational History  . Occupation: retired  . Occupation: Product/process development scientist  Tobacco Use  . Smoking status: Never Smoker  . Smokeless tobacco: Never Used  Vaping Use  . Vaping Use: Never used  Substance and Sexual Activity  . Alcohol use: No  . Drug use: No  . Sexual activity: Not on file  Other Topics Concern  . Not on file  Social History Narrative  . Not on file   Social Determinants of Health   Financial Resource Strain:   . Difficulty of Paying Living Expenses:   Food Insecurity:   . Worried About Programme researcher, broadcasting/film/video in the Last Year:   . Barista in the Last Year:   Transportation Needs:   . Freight forwarder (Medical):   Marland Kitchen Lack of Transportation (Non-Medical):   Physical Activity:   . Days of Exercise per Week:   . Minutes of Exercise per Session:   Stress:   . Feeling of Stress :   Social Connections:   . Frequency of Communication with Friends and Family:   . Frequency of Social Gatherings with Friends and Family:   . Attends Religious Services:   . Active Member of Clubs or Organizations:   . Attends Banker Meetings:   Marland Kitchen Marital Status:   Intimate Partner Violence:   . Fear of Current or Ex-Partner:   . Emotionally Abused:   Marland Kitchen Physically Abused:   . Sexually Abused:     Family History:   Family History  Problem Relation Age of Onset  . Pneumonia Mother        old age  . Heart attack Father   . Diabetes Father   . Diabetes Sister   . Diabetes Brother   . Cancer Sister   . Diabetes Sister   . Diabetes Sister      ROS:  Please see the history of present illness.  All other ROS reviewed and negative.     Physical Exam/Data:    Vitals:   07/22/19 1652 07/22/19 1700 07/22/19 1823 07/22/19 1832  BP: 100/70 100/64    Pulse: 94 76 81   Resp: 18 12 16    Temp:   98.9 F (37.2 C)   TempSrc:   Oral   SpO2: 100% 100% 100%   Weight:    69.3 kg  Height:    5\' 2"  (1.575 m)    Intake/Output Summary (Last 24 hours) at 07/22/2019 1850 Last data  filed at 07/22/2019 1448 Gross per 24 hour  Intake 503.63 ml  Output --  Net 503.63 ml   Last 3 Weights 07/22/2019 07/22/2019 02/28/2019  Weight (lbs) 152 lb 11.2 oz 154 lb 5.2 oz 182 lb 15.7 oz  Weight (kg) 69.264 kg 70 kg 83 kg     Body mass index is 27.93 kg/m.  General:  Well nourished, well developed, in no acute distress HEENT: normal Lymph: no adenopathy Neck: no JVD Endocrine:  No thryomegaly Vascular: No carotid bruits; FA pulses 2+ bilaterally without bruits  Cardiac:  normal S1, S2; RRR; no murmur  Lungs:  clear to auscultation bilaterally, no wheezing, rhonchi or rales  Abd: soft, nontender, no hepatomegaly  Ext: no edema Musculoskeletal:  No deformities, BUE and BLE strength normal and equal Skin: warm and dry  Neuro:  CNs 2-12 intact, no focal abnormalities noted Psych:  Normal affect   EKG:  The EKG was personally reviewed and demonstrates:  Afib RVR, 124 bpm. LAD, nonspecific ST/T wave changes Telemetry:  Telemetry was personally reviewed and demonstrates:  Afib HR 80-90s, now in sinus with PACs with heart rates in the 60s.   Relevant CV Studies: Echo 07/2013 Study Conclusions   - Left ventricle: The cavity size was normal. Wall thickness was  normal. Systolic function was normal. The estimated ejection  fraction was in the range of 60% to 65%. Wall motion was normal;  there were no regional wall motion abnormalities. Doppler  parameters are consistent with abnormal left ventricular  relaxation (grade 1 diastolic dysfunction). Doppler parameters  are consistent with high ventricular filling pressure.  - Mitral valve: Calcified annulus.   - Left atrium: The atrium was moderately dilated.    Laboratory Data:  High Sensitivity Troponin:  No results for input(s): TROPONINIHS in the last 720 hours.   Chemistry Recent Labs  Lab 07/22/19 1329  NA 133*  K 3.5  CL 97*  CO2 26  GLUCOSE 120*  BUN 21  CREATININE 1.29*  CALCIUM 9.7  GFRNONAA 37*  GFRAA 43*  ANIONGAP 10    Recent Labs  Lab 07/22/19 1329  PROT 7.3  ALBUMIN 4.0  AST 19  ALT 14  ALKPHOS 72  BILITOT 0.7   Hematology Recent Labs  Lab 07/22/19 1329  WBC 8.7  RBC 4.29  HGB 13.0  HCT 39.6  MCV 92.3  MCH 30.3  MCHC 32.8  RDW 12.9  PLT 190   BNPNo results for input(s): BNP, PROBNP in the last 168 hours.  DDimer No results for input(s): DDIMER in the last 168 hours.   Radiology/Studies:  DG Chest Port 1 View  Result Date: 07/22/2019 CLINICAL DATA:  Feeling weak EXAM: PORTABLE CHEST 1 VIEW COMPARISON:  February 28, 2019 FINDINGS: The heart size and mediastinal contours are within normal limits. Both lungs are clear. Mild linear scar is identified in the left mid lung. The visualized skeletal structures are unremarkable. IMPRESSION: No active disease. Electronically Signed   By: Sherian Rein M.D.   On: 07/22/2019 14:08     Assessment and Plan:   New onset afib - patient admitted for syncopal event found to have new onset afib - started on IV dilt and heart rates improved to 80-90s - CHADSVASC = 6 (HTN, female, agex2, TIA x2) - Patient will need long term anticoagulation, likely with Eliquis . Will discuss with MD. Will get head CT prior to starting given syncope. If non-acute okay to start - Echo in 2015 with preserved EF and  moderately dilated LA. Check echo - Patient converted to sinus with PACs with heart rates in the 60s - can likely transition to po dilt  Syncope - given IVF for mild dehydration - New afib found on admission - check orthostatics - no CT head ordered - further work-up per primary team - can consider heart monitor  on discharge  HTN - PTA losartan-HCTZ 100-25mg  daily - IV dilt started in the ED - pressures somewhat soft>>now improved since in sinus rhythm   HLD - not on meds at baseline  For questions or updates, please contact [facility] Please consult [email or web address] for contact info under    Signed, Cadence David StallH Furth, PA-C  07/22/2019 6:50 PM  Atrial fib paroxysmal  Syncope  Spells-weakness non positional  Hypertension  Orthostatic lightheadedness  Pancreatitis 2/21   Pt has syncope in setting of normal ECG and previously normal LV function and recent history of orthostatic LH and weak spells.  The former features make arrhythmic syncope statistically unlikely; repeat echo is important to confirm  Her afib has been without palpitations so unclear how it may be contributing to her weak spells or aggravating her orthostasis  She has been hypotensive today in the setting of her afib, bdaughter having recorded BP in the 80's-- BP improved following spontaneous reversion to sinus  Will check orthostatic VS  Will need outpt monitoring to assess afib burden  It is reasonable without any way to discern her afib duration to begin antictoagulation but wil get head CT first as she fell today  Her symptoms in her mind date ot pancreatitis and dont know the possible association

## 2019-07-22 NOTE — ED Triage Notes (Signed)
Pt states she was getting out of bed ~8am and blacked out-daughter that lives in home states she heard her "she was out on the floor-she didn't hit her head on anything-out less than a minute"-pt c/o 'feeling weak" since admn for pancreatitis in Feb-NAD-slow gait

## 2019-07-22 NOTE — ED Notes (Signed)
PT aware of the need for UA 

## 2019-07-23 ENCOUNTER — Inpatient Hospital Stay (HOSPITAL_BASED_OUTPATIENT_CLINIC_OR_DEPARTMENT_OTHER): Payer: Medicare Other

## 2019-07-23 ENCOUNTER — Observation Stay (HOSPITAL_BASED_OUTPATIENT_CLINIC_OR_DEPARTMENT_OTHER): Payer: Medicare Other

## 2019-07-23 ENCOUNTER — Other Ambulatory Visit: Payer: Self-pay | Admitting: Cardiology

## 2019-07-23 DIAGNOSIS — R55 Syncope and collapse: Secondary | ICD-10-CM | POA: Diagnosis present

## 2019-07-23 DIAGNOSIS — I4891 Unspecified atrial fibrillation: Secondary | ICD-10-CM

## 2019-07-23 DIAGNOSIS — I10 Essential (primary) hypertension: Secondary | ICD-10-CM | POA: Diagnosis not present

## 2019-07-23 LAB — ECHOCARDIOGRAM COMPLETE
Height: 62 in
Weight: 2433.88 oz

## 2019-07-23 LAB — BASIC METABOLIC PANEL
Anion gap: 9 (ref 5–15)
BUN: 15 mg/dL (ref 8–23)
CO2: 27 mmol/L (ref 22–32)
Calcium: 9.5 mg/dL (ref 8.9–10.3)
Chloride: 103 mmol/L (ref 98–111)
Creatinine, Ser: 1.15 mg/dL — ABNORMAL HIGH (ref 0.44–1.00)
GFR calc Af Amer: 50 mL/min — ABNORMAL LOW (ref >60)
GFR calc non Af Amer: 43 mL/min — ABNORMAL LOW (ref >60)
Glucose, Bld: 110 mg/dL — ABNORMAL HIGH (ref 70–99)
Potassium: 3.7 mmol/L (ref 3.5–5.1)
Sodium: 139 mmol/L (ref 135–145)

## 2019-07-23 MED ORDER — PANTOPRAZOLE SODIUM 40 MG PO TBEC: 40.0000 mg | DELAYED_RELEASE_TABLET | Freq: Every day | ORAL | Status: DC

## 2019-07-23 MED ORDER — DILTIAZEM HCL ER COATED BEADS 120 MG PO CP24
120.0000 mg | ORAL_CAPSULE | Freq: Every day | ORAL | 0 refills | Status: DC
Start: 2019-07-23 — End: 2019-07-23

## 2019-07-23 MED ORDER — DILTIAZEM HCL ER COATED BEADS 120 MG PO CP24
120.0000 mg | ORAL_CAPSULE | Freq: Every day | ORAL | 0 refills | Status: DC
Start: 2019-07-23 — End: 2019-09-27

## 2019-07-23 MED ORDER — OFF THE BEAT BOOK
Freq: Once | Status: DC
  Filled 2019-07-23: qty 1

## 2019-07-23 MED ORDER — APIXABAN 5 MG PO TABS: 5.0000 mg | ORAL_TABLET | Freq: Two times a day (BID) | ORAL | 0 refills | Status: DC

## 2019-07-23 MED ORDER — LOSARTAN POTASSIUM 50 MG PO TABS: 50.0000 mg | ORAL_TABLET | Freq: Every day | ORAL | 0 refills | Status: DC

## 2019-07-23 MED ORDER — APIXABAN 5 MG PO TABS: 5.0000 mg | ORAL_TABLET | Freq: Two times a day (BID) | ORAL | 3 refills | Status: DC

## 2019-07-23 MED FILL — ELIQUIS 5 MG TABLET: 5 | 30 days supply | Qty: 60 | Fill #0

## 2019-07-23 NOTE — Care Management CC44 (Signed)
Condition Code 44 Documentation Completed  Patient Details  Name: Danielle Colon MRN: [id number] Date of Birth: 04/14/1930   Condition Code 44 given:  Yes Patient signature on Condition Code 44 notice:  Yes Documentation of 2 MD's agreement:  Yes Code 44 added to claim:  Yes    Darrold Span, RN 07/23/2019, 2:41 PM

## 2019-07-23 NOTE — Plan of Care (Signed)
  Problem: Education: Goal: Knowledge of General Education information will improve Description: Including pain rating scale, medication(s)/side effects and non-pharmacologic comfort measures Outcome: Completed/Met   Problem: Health Behavior/Discharge Planning: Goal: Ability to manage health-related needs will improve Outcome: Completed/Met   Problem: Clinical Measurements: Goal: Ability to maintain clinical measurements within normal limits will improve Outcome: Completed/Met Goal: Will remain free from infection Outcome: Completed/Met Goal: Diagnostic test results will improve Outcome: Completed/Met Goal: Respiratory complications will improve Outcome: Completed/Met Goal: Cardiovascular complication will be avoided Outcome: Progressing   Problem: Activity: Goal: Risk for activity intolerance will decrease Outcome: Completed/Met   Problem: Nutrition: Goal: Adequate nutrition will be maintained Outcome: Completed/Met   Problem: Coping: Goal: Level of anxiety will decrease Outcome: Completed/Met   Problem: Elimination: Goal: Will not experience complications related to bowel motility Outcome: Progressing   Problem: Elimination: Goal: Will not experience complications related to urinary retention Outcome: Completed/Met   Problem: Pain Managment: Goal: General experience of comfort will improve Outcome: Completed/Met   Problem: Safety: Goal: Ability to remain free from injury will improve Outcome: Completed/Met   Problem: Skin Integrity: Goal: Risk for impaired skin integrity will decrease Outcome: Completed/Met

## 2019-07-23 NOTE — Progress Notes (Signed)
Carotid duplex has been completed.   Preliminary results in CV Proc.   Blanch Media 07/23/2019 3:39 PM

## 2019-07-23 NOTE — Progress Notes (Signed)
Patient ID: Danielle Colon, female   DOB: [birth year], 86 y.o.   MRN: [id number]  PROGRESS NOTE    Ardena Gangl [facility]  [id number]:[id number] DOB: Mar 25, 1930 DOA: 07/22/2019 PCP: Daisy Floro, MD   Brief Narrative:  86 year old female with history of breast body, hyperlipidemia, hypertension, GERD, chronic pancreatitis, TIA presented with syncope and was tachycardic at [facility].  EKG revealed A. fib with RVR for which she was started on IV Cardizem but subsequently switched to oral Cardizem.  Cardiology was consulted.  Assessment & Plan:   Paroxysmal A. Fib -CHA2DS2-VASc of 6 -Initially started on IV Cardizem but subsequently switched to oral Cardizem.  Cardiology following.  She has been started on Eliquis.  Currently rate controlled. -Follow 2D echo  Syncope -Probably cardiogenic; doubt that patient had neurogenic syncope -treated with some mild IV fluids. -Fall precautions.  PT eval.  CT of the head was negative for acute intracranial abnormality. -Might need Holter monitor on discharge  Hypertension -Blood pressure stable.  Continue Cardizem, losartan.  Chronic pancreatitis -Continue pancreatic enzyme supplementation.  GERD  -continue Protonix  DVT prophylaxis: Eliquis Code Status: DNR Family Communication: None at bedside Disposition Plan: Status is: Inpatient  Remains inpatient appropriate because:Inpatient level of care appropriate due to severity of illness   Dispo: The patient is from: Home              Anticipated d/c is to: Home              Anticipated d/c date is: 1 day              Patient currently is not medically stable to d/c.   Consultants: Cardiology  Procedures: Echo pending  Antimicrobials: None   Subjective: Patient seen and examined at bedside.  She feels weak but denies any chest pain, palpitations or worsening shortness of breath.  No overnight fever or vomiting reported.  Objective: Vitals:   07/22/19 2144 07/22/19 2149 07/23/19 0010 07/23/19  0512  BP:  100/62    Pulse: 70 71 72 70  Resp:   18 18  Temp:   97.8 F (36.6 C) 98 F (36.7 C)  TempSrc:   Oral Oral  SpO2: 99% 94% 95% 95%  Weight:    69 kg  Height:        Intake/Output Summary (Last 24 hours) at 07/23/2019 1041 Last data filed at 07/23/2019 0000 Gross per 24 hour  Intake 1011.95 ml  Output --  Net 1011.95 ml   Filed Weights   07/22/19 1303 07/22/19 1832 07/23/19 0512  Weight: 70 kg 69.3 kg 69 kg    Examination:  General exam: Appears calm and comfortable.  Elderly female sitting at the edge of the bed. Respiratory system: Bilateral decreased breath sounds at bases with some scattered crackles Cardiovascular system: S1 & S2 heard, currently rate controlled Gastrointestinal system: Abdomen is nondistended, soft and nontender. Normal bowel sounds heard. Extremities: No cyanosis, clubbing, edema  Central nervous system: Alert and oriented.  Moving extremities.   Skin: No obvious rashes, lesions or ulcers Psychiatry: Flat affect    Data Reviewed: I have personally reviewed following labs and imaging studies  CBC: Recent Labs  Lab 07/22/19 1329  WBC 8.7  HGB 13.0  HCT 39.6  MCV 92.3  PLT 190   Basic Metabolic Panel: Recent Labs  Lab 07/22/19 1329 07/23/19 0312  NA 133* 139  K 3.5 3.7  CL 97* 103  CO2 26 27  GLUCOSE 120* 110*  BUN 21  15  CREATININE 1.29* 1.15*  CALCIUM 9.7 9.5  MG 1.4*  --    GFR: Estimated Creatinine Clearance: 31.4 mL/min (A) (by C-G formula based on SCr of 1.15 mg/dL (H)). Liver Function Tests: Recent Labs  Lab 07/22/19 1329  AST 19  ALT 14  ALKPHOS 72  BILITOT 0.7  PROT 7.3  ALBUMIN 4.0   No results for input(s): LIPASE, AMYLASE in the last 168 hours. No results for input(s): AMMONIA in the last 168 hours. Coagulation Profile: No results for input(s): INR, PROTIME in the last 168 hours. Cardiac Enzymes: No results for input(s): CKTOTAL, CKMB, CKMBINDEX, TROPONINI in the last 168 hours. BNP (last 3  results) No results for input(s): PROBNP in the last 8760 hours. HbA1C: No results for input(s): HGBA1C in the last 72 hours. CBG: Recent Labs  Lab 07/22/19 1331  GLUCAP 115*   Lipid Profile: No results for input(s): CHOL, HDL, LDLCALC, TRIG, CHOLHDL, LDLDIRECT in the last 72 hours. Thyroid Function Tests: Recent Labs    07/22/19 1329  TSH 4.553*   Anemia Panel: No results for input(s): VITAMINB12, FOLATE, FERRITIN, TIBC, IRON, RETICCTPCT in the last 72 hours. Sepsis Labs: No results for input(s): PROCALCITON, LATICACIDVEN in the last 168 hours.  Recent Results (from the past 240 hour(s))  SARS Coronavirus 2 by RT PCR (hospital order, performed in [facility] hospital lab) Nasopharyngeal Nasopharyngeal Swab     Status: None   Collection Time: 07/22/19  1:54 PM   Specimen: Nasopharyngeal Swab  Result Value Ref Range Status   SARS Coronavirus 2 NEGATIVE NEGATIVE Final    Comment: (NOTE) SARS-CoV-2 target nucleic acids are NOT DETECTED.  The SARS-CoV-2 RNA is generally detectable in upper and lower respiratory specimens during the acute phase of infection. The lowest concentration of SARS-CoV-2 viral copies this assay can detect is 250 copies / mL. A negative result does not preclude SARS-CoV-2 infection and should not be used as the sole basis for treatment or other patient management decisions.  A negative result may occur with improper specimen collection / handling, submission of specimen other than nasopharyngeal swab, presence of viral mutation(s) within the areas targeted by this assay, and inadequate number of viral copies (<250 copies / mL). A negative result must be combined with clinical observations, patient history, and epidemiological information.  Fact Sheet for Patients:   BoilerBrush.com.cy  Fact Sheet for Healthcare Providers: https://pope.com/  This test is not yet approved or  cleared by the [place] FDA and has been authorized for detection and/or diagnosis of SARS-CoV-2 by FDA under an Emergency Use Authorization (EUA).  This EUA will remain in effect (meaning this test can be used) for the duration of the COVID-19 declaration under Section 564(b)(1) of the Act, 21 U.S.C. section 360bbb-3(b)(1), unless the authorization is terminated or revoked sooner.  Performed at [facility], [address] [place], [place] [address]          Radiology Studies: CT HEAD WO CONTRAST  Result Date: 07/22/2019 CLINICAL DATA:  Syncope/presyncope, cardiac cause suspected. EXAM: CT HEAD WITHOUT CONTRAST TECHNIQUE: Contiguous axial images were obtained from the base of the skull through the vertex without intravenous contrast. COMPARISON:  Brain MRI 07/28/2013, report from head CT 11/04/2007 (images unavailable), head CT 04/24/2007 FINDINGS: Brain: Stable, mild generalized parenchymal atrophy. Stable mild ill-defined hypoattenuation within the cerebral white matter which is nonspecific, but consistent with chronic small vessel ischemic disease. Redemonstrated prominent perivascular spaces within the anterior limbs of the internal  capsules bilaterally. There is no acute intracranial hemorrhage. No acute demarcated cortical infarct is identified. No extra-axial fluid collection. No evidence of intracranial mass. No midline shift. Vascular: No hyperdense vessel.  Atherosclerotic calcifications Skull: Normal. Negative for fracture or focal lesion. Sinuses/Orbits: Visualized orbits show no acute finding. Mild ethmoid sinus mucosal thickening. No significant mastoid effusion. IMPRESSION: No CT evidence of acute intracranial abnormality. Stable, mild generalized parenchymal atrophy and chronic small vessel ischemic disease. Mild ethmoid sinus mucosal thickening. Electronically Signed   By: Jackey Loge DO   On: 07/22/2019 22:25   DG Chest Port 1 View  Result Date: 07/22/2019 CLINICAL DATA:  Feeling  weak EXAM: PORTABLE CHEST 1 VIEW COMPARISON:  February 28, 2019 FINDINGS: The heart size and mediastinal contours are within normal limits. Both lungs are clear. Mild linear scar is identified in the left mid lung. The visualized skeletal structures are unremarkable. IMPRESSION: No active disease. Electronically Signed   By: Sherian Rein M.D.   On: 07/22/2019 14:08        Scheduled Meds: . apixaban  5 mg Oral BID  . diltiazem  60 mg Oral TID  . lipase/protease/amylase  24,000 Units Oral TID AC  . losartan  50 mg Oral Daily  . pantoprazole  40 mg Oral Daily  . sodium chloride flush  3 mL Intravenous Once   Continuous Infusions: . diltiazem (CARDIZEM) infusion Stopped (07/22/19 2244)          Glade Lloyd, MD [facility] 07/23/2019, 10:41 AM

## 2019-07-23 NOTE — Progress Notes (Signed)
Progress Note  Patient Name: Danielle Colon Date of Encounter: 07/23/2019  [facility] Cardiologist: Debbe Odea, MD   Subjective   Feels well. Eager to go home. Back in NSR since about 1900h. Echo shows normal LVEF, mildly dilated LA, mild-mod MR (my bedside review).  Inpatient Medications    Scheduled Meds: . apixaban  5 mg Oral BID  . diltiazem  60 mg Oral TID  . lipase/protease/amylase  24,000 Units Oral TID AC  . losartan  50 mg Oral Daily  . pantoprazole  40 mg Oral Daily  . sodium chloride flush  3 mL Intravenous Once   Continuous Infusions: . diltiazem (CARDIZEM) infusion Stopped (07/22/19 2244)   PRN Meds: acetaminophen, ondansetron (ZOFRAN) IV   Vital Signs    Vitals:   07/22/19 2144 07/22/19 2149 07/23/19 0010 07/23/19 0512  BP:  100/62    Pulse: 70 71 72 70  Resp:   18 18  Temp:   97.8 F (36.6 C) 98 F (36.7 C)  TempSrc:   Oral Oral  SpO2: 99% 94% 95% 95%  Weight:    69 kg  Height:        Intake/Output Summary (Last 24 hours) at 07/23/2019 1123 Last data filed at 07/23/2019 0000 Gross per 24 hour  Intake 1011.95 ml  Output --  Net 1011.95 ml   Last 3 Weights 07/23/2019 07/22/2019 07/22/2019  Weight (lbs) 152 lb 1.9 oz 152 lb 11.2 oz 154 lb 5.2 oz  Weight (kg) 69 kg 69.264 kg 70 kg      Telemetry    NSR since 1900h - Personally Reviewed  ECG    AFib w RVR; next tracing SR w PACs and LAD ("almost" LAFB). - Personally Reviewed  Physical Exam  Looks yonger than age GEN: No acute distress.   Neck: No JVD Cardiac: RRR, no murmurs, rubs, or gallops.  Respiratory: Clear to auscultation bilaterally. GI: Soft, nontender, non-distended  MS: No edema; No deformity. Neuro:  Nonfocal  Psych: Normal affect   Labs    High Sensitivity Troponin:  No results for input(s): TROPONINIHS in the last 720 hours.    Chemistry Recent Labs  Lab 07/22/19 1329 07/23/19 0312  NA 133* 139  K 3.5 3.7  CL 97* 103  CO2 26 27  GLUCOSE 120* 110*   BUN 21 15  CREATININE 1.29* 1.15*  CALCIUM 9.7 9.5  PROT 7.3  --   ALBUMIN 4.0  --   AST 19  --   ALT 14  --   ALKPHOS 72  --   BILITOT 0.7  --   GFRNONAA 37* 43*  GFRAA 43* 50*  ANIONGAP 10 9     Hematology Recent Labs  Lab 07/22/19 1329  WBC 8.7  RBC 4.29  HGB 13.0  HCT 39.6  MCV 92.3  MCH 30.3  MCHC 32.8  RDW 12.9  PLT 190    BNPNo results for input(s): BNP, PROBNP in the last 168 hours.   DDimer No results for input(s): DDIMER in the last 168 hours.   Radiology    CT HEAD WO CONTRAST  Result Date: 07/22/2019 CLINICAL DATA:  Syncope/presyncope, cardiac cause suspected. EXAM: CT HEAD WITHOUT CONTRAST TECHNIQUE: Contiguous axial images were obtained from the base of the skull through the vertex without intravenous contrast. COMPARISON:  Brain MRI 07/28/2013, report from head CT 11/04/2007 (images unavailable), head CT 04/24/2007 FINDINGS: Brain: Stable, mild generalized parenchymal atrophy. Stable mild ill-defined hypoattenuation within the cerebral white matter which is  nonspecific, but consistent with chronic small vessel ischemic disease. Redemonstrated prominent perivascular spaces within the anterior limbs of the internal capsules bilaterally. There is no acute intracranial hemorrhage. No acute demarcated cortical infarct is identified. No extra-axial fluid collection. No evidence of intracranial mass. No midline shift. Vascular: No hyperdense vessel.  Atherosclerotic calcifications Skull: Normal. Negative for fracture or focal lesion. Sinuses/Orbits: Visualized orbits show no acute finding. Mild ethmoid sinus mucosal thickening. No significant mastoid effusion. IMPRESSION: No CT evidence of acute intracranial abnormality. Stable, mild generalized parenchymal atrophy and chronic small vessel ischemic disease. Mild ethmoid sinus mucosal thickening. Electronically Signed   By: Jackey Loge DO   On: 07/22/2019 22:25   DG Chest Port 1 View  Result Date: 07/22/2019 CLINICAL  DATA:  Feeling weak EXAM: PORTABLE CHEST 1 VIEW COMPARISON:  February 28, 2019 FINDINGS: The heart size and mediastinal contours are within normal limits. Both lungs are clear. Mild linear scar is identified in the left mid lung. The visualized skeletal structures are unremarkable. IMPRESSION: No active disease. Electronically Signed   By: Sherian Rein M.D.   On: 07/22/2019 14:08    Cardiac Studies   Echo shows normal LVEF, mildly dilated LA, mild-mod MR (my bedside review).  Patient Profile     86 y.o. female with syncope and newly diagnosed AFib.  Assessment & Plan    Syncope occurred after getting out of bed and "taking two steps", more c/w orthostatic hypotension. AFib is asymptomatic. Feels same in NSR. No post-conversion pause. Will DC her diuretic. Started diltiazem for mild RVR. May need to increase ARB if BP is high at f/u visit. OK to DC from CV standpoint.  [facility] will sign off.   Medication Recommendations:  diltiazem CD 120 mg daily, losartan 50 mg daily, apixaban 5 mg twice daily. Stop losartan-HCTZ.  Other recommendations (labs, testing, etc):  30 day event monitor (will schedule) Follow up as an outpatient:  she prefers f/u in [place] office. Will schedule w Dr. Graciela Husbands after completion of event monitor.  For questions or updates, please contact [facility] Please consult [email or web address] for contact info under        Signed, Thurmon Fair, MD  07/23/2019, 11:23 AM

## 2019-07-23 NOTE — TOC Benefit Eligibility Note (Signed)
Transition of Care [facility]) Benefit Eligibility Note    Patient Details  Name: Danielle Colon MRN: [id number] Date of Birth: 1930/10/26   Medication/Dose: Eliquis 5mg  bid  Covered?: Yes     Prescription Coverage Preferred Pharmacy: most major retail pharmacies  Spoke with Person/Company/Phone Number:: [employer]  Co-Pay: $203.81 for 90 day mail order/ $47.00 for 30 day retail (may be more due to deductible)  Prior Approval: No  Deductible: Unmet ($50 deductilble remaining.for retail)       [phone] Phone Number: 07/23/2019, 2:44 PM

## 2019-07-23 NOTE — Progress Notes (Signed)
  Echocardiogram 2D Echocardiogram has been performed.  Danielle Colon 07/23/2019, 11:14 AM

## 2019-07-23 NOTE — Evaluation (Signed)
Physical Therapy Evaluation Patient Details Name: Danielle Colon MRN: [id number] DOB: 06/12/1930 Today's Date: 07/23/2019   History of Present Illness  86 y.o. female with medical history significant of Bell's Palsy, HLD, HTN, GERD, pancreatitis, TIA. She was last seen by cardiology 12/1118 in f/u for chest pain and SOB. Echo 7/2/120 with EF 60-65%, grade I diastolic dysfunction. Pt brought to ED after syncopal event. Found to be tachycardic in Afib with RVR. Admitted for observation 07/22/19  Clinical Impression  PTA pt living with daughter in single story home with level entry. Daughter works as a IT consultant so is not home all day. Pt reports independence with ambulation, ADLs and iADLS, driving to family members and beautician. Pt is very disappointed that she wont be able to drive for 6 months. Pt is limited in safe mobility by decrease strength and endurance. Pt is mod I for bed mobility and min A for transfers and min guard for ambulation with RW and min A for ambulation without AD. PT recommends HHPT at discharge to return to PLOF. Pt requests female therapist as she is home alone most of the day. PT will continue to follow acutely.    Follow Up Recommendations Home health PT;Supervision - Intermittent    Equipment Recommendations  None recommended by PT (has RW at home)       Precautions / Restrictions Precautions Precautions: Fall Restrictions Weight Bearing Restrictions: No      Mobility  Bed Mobility Overal bed mobility: Modified Independent             General bed mobility comments: increased time and effort with HoB slightly elevated  Transfers Overall transfer level: Needs assistance Equipment used: Rolling walker (2 wheeled) Transfers: Sit to/from Stand Sit to Stand: Min assist         General transfer comment: min A for power up and steadying, vc for hand placement for powerup, pt reports slight dizziness on standing which dissipates quickly    Ambulation/Gait Ambulation/Gait assistance: Min guard;Min assist Gait Distance (Feet): 80 Feet Assistive device: None;Rolling walker (2 wheeled) Gait Pattern/deviations: Step-through pattern;Decreased stride length;Shuffle;Trunk flexed Gait velocity: slowed Gait velocity interpretation: <1.31 ft/sec, indicative of household ambulator General Gait Details: min guard for ambulation with RW, vc for proximity to RW and upright posture, attempted ambulation without AD pt requires min A for steadying and reports she does not feel steady enough to not use RW        Balance Overall balance assessment: Needs assistance Sitting-balance support: Feet supported;No upper extremity supported Sitting balance-Leahy Scale: Good     Standing balance support: No upper extremity supported;During functional activity Standing balance-Leahy Scale: Fair Standing balance comment: pt able to static stand to place mask without support, dynamic balance requires at least single UE support                             Pertinent Vitals/Pain Pain Assessment: No/denies pain    Home Living Family/patient expects to be discharged to:: Private residence Living Arrangements: Children Available Help at Discharge: Family;Available PRN/intermittently (daughter works during the day) Type of Home: House Home Access: Level entry     Home Layout: One level Home Equipment: Environmental consultant - 2 wheels      Prior Function Level of Independence: Independent         Comments: ambulates without AD, independent in ADLs daughter assists with iADLs        Extremity/Trunk Assessment   Upper  Extremity Assessment Upper Extremity Assessment: Generalized weakness    Lower Extremity Assessment Lower Extremity Assessment: Generalized weakness    Cervical / Trunk Assessment Cervical / Trunk Assessment: Kyphotic  Communication   Communication: No difficulties  Cognition Arousal/Alertness: Awake/alert Behavior  During Therapy: WFL for tasks assessed/performed Overall Cognitive Status: Within Functional Limits for tasks assessed                                        General Comments General comments (skin integrity, edema, etc.): VSS on RA, daughter in room for session         Assessment/Plan    PT Assessment Patient needs continued PT services  PT Problem List Decreased strength;Decreased balance;Decreased mobility;Decreased knowledge of use of DME       PT Treatment Interventions DME instruction;Gait training;Functional mobility training;Therapeutic activities;Therapeutic exercise;Balance training;Cognitive remediation;Patient/family education    PT Goals (Current goals can be found in the Care Plan section)  Acute Rehab PT Goals Patient Stated Goal: go home to puppies PT Goal Formulation: With patient/family Time For Goal Achievement: 08/06/19 Potential to Achieve Goals: Good    Frequency Min 3X/week   Barriers to discharge Decreased caregiver support         AM-PAC PT "6 Clicks" Mobility  Outcome Measure Help needed turning from your back to your side while in a flat bed without using bedrails?: None Help needed moving from lying on your back to sitting on the side of a flat bed without using bedrails?: A Little Help needed moving to and from a bed to a chair (including a wheelchair)?: None Help needed standing up from a chair using your arms (e.g., wheelchair or bedside chair)?: None Help needed to walk in hospital room?: A Little Help needed climbing 3-5 steps with a railing? : A Lot 6 Click Score: 20    End of Session Equipment Utilized During Treatment: Gait belt Activity Tolerance: Patient tolerated treatment well Patient left: in chair;with call bell/phone within reach;with chair alarm set Nurse Communication: Mobility status PT Visit Diagnosis: Unsteadiness on feet (R26.81);Other abnormalities of gait and mobility (R26.89);Muscle weakness  (generalized) (M62.81);Difficulty in walking, not elsewhere classified (R26.2)    Time: 6644-0347 PT Time Calculation (min) (ACUTE ONLY): 32 min   Charges:   PT Evaluation $PT Eval Moderate Complexity: 1 Mod PT Treatments $Gait Training: 8-22 mins        Xabi Wittler B. Beverely Risen PT, DPT Acute Rehabilitation Services Pager [phone] Office [phone]   Elon Alas Fleet 07/23/2019, 4:27 PM

## 2019-07-23 NOTE — Discharge Instructions (Signed)

## 2019-07-23 NOTE — Discharge Summary (Signed)
Physician Discharge Summary  Danielle Colon [facility] RUE:[id number] DOB: [birth year] DOA: 07/22/2019  PCP: Daisy Floro, MD  Admit date: 07/22/2019 Discharge date: 07/23/2019  Admitted From: Home Disposition: Home  Recommendations for Outpatient Follow-up:  1. Follow up with PCP in 1 week with repeat CBC/BMP 2. Outpatient follow-up with cardiology 3. Follow up in ED if symptoms worsen or new appear   Home Health: Home with PT  equipment/Devices: None  Discharge Condition: Stable CODE STATUS: DNR  diet recommendation: Heart healthy  Brief/Interim Summary:  86 year old female with history of breast body, hyperlipidemia, hypertension, GERD, chronic pancreatitis, TIA presented with syncope and was tachycardic at [facility]. EKG revealed A. fib with RVR for which she was started on IV Cardizem but subsequently switched to oral Cardizem. Cardiology was consulted.  Subsequently, her heart rate has remained stable.  2D echo and carotid ultrasound have been performed.  Cardiology has cleared the patient for discharge on oral Eliquis and Cardizem with outpatient follow-up with cardiology.  She will be discharged home with home health PT.  Discharge Diagnoses:   Paroxysmal A. Fib  -CHA2DS2-VASc of 6  -Initially started on IV Cardizem but subsequently switched to oral Cardizem. Cardiology following. She has been started on Eliquis. Currently rate controlled.  -2D echo showed EF of 60 to 65%.  Carotid ultrasound was negative for significant carotid stenosis. -Cardiology has cleared the patient for discharge on oral Eliquis and Cardizem.  Will discontinue aspirin.  Discharge home today with outpatient follow-up with cardiology.  Syncope  -Probably cardiogenic; doubt that patient had neurogenic syncope  -treated with some mild IV fluids.  - PT recommended home with PT.  CT of the head was negative for acute intracranial abnormality.  -Cardiology will arrange for outpatient Holter monitor on discharge    Hypertension  -Blood pressure stable. Continue Cardizem, losartan.  DC hydrochlorothiazide on discharge.  Chronic pancreatitis  -Continue pancreatic enzyme supplementation.   GERD  -continue Protonix   Discharge Instructions  Discharge Instructions    Amb referral to [facility] Clinic   Complete by: As directed    Amb referral to AFIB Clinic   Complete by: As directed    Ambulatory referral to Cardiology   Complete by: As directed    Diet - low sodium heart healthy   Complete by: As directed    Increase activity slowly   Complete by: As directed      Allergies as of 07/23/2019      Reactions   Tessalon Perles [benzonatate] Other (See Comments)   Neurontin [gabapentin] Other (See Comments)   Zantac [ranitidine Hcl] Other (See Comments)   GI upset      Medication List    STOP taking these medications   aspirin EC 81 MG tablet   losartan-hydrochlorothiazide 100-25 MG tablet Commonly known as: HYZAAR     TAKE these medications   acetaminophen 325 MG tablet Commonly known as: TYLENOL Take 2 tablets (650 mg total) by mouth every 6 (six) hours as needed for mild pain (or Fever >/= 101).   apixaban 5 MG Tabs tablet Commonly known as: ELIQUIS Take 1 tablet (5 mg total) by mouth 2 (two) times daily.   ascorbic acid 500 MG tablet Commonly known as: VITAMIN C Take 500 mg by mouth daily.   cholecalciferol 25 MCG (1000 UNIT) tablet Commonly known as: VITAMIN D3 Take 1,000 Units by mouth daily.   diltiazem 120 MG 24 hr capsule Commonly known as: Cardizem CD Take 1 capsule (120 mg total) by mouth  daily.   losartan 50 MG tablet Commonly known as: COZAAR Take 1 tablet (50 mg total) by mouth daily. Start taking on: July 24, 2019   Pancrelipase (Lip-Prot-Amyl) 24000-76000 units Cpep Take 1 capsule (24,000 Units total) by mouth 3 (three) times daily before meals.   pantoprazole 40 MG tablet Commonly known as: PROTONIX Take 1 tablet (40 mg total) by mouth daily.        Follow-up Information    Duke Salvia, MD Follow up on 09/03/2019.   Specialty: Cardiology Why: 2:30pm Contact information: [address] [place] [address] [phone]        Daisy Floro, MD. Schedule an appointment as soon as possible for a visit in 1 week(s).   Specialty: Family Medicine Contact information: [address] [place] [address] [phone]        Debbe Odea, MD .   Specialties: Cardiology, Radiology Contact information: [address] [place] [address] [phone]              Allergies  Allergen Reactions  . Tessalon Perles [Benzonatate] Other (See Comments)  . Neurontin [Gabapentin] Other (See Comments)  . Zantac [Ranitidine Hcl] Other (See Comments)    GI upset    Consultations:  Cardiology   Procedures/Studies: CT HEAD WO CONTRAST  Result Date: 07/22/2019 CLINICAL DATA:  Syncope/presyncope, cardiac cause suspected. EXAM: CT HEAD WITHOUT CONTRAST TECHNIQUE: Contiguous axial images were obtained from the base of the skull through the vertex without intravenous contrast. COMPARISON:  Brain MRI 07/28/2013, report from head CT 11/04/2007 (images unavailable), head CT 04/24/2007 FINDINGS: Brain: Stable, mild generalized parenchymal atrophy. Stable mild ill-defined hypoattenuation within the cerebral white matter which is nonspecific, but consistent with chronic small vessel ischemic disease. Redemonstrated prominent perivascular spaces within the anterior limbs of the internal capsules bilaterally. There is no acute intracranial hemorrhage. No acute demarcated cortical infarct is identified. No extra-axial fluid collection. No evidence of intracranial mass. No midline shift. Vascular: No hyperdense vessel.  Atherosclerotic calcifications Skull: Normal. Negative for fracture or focal lesion. Sinuses/Orbits: Visualized orbits show no acute finding. Mild ethmoid sinus mucosal thickening. No  significant mastoid effusion. IMPRESSION: No CT evidence of acute intracranial abnormality. Stable, mild generalized parenchymal atrophy and chronic small vessel ischemic disease. Mild ethmoid sinus mucosal thickening. Electronically Signed   By: Jackey Loge DO   On: 07/22/2019 22:25   DG Chest Port 1 View  Result Date: 07/22/2019 CLINICAL DATA:  Feeling weak EXAM: PORTABLE CHEST 1 VIEW COMPARISON:  February 28, 2019 FINDINGS: The heart size and mediastinal contours are within normal limits. Both lungs are clear. Mild linear scar is identified in the left mid lung. The visualized skeletal structures are unremarkable. IMPRESSION: No active disease. Electronically Signed   By: Sherian Rein M.D.   On: 07/22/2019 14:08   ECHOCARDIOGRAM COMPLETE  Result Date: 07/23/2019    ECHOCARDIOGRAM REPORT   Patient Name:   Danielle Colon Date of Exam: 07/23/2019 Medical Rec #:  [id number]        Height:       62.0 in Accession #:    [id number]       Weight:       152.1 lb Date of Birth:  May 10, 1930         BSA:          1.702 m Patient Age:    86 years         BP:  115/94 mmHg Patient Gender: F                HR:           70 bpm. Exam Location:  Inpatient Procedure: 2D Echo and Strain Analysis Indications:    Atrial Fibrillation 427.31 / I48.91  History:        Patient has prior history of Echocardiogram examinations, most                 recent 07/19/2013. TIA; Risk Factors:Hypertension and                 Dyslipidemia. Bell's Palsy, pancreatitis. Chest pain and SOB                 back in December, other testing ordered but wasn't completed.  Sonographer:    Leta Jungling RDCS Referring Phys: [id number] CADENCE H FURTH IMPRESSIONS  1. Normal LV function; restrictive filling; moderate LAE; mild MR.  2. Left ventricular ejection fraction, by estimation, is 60 to 65%. The left ventricle has normal function. The left ventricle has no regional wall motion abnormalities. Left ventricular diastolic parameters are consistent  with Grade III diastolic dysfunction (restrictive). Elevated left atrial pressure.  3. Right ventricular systolic function is normal. The right ventricular size is normal. Tricuspid regurgitation signal is inadequate for assessing PA pressure.  4. Left atrial size was moderately dilated.  5. The mitral valve is normal in structure. Mild mitral valve regurgitation. No evidence of mitral stenosis.  6. The aortic valve is tricuspid. Aortic valve regurgitation is not visualized. No aortic stenosis is present.  7. The inferior vena cava is normal in size with greater than 50% respiratory variability, suggesting right atrial pressure of 3 mmHg. FINDINGS  Left Ventricle: Left ventricular ejection fraction, by estimation, is 60 to 65%. The left ventricle has normal function. The left ventricle has no regional wall motion abnormalities. The left ventricular internal cavity size was normal in size. There is  no left ventricular hypertrophy. Left ventricular diastolic parameters are consistent with Grade III diastolic dysfunction (restrictive). Elevated left atrial pressure. Right Ventricle: The right ventricular size is normal. Right ventricular systolic function is normal. Tricuspid regurgitation signal is inadequate for assessing PA pressure. The tricuspid regurgitant velocity is 2.17 m/s, and with an assumed right atrial  pressure of 15 mmHg, the estimated right ventricular systolic pressure is 33.8 mmHg. Left Atrium: Left atrial size was moderately dilated. Right Atrium: Right atrial size was normal in size. Pericardium: There is no evidence of pericardial effusion. Mitral Valve: The mitral valve is normal in structure. Normal mobility of the mitral valve leaflets. Mild mitral annular calcification. Mild mitral valve regurgitation. No evidence of mitral valve stenosis. Tricuspid Valve: The tricuspid valve is normal in structure. Tricuspid valve regurgitation is trivial. No evidence of tricuspid stenosis. Aortic Valve: The  aortic valve is tricuspid. Aortic valve regurgitation is not visualized. No aortic stenosis is present. Pulmonic Valve: The pulmonic valve was normal in structure. Pulmonic valve regurgitation is not visualized. No evidence of pulmonic stenosis. Aorta: The aortic root is normal in size and structure. Venous: The inferior vena cava is normal in size with greater than 50% respiratory variability, suggesting right atrial pressure of 3 mmHg. IAS/Shunts: No atrial level shunt detected by color flow Doppler. Additional Comments: Normal LV function; restrictive filling; moderate LAE; mild MR.  LEFT VENTRICLE PLAX 2D LVIDd:         4.00 cm  Diastology LVIDs:  2.35 cm  LV e' lateral:   5.84 cm/s LV PW:         0.90 cm  LV E/e' lateral: 23.6 LV IVS:        1.10 cm  LV e' medial:    6.24 cm/s LVOT diam:     1.60 cm  LV E/e' medial:  22.1 LV SV:         41 LV SV Index:   24 LVOT Area:     2.01 cm  RIGHT VENTRICLE RV S prime:     10.90 cm/s TAPSE (M-mode): 2.3 cm LEFT ATRIUM             Index       RIGHT ATRIUM          Index LA diam:        3.70 cm 2.17 cm/m  RA Area:     9.66 cm LA Vol (A2C):   62.9 ml 36.96 ml/m RA Volume:   20.20 ml 11.87 ml/m LA Vol (A4C):   73.2 ml 43.01 ml/m LA Biplane Vol: 67.8 ml 39.84 ml/m  AORTIC VALVE LVOT Vmax:   109.00 cm/s LVOT Vmean:  65.100 cm/s LVOT VTI:    0.203 m  AORTA Ao Root diam: 2.70 cm MITRAL VALVE                TRICUSPID VALVE MV Area (PHT): 4.15 cm     TV Peak grad:   18.0 mmHg MV Decel Time: 183 msec     TV Vmax:        2.12 m/s MV E velocity: 138.00 cm/s  TR Peak grad:   18.8 mmHg MV A velocity: 52.60 cm/s   TR Vmax:        217.00 cm/s MV E/A ratio:  2.62                             SHUNTS                             Systemic VTI:  0.20 m                             Systemic Diam: 1.60 cm Olga Millers MD Electronically signed by Olga Millers MD Signature Date/Time: 07/23/2019/1:23:54 PM    Final    VAS US CAROTID  Result Date: 07/23/2019 Carotid Arterial Duplex  Study Indications:       Syncope. Risk Factors:      Hypertension, hyperlipidemia. Comparison Study:  no prior Performing Technologist: Blanch Media RVS  Examination Guidelines: A complete evaluation includes B-mode imaging, spectral Doppler, color Doppler, and power Doppler as needed of all accessible portions of each vessel. Bilateral testing is considered an integral part of a complete examination. Limited examinations for reoccurring indications may be performed as noted.  Right Carotid Findings: +----------+--------+--------+--------+------------------+--------+           PSV cm/sEDV cm/sStenosisPlaque DescriptionComments +----------+--------+--------+--------+------------------+--------+ CCA Prox  73      15              heterogenous               +----------+--------+--------+--------+------------------+--------+ CCA Distal49      14              heterogenous               +----------+--------+--------+--------+------------------+--------+  ICA Prox  56      13      1-39%   heterogenous               +----------+--------+--------+--------+------------------+--------+ ICA Distal61      16                                         +----------+--------+--------+--------+------------------+--------+ ECA       64      4                                          +----------+--------+--------+--------+------------------+--------+ +----------+--------+-------+--------+-------------------+           PSV cm/sEDV cmsDescribeArm Pressure (mmHG) +----------+--------+-------+--------+-------------------+ [id number]                                         +----------+--------+-------+--------+-------------------+ +---------+--------+--+--------+--+---------+ VertebralPSV cm/s72EDV cm/s17Antegrade +---------+--------+--+--------+--+---------+  Left Carotid Findings: +----------+--------+--------+--------+------------------+--------+           PSV cm/sEDV  cm/sStenosisPlaque DescriptionComments +----------+--------+--------+--------+------------------+--------+ CCA Prox  49      14              heterogenous               +----------+--------+--------+--------+------------------+--------+ CCA Distal67      13              heterogenous               +----------+--------+--------+--------+------------------+--------+ ICA Prox  46      15      1-39%   heterogenous               +----------+--------+--------+--------+------------------+--------+ ICA Distal43      15                                         +----------+--------+--------+--------+------------------+--------+ ECA       47      5                                          +----------+--------+--------+--------+------------------+--------+ +----------+--------+--------+--------+-------------------+           PSV cm/sEDV cm/sDescribeArm Pressure (mmHG) +----------+--------+--------+--------+-------------------+ Subclavian70                                          +----------+--------+--------+--------+-------------------+ +---------+--------+--+--------+-+---------+ VertebralPSV cm/s39EDV cm/s8Antegrade +---------+--------+--+--------+-+---------+   Summary: Right Carotid: Velocities in the right ICA are consistent with a 1-39% stenosis. Left Carotid: Velocities in the left ICA are consistent with a 1-39% stenosis. Vertebrals: Bilateral vertebral arteries demonstrate antegrade flow. *See table(s) above for measurements and observations.     Preliminary        Subjective: Patient seen and examined at bedside. She feels weak but denies any chest pain, palpitations or worsening shortness of breath. No overnight fever or vomiting reported.   Discharge Exam: Vitals:   07/23/19 0512 07/23/19 0818  BP:  Pulse: 70 70  Resp: 18   Temp: 98 F (36.7 C)   SpO2: 95% 99%    General exam: Appears calm and comfortable. Elderly female sitting at the  edge of the bed.  Respiratory system: Bilateral decreased breath sounds at bases with some scattered crackles  Cardiovascular system: S1 & S2 heard, currently rate controlled  Gastrointestinal system: Abdomen is nondistended, soft and nontender. Normal bowel sounds heard.  Extremities: No cyanosis, clubbing, edema      The results of significant diagnostics from this hospitalization (including imaging, microbiology, ancillary and laboratory) are listed below for reference.     Microbiology: Recent Results (from the past 240 hour(s))  SARS Coronavirus 2 by RT PCR (hospital order, performed [facility]) Nasopharyngeal Nasopharyngeal Swab     Status: None   Collection Time: 07/22/19  1:54 PM   Specimen: Nasopharyngeal Swab  Result Value Ref Range Status   SARS Coronavirus 2 NEGATIVE NEGATIVE Final    Comment: (NOTE) SARS-CoV-2 target nucleic acids are NOT DETECTED.  The SARS-CoV-2 RNA is generally detectable in upper and lower respiratory specimens during the acute phase of infection. The lowest concentration of SARS-CoV-2 viral copies this assay can detect is 250 copies / mL. A negative result does not preclude SARS-CoV-2 infection and should not be used as the sole basis for treatment or other patient management decisions.  A negative result may occur with improper specimen collection / handling, submission of specimen other than nasopharyngeal swab, presence of viral mutation(s) within the areas targeted by this assay, and inadequate number of viral copies (<250 copies / mL). A negative result must be combined with clinical observations, patient history, and epidemiological information.  Fact Sheet for Patients:   BoilerBrush.com.cyhttps://www.fda.gov/media/136312/download  Fact Sheet for Healthcare Providers: https://pope.com/https://www.fda.gov/media/136313/download  This test is not yet approved or  cleared by the [place] FDA and has been authorized for detection and/or diagnosis of  SARS-CoV-2 by FDA under an Emergency Use Authorization (EUA).  This EUA will remain in effect (meaning this test can be used) for the duration of the COVID-19 declaration under Section 564(b)(1) of the Act, 21 U.S.C. section 360bbb-3(b)(1), unless the authorization is terminated or revoked sooner.  Performed at [facility] [place], [address] [place], [place] [id number]      Labs: BNP (last 3 results) Recent Labs    12/27/18 1647  BNP 96.0   Basic Metabolic Panel: Recent Labs  Lab 07/22/19 1329 07/23/19 0312  NA 133* 139  K 3.5 3.7  CL 97* 103  CO2 26 27  GLUCOSE 120* 110*  BUN 21 15  CREATININE 1.29* 1.15*  CALCIUM 9.7 9.5  MG 1.4*  --    Liver Function Tests: Recent Labs  Lab 07/22/19 1329  AST 19  ALT 14  ALKPHOS 72  BILITOT 0.7  PROT 7.3  ALBUMIN 4.0   No results for input(s): LIPASE, AMYLASE in the last 168 hours. No results for input(s): AMMONIA in the last 168 hours. CBC: Recent Labs  Lab 07/22/19 1329  WBC 8.7  HGB 13.0  HCT 39.6  MCV 92.3  PLT 190   Cardiac Enzymes: No results for input(s): CKTOTAL, CKMB, CKMBINDEX, TROPONINI in the last 168 hours. BNP: Invalid input(s): POCBNP CBG: Recent Labs  Lab 07/22/19 1331  GLUCAP 115*   D-Dimer No results for input(s): DDIMER in the last 72 hours. Hgb A1c No results for input(s): HGBA1C in the last 72 hours. Lipid Profile No results for input(s):  CHOL, HDL, LDLCALC, TRIG, CHOLHDL, LDLDIRECT in the last 72 hours. Thyroid function studies Recent Labs    07/22/19 1329  TSH 4.553*   Anemia work up No results for input(s): VITAMINB12, FOLATE, FERRITIN, TIBC, IRON, RETICCTPCT in the last 72 hours. Urinalysis    Component Value Date/Time   COLORURINE STRAW (A) 07/22/2019 1538   APPEARANCEUR CLEAR 07/22/2019 1538   LABSPEC <1.005 (L) 07/22/2019 1538   PHURINE 6.5 07/22/2019 1538   GLUCOSEU NEGATIVE 07/22/2019 1538   HGBUR NEGATIVE 07/22/2019 1538   BILIRUBINUR NEGATIVE  07/22/2019 1538   KETONESUR NEGATIVE 07/22/2019 1538   PROTEINUR NEGATIVE 07/22/2019 1538   NITRITE NEGATIVE 07/22/2019 1538   LEUKOCYTESUR SMALL (A) 07/22/2019 1538   Sepsis Labs Invalid input(s): PROCALCITONIN,  WBC,  LACTICIDVEN Microbiology Recent Results (from the past 240 hour(s))  SARS Coronavirus 2 by RT PCR (hospital order, performed in [facility] hospital lab) Nasopharyngeal Nasopharyngeal Swab     Status: None   Collection Time: 07/22/19  1:54 PM   Specimen: Nasopharyngeal Swab  Result Value Ref Range Status   SARS Coronavirus 2 NEGATIVE NEGATIVE Final    Comment: (NOTE) SARS-CoV-2 target nucleic acids are NOT DETECTED.  The SARS-CoV-2 RNA is generally detectable in upper and lower respiratory specimens during the acute phase of infection. The lowest concentration of SARS-CoV-2 viral copies this assay can detect is 250 copies / mL. A negative result does not preclude SARS-CoV-2 infection and should not be used as the sole basis for treatment or other patient management decisions.  A negative result may occur with improper specimen collection / handling, submission of specimen other than nasopharyngeal swab, presence of viral mutation(s) within the areas targeted by this assay, and inadequate number of viral copies (<250 copies / mL). A negative result must be combined with clinical observations, patient history, and epidemiological information.  Fact Sheet for Patients:   BoilerBrush.com.cy  Fact Sheet for Healthcare Providers: https://pope.com/  This test is not yet approved or  cleared by the [place] FDA and has been authorized for detection and/or diagnosis of SARS-CoV-2 by FDA under an Emergency Use Authorization (EUA).  This EUA will remain in effect (meaning this test can be used) for the duration of the COVID-19 declaration under Section 564(b)(1) of the Act, 21 U.S.C. section 360bbb-3(b)(1), unless the  authorization is terminated or revoked sooner.  Performed at [facility], [address], [place], [place] [address]      Time coordinating discharge: 35 minutes  SIGNED:   Glade Lloyd, MD  [facility] 07/23/2019, 5:30 PM

## 2019-07-23 NOTE — Care Management Obs Status (Signed)
MEDICARE OBSERVATION STATUS NOTIFICATION   Patient Details  Name: Danielle Colon MRN: [id number] Date of Birth: [birth year]   Medicare Observation Status Notification Given:  Yes    Darrold Span, RN 07/23/2019, 2:41 PM

## 2019-07-24 ENCOUNTER — Encounter: Payer: Self-pay | Admitting: *Deleted

## 2019-07-24 NOTE — Care Management (Signed)
07-24-19 1648 Late Entry: Patient transitioned home on 07-23-19. Case Manager spoke with patient early in the day to see if she needed home health services. Patient is agreeable to home health services. Case Manager has worked throughout the day to call agencies; however, has been unsuccessful. Case Manager called [facility], [facility], [facility], [facility], [facility] and [facility]- all unable to accept the patient due to staffing and they are behind with the holiday. Case Manager called patient and daughter [name] to explain the situation. Case Manager was unable to place an outpatient therapy order for this patient because she has been out of the system greater than 2 hours. Patient has an MD appointment on Monday- Case Manager encouraged daughter [name] to ask the primary care provider for home health orders vs outpatient therapy. Daughter also had questions about the event monitor. Case Manager did make the daughter aware that the event monitor will be mailed to the home. Case Manager explained that instructions will be with the event monitor. No further needs from Case Manager at this time. Gala Lewandowsky, RN,BSN

## 2019-07-24 NOTE — Progress Notes (Signed)
Patient ID: Danielle Colon, female   DOB: 1930/10/26, 86 y.o.   MRN: [id number] Patient enrolled for [employer] to ship a 30 day cardiac event monitor to her home.  Letter with instructions mailed to patient and will also be included in the monitor kit.

## 2019-07-31 ENCOUNTER — Ambulatory Visit (INDEPENDENT_AMBULATORY_CARE_PROVIDER_SITE_OTHER): Payer: Medicare Other

## 2019-07-31 ENCOUNTER — Encounter: Payer: Self-pay | Admitting: Internal Medicine

## 2019-07-31 DIAGNOSIS — R55 Syncope and collapse: Secondary | ICD-10-CM | POA: Diagnosis not present

## 2019-08-01 ENCOUNTER — Telehealth: Payer: Self-pay | Admitting: Cardiology

## 2019-08-01 NOTE — Telephone Encounter (Signed)
   Primary Cardiologist: Debbe Odea, MD  Chart reviewed as part of pre-operative protocol coverage. Cataract extractions are recognized in guidelines as low risk surgeries that do not typically require specific preoperative testing or holding of blood thinner therapy. Therefore, given past medical history and time since last visit, based on ACC/AHA guidelines, Danielle Colon would be at acceptable risk for the planned procedure without further cardiovascular testing.   I will route this recommendation to the requesting party via Epic fax function and remove from pre-op pool.  Please call with questions.  Beatriz Stallion, PA-C 08/01/2019, 5:13 PM

## 2019-08-01 NOTE — Telephone Encounter (Signed)
   [facility] Pre-operative Risk Assessment    HEARTCARE STAFF: - Please ensure there is not already an duplicate clearance open for this procedure. - Under Visit Info/Reason for Call, type in Other and utilize the format Clearance MM/DD/YY or Clearance TBD. Do not use dashes or single digits. - If request is for dental extraction, please clarify the # of teeth to be extracted.  Request for surgical clearance:  1. What type of surgery is being performed?  08/20/19    2. When is this surgery scheduled?  Cataract Surgery OS    3. What type of clearance is required (medical clearance vs. Pharmacy clearance to hold med vs. Both)?  both  4. Are there any medications that need to be held prior to surgery and how long? Blood thinners please advise    5. Practice name and name of physician performing surgery?  [facility]    6. What is the office phone number?  [phone]    7.   What is the office fax number? [phone]  8.   Anesthesia type (None, local, MAC, general) ? Not noted    Clarisse Gouge 08/01/2019, 4:45 PM  _________________________________________________________________   (provider comments below)

## 2019-08-03 ENCOUNTER — Telehealth: Payer: Self-pay | Admitting: Physician Assistant

## 2019-08-03 NOTE — Telephone Encounter (Signed)
Paged by Aggie Cosier of [employer] [phone]) with atrial flutter HR 110s. Recent admission for afib and orthostatic syncope. Converted prior to D/C. Baseline rhythm at the start of heart monitor was sinus bradycardia with HR 51 bpm. Now went into atrial flutter. Continue heart monitor as she is likely going in and out of afib/aflutter. Will continue heart monitor to see afib burden. Instructed the patient's daughter to continue her Eliquis and Diltiazem. Keep F/U with Dr. Graciela Husbands

## 2019-08-07 NOTE — Telephone Encounter (Signed)
Noted  

## 2019-08-22 ENCOUNTER — Telehealth: Payer: Self-pay | Admitting: *Deleted

## 2019-08-22 NOTE — Telephone Encounter (Signed)
Call from [employer] at ~9am, with Auto trigger of aflutter, rate 190; day 23 with monitor.   Attempted to call patient twice with no answer and voice mail box is full.   DOD Dr. Excell Seltzer reviewed strips:  Afib with RVR  Patient on Eliquis Known to have PAF

## 2019-09-03 ENCOUNTER — Ambulatory Visit: Payer: Medicare Other | Admitting: Internal Medicine

## 2019-09-03 ENCOUNTER — Other Ambulatory Visit: Payer: Self-pay

## 2019-09-03 ENCOUNTER — Encounter: Payer: Self-pay | Admitting: Internal Medicine

## 2019-09-03 VITALS — BP 106/54 | HR 68 | Ht 62.0 in | Wt 155.0 lb

## 2019-09-03 DIAGNOSIS — R55 Syncope and collapse: Secondary | ICD-10-CM | POA: Diagnosis not present

## 2019-09-03 NOTE — Patient Instructions (Signed)
Medication Instructions:  Your physician recommends that you continue on your current medications as directed. Please refer to the Current Medication list given to you today.  *If you need a refill on your cardiac medications before your next appointment, please call your pharmacy*   Lab Work: CBC and BMET today  If you have labs (blood work) drawn today and your tests are completely normal, you will receive your results only by: . MyChart Message (if you have MyChart) OR . A paper copy in the mail If you have any lab test that is abnormal or we need to change your treatment, we will call you to review the results.   Testing/Procedures: Your physician has recommended that you have a pacemaker inserted. A pacemaker is a small device that is placed under the skin of your chest or abdomen to help control abnormal heart rhythms. This device uses electrical pulses to prompt the heart to beat at a normal rate. Pacemakers are used to treat heart rhythms that are too slow. Wire (leads) are attached to the pacemaker that goes into the chambers of you heart. This is done in the hospital and usually requires and overnight stay. Please see the instruction sheet given to you today for more information.    Follow-Up: At [facility], you and your health needs are our priority.  As part of our continuing mission to provide you with exceptional heart care, we have created designated Provider Care Teams.  These Care Teams include your primary Cardiologist (physician) and Advanced Practice Providers (APPs -  Physician Assistants and Nurse Practitioners) who all work together to provide you with the care you need, when you need it.  We recommend signing up for the patient portal called "MyChart".  Sign up information is provided on this After Visit Summary.  MyChart is used to connect with patients for Virtual Visits (Telemedicine).  Patients are able to view lab/test results, encounter notes, upcoming  appointments, etc.  Non-urgent messages can be sent to your provider as well.   To learn more about what you can do with MyChart, go to [email or web address].    Your next appointment:   To be scheduled  

## 2019-09-03 NOTE — Progress Notes (Signed)
Patient Care Team: Daisy Floro, MD as PCP - General (Family Medicine) Debbe Odea, MD as PCP - Cardiology (Cardiology)   HPI  Danielle Colon is a 86 y.o. female Seen in follow-up from hospital presenting with syncope, found to be in atrial fibrillation.  known hypertension and nonpositional weakness.  Anticoagulated with apixaban @ hosp, converted spontaneously  Given monitor  >> AFlutter with RVR --190 and sinus brady in 50s  No palps  No intercurrent syncope   Date Cr K Hgb  7/21 1.15 3.7 13.         DATE TEST EF   7/15 Echo   60-65 %   7/21 Echo   60 %           Records and Results Reviewed   Past Medical History:  Diagnosis Date  . Bell's palsy 2009;    on the right  . Brain TIA    pt denies this hx on 07/18/2013; "they thought it was a stroke but dr said later that it was Bell's Palsy"  . Cerebrovascular disease   . GERD (gastroesophageal reflux disease)   . Hepatitis    "don't remember what kind; had it ~ 3 times when I was younger; last time was in the 1990's"  . Hyperlipidemia   . Hypertension   . Pancreatitis     Past Surgical History:  Procedure Laterality Date  . APPENDECTOMY  ~ 1971  . CATARACT EXTRACTION Right   . CHOLECYSTECTOMY  ~ 1971  . TONSILLECTOMY  1949    Current Meds  Medication Sig  . acetaminophen (TYLENOL) 325 MG tablet Take 2 tablets (650 mg total) by mouth every 6 (six) hours as needed for mild pain (or Fever >/= 101).  Marland Kitchen apixaban (ELIQUIS) 5 MG TABS tablet Take 1 tablet (5 mg total) by mouth 2 (two) times daily.  Marland Kitchen ascorbic acid (VITAMIN C) 500 MG tablet Take 500 mg by mouth daily.  . cholecalciferol (VITAMIN D3) 25 MCG (1000 UNIT) tablet Take 1,000 Units by mouth daily.  . lipase/protease/amylase 24000-76000 units CPEP Take 1 capsule (24,000 Units total) by mouth 3 (three) times daily before meals.  Marland Kitchen losartan (COZAAR) 50 MG tablet Take 1 tablet (50 mg total) by mouth daily.  . pantoprazole (PROTONIX) 40  MG tablet Take 1 tablet (40 mg total) by mouth daily.    Allergies  Allergen Reactions  . Tessalon Perles [Benzonatate] Other (See Comments)  . Neurontin [Gabapentin] Other (See Comments)  . Zantac [Ranitidine Hcl] Other (See Comments)    GI upset      Review of Systems negative except from HPI and PMH  Physical Exam BP (!) 106/54   Pulse 68   Ht 5\' 2"  (1.575 m)   Wt 155 lb (70.3 kg)   SpO2 98%   BMI 28.35 kg/m  Well developed and well nourished in no acute distress HENT normal E scleral and icterus clear Neck Supple JVP flat; carotids brisk and full Clear to ausculation Regular rate and rhythm, no murmurs gallops or rub Soft with active bowel sounds No clubbing cyanosis  Edema Alert and oriented, grossly normal motor and sensory function Skin Warm and Dry  ECG sinus @ 68 18/08/41  Scanned Event recorder strip >> As above   CrCl cannot be calculated (Patient's most recent lab result is older than the maximum 21 days allowed.).   Assessment and  Plan  Atrial flutter RVR  Sinus Brady  Syncope  She is  having recurrent tachycardia >> 190+ bpm, without palpitations;    No interval LH or syncope   With tachybrady and syncope have recommended that we proceed with pacing  The benefits and risks were reviewed including but not limited to death,  perforation, infection, lead dislodgement and device malfunction.  The patient understands agrees and is willing to proceed.  On Anticoagulation;  No bleeding issues      Current medicines are reviewed at length with the patient today .  The patient does not  have concerns regarding medicines.  

## 2019-09-03 NOTE — H&P (View-Only) (Signed)
Patient Care Team: Daisy Floro, MD as PCP - General (Family Medicine) Debbe Odea, MD as PCP - Cardiology (Cardiology)   HPI  Danielle Colon is a 86 y.o. female Seen in follow-up from hospital presenting with syncope, found to be in atrial fibrillation.  known hypertension and nonpositional weakness.  Anticoagulated with apixaban @ hosp, converted spontaneously  Given monitor  >> AFlutter with RVR --190 and sinus brady in 50s  No palps  No intercurrent syncope   Date Cr K Hgb  7/21 1.15 3.7 13.         DATE TEST EF   7/15 Echo   60-65 %   7/21 Echo   60 %           Records and Results Reviewed   Past Medical History:  Diagnosis Date  . Bell's palsy 2009;    on the right  . Brain TIA    pt denies this hx on 07/18/2013; "they thought it was a stroke but dr said later that it was Bell's Palsy"  . Cerebrovascular disease   . GERD (gastroesophageal reflux disease)   . Hepatitis    "don't remember what kind; had it ~ 3 times when I was younger; last time was in the 1990's"  . Hyperlipidemia   . Hypertension   . Pancreatitis     Past Surgical History:  Procedure Laterality Date  . APPENDECTOMY  ~ 1971  . CATARACT EXTRACTION Right   . CHOLECYSTECTOMY  ~ 1971  . TONSILLECTOMY  1949    Current Meds  Medication Sig  . acetaminophen (TYLENOL) 325 MG tablet Take 2 tablets (650 mg total) by mouth every 6 (six) hours as needed for mild pain (or Fever >/= 101).  Marland Kitchen apixaban (ELIQUIS) 5 MG TABS tablet Take 1 tablet (5 mg total) by mouth 2 (two) times daily.  Marland Kitchen ascorbic acid (VITAMIN C) 500 MG tablet Take 500 mg by mouth daily.  . cholecalciferol (VITAMIN D3) 25 MCG (1000 UNIT) tablet Take 1,000 Units by mouth daily.  . lipase/protease/amylase 24000-76000 units CPEP Take 1 capsule (24,000 Units total) by mouth 3 (three) times daily before meals.  Marland Kitchen losartan (COZAAR) 50 MG tablet Take 1 tablet (50 mg total) by mouth daily.  . pantoprazole (PROTONIX) 40  MG tablet Take 1 tablet (40 mg total) by mouth daily.    Allergies  Allergen Reactions  . Tessalon Perles [Benzonatate] Other (See Comments)  . Neurontin [Gabapentin] Other (See Comments)  . Zantac [Ranitidine Hcl] Other (See Comments)    GI upset      Review of Systems negative except from HPI and PMH  Physical Exam BP (!) 106/54   Pulse 68   Ht 5\' 2"  (1.575 m)   Wt 155 lb (70.3 kg)   SpO2 98%   BMI 28.35 kg/m  Well developed and well nourished in no acute distress HENT normal E scleral and icterus clear Neck Supple JVP flat; carotids brisk and full Clear to ausculation Regular rate and rhythm, no murmurs gallops or rub Soft with active bowel sounds No clubbing cyanosis  Edema Alert and oriented, grossly normal motor and sensory function Skin Warm and Dry  ECG sinus @ 68 18/08/41  Scanned Event recorder strip >> As above   CrCl cannot be calculated (Patient's most recent lab result is older than the maximum 21 days allowed.).   Assessment and  Plan  Atrial flutter RVR  Sinus Brady  Syncope  She is  having recurrent tachycardia >> 190+ bpm, without palpitations;    No interval LH or syncope   With tachybrady and syncope have recommended that we proceed with pacing  The benefits and risks were reviewed including but not limited to death,  perforation, infection, lead dislodgement and device malfunction.  The patient understands agrees and is willing to proceed.  On Anticoagulation;  No bleeding issues      Current medicines are reviewed at length with the patient today .  The patient does not  have concerns regarding medicines.

## 2019-09-04 LAB — CBC
Hematocrit: 35.3 % (ref 34.0–46.6)
Hemoglobin: 12.3 g/dL (ref 11.1–15.9)
MCH: 31 pg (ref 26.6–33.0)
MCHC: 34.8 g/dL (ref 31.5–35.7)
MCV: 89 fL (ref 79–97)
Platelets: 179 10*3/uL (ref 150–450)
RBC: 3.97 x10E6/uL (ref 3.77–5.28)
RDW: 12 % (ref 11.7–15.4)
WBC: 7.1 10*3/uL (ref 3.4–10.8)

## 2019-09-04 LAB — BASIC METABOLIC PANEL
BUN/Creatinine Ratio: 17 (ref 12–28)
BUN: 16 mg/dL (ref 8–27)
CO2: 26 mmol/L (ref 20–29)
Calcium: 9.5 mg/dL (ref 8.7–10.3)
Chloride: 105 mmol/L (ref 96–106)
Creatinine, Ser: 0.95 mg/dL (ref 0.57–1.00)
GFR calc Af Amer: 62 mL/min/{1.73_m2} (ref >59)
GFR calc non Af Amer: 54 mL/min/{1.73_m2} — ABNORMAL LOW (ref >59)
Glucose: 177 mg/dL — ABNORMAL HIGH (ref 65–99)
Potassium: 3.8 mmol/L (ref 3.5–5.2)
Sodium: 143 mmol/L (ref 134–144)

## 2019-09-06 ENCOUNTER — Telehealth: Payer: Self-pay

## 2019-09-06 NOTE — Telephone Encounter (Signed)
Spoke with pt's daughter, DPR and reviewed Implant instruction letter for device implant scheduled for 09/27/2019.  Pt's daughter verbalizes understanding and agrees with current plan.  She thanked Charity fundraiser for call.

## 2019-09-24 ENCOUNTER — Other Ambulatory Visit (HOSPITAL_COMMUNITY)
Admission: RE | Admit: 2019-09-24 | Discharge: 2019-09-24 | Disposition: A | Discharge status: Home / Self Care | Payer: Medicare Other | Source: Ambulatory Visit | Attending: Internal Medicine | Admitting: Internal Medicine

## 2019-09-24 DIAGNOSIS — Z01812 Encounter for preprocedural laboratory examination: Secondary | ICD-10-CM | POA: Insufficient documentation

## 2019-09-24 DIAGNOSIS — Z20822 Contact with and (suspected) exposure to covid-19: Secondary | ICD-10-CM | POA: Diagnosis not present

## 2019-09-25 LAB — SARS CORONAVIRUS 2 (TAT 6-24 HRS): SARS Coronavirus 2: NEGATIVE

## 2019-09-26 NOTE — Progress Notes (Signed)
Instructed patient on the following items: Arrival time  0530 Nothing to eat or drink after midnight No meds AM of procedure Responsible person to drive you home and stay with you for 24 hrs Wash with special soap night before and morning of procedure If on anti-coagulant drug instructions Hold tonight and in the morning dose of Eliquis

## 2019-09-27 ENCOUNTER — Ambulatory Visit (HOSPITAL_COMMUNITY)
Admission: RE | Admit: 2019-09-27 | Discharge: 2019-09-27 | Disposition: A | Discharge status: Home / Self Care | Payer: Medicare Other | Attending: Internal Medicine | Admitting: Internal Medicine

## 2019-09-27 ENCOUNTER — Ambulatory Visit (HOSPITAL_COMMUNITY): Payer: Medicare Other

## 2019-09-27 ENCOUNTER — Ambulatory Visit (HOSPITAL_COMMUNITY)
Admission: RE | Disposition: A | Discharge status: Home / Self Care | Payer: Self-pay | Source: Home / Self Care | Attending: Internal Medicine

## 2019-09-27 DIAGNOSIS — Z79899 Other long term (current) drug therapy: Secondary | ICD-10-CM | POA: Insufficient documentation

## 2019-09-27 DIAGNOSIS — Z7901 Long term (current) use of anticoagulants: Secondary | ICD-10-CM | POA: Insufficient documentation

## 2019-09-27 DIAGNOSIS — I10 Essential (primary) hypertension: Secondary | ICD-10-CM | POA: Insufficient documentation

## 2019-09-27 DIAGNOSIS — K219 Gastro-esophageal reflux disease without esophagitis: Secondary | ICD-10-CM | POA: Insufficient documentation

## 2019-09-27 DIAGNOSIS — E785 Hyperlipidemia, unspecified: Secondary | ICD-10-CM | POA: Insufficient documentation

## 2019-09-27 DIAGNOSIS — Z959 Presence of cardiac and vascular implant and graft, unspecified: Secondary | ICD-10-CM

## 2019-09-27 DIAGNOSIS — I4891 Unspecified atrial fibrillation: Secondary | ICD-10-CM | POA: Diagnosis not present

## 2019-09-27 DIAGNOSIS — R531 Weakness: Secondary | ICD-10-CM | POA: Diagnosis not present

## 2019-09-27 DIAGNOSIS — R55 Syncope and collapse: Secondary | ICD-10-CM | POA: Diagnosis not present

## 2019-09-27 DIAGNOSIS — I495 Sick sinus syndrome: Secondary | ICD-10-CM | POA: Insufficient documentation

## 2019-09-27 HISTORY — PX: PACEMAKER IMPLANT: EP1218

## 2019-09-27 SURGERY — PACEMAKER IMPLANT

## 2019-09-27 MED ORDER — CEFAZOLIN SODIUM-DEXTROSE 1-4 GM/50ML-% IV SOLN: 1.0000 g | Freq: Four times a day (QID) | INTRAVENOUS | Status: DC

## 2019-09-27 MED ORDER — LIDOCAINE HCL (PF) 1 % IJ SOLN
INTRAMUSCULAR | Status: AC
  Filled 2019-09-27: qty 60

## 2019-09-27 MED ORDER — FENTANYL CITRATE (PF) 100 MCG/2ML IJ SOLN
INTRAMUSCULAR | Status: DC | PRN
Start: 2019-09-27 — End: 2019-09-27
  Administered 2019-09-27 (×2): 12.5 ug via INTRAVENOUS

## 2019-09-27 MED ORDER — FENTANYL CITRATE (PF) 100 MCG/2ML IJ SOLN
INTRAMUSCULAR | Status: AC
  Filled 2019-09-27: qty 2

## 2019-09-27 MED ORDER — SODIUM CHLORIDE 0.9 % IV SOLN
INTRAVENOUS | Status: AC
  Filled 2019-09-27: qty 2

## 2019-09-27 MED ORDER — LIDOCAINE HCL (PF) 1 % IJ SOLN
INTRAMUSCULAR | Status: DC | PRN
  Administered 2019-09-27: 50 mL

## 2019-09-27 MED ORDER — ACETAMINOPHEN 325 MG PO TABS
325.0000 mg | ORAL_TABLET | ORAL | Status: DC | PRN
  Administered 2019-09-27: 650 mg via ORAL
  Filled 2019-09-27: qty 2
  Filled 2019-09-27: qty 1

## 2019-09-27 MED ORDER — MIDAZOLAM HCL 5 MG/5ML IJ SOLN
INTRAMUSCULAR | Status: AC
  Filled 2019-09-27: qty 5

## 2019-09-27 MED ORDER — DILTIAZEM HCL ER COATED BEADS 120 MG PO CP24: 120.0000 mg | ORAL_CAPSULE | Freq: Two times a day (BID) | ORAL | 6 refills | Status: DC

## 2019-09-27 MED ORDER — CEFAZOLIN SODIUM-DEXTROSE 2-4 GM/100ML-% IV SOLN
INTRAVENOUS | Status: AC
  Filled 2019-09-27: qty 100

## 2019-09-27 MED ORDER — MIDAZOLAM HCL 5 MG/5ML IJ SOLN
INTRAMUSCULAR | Status: DC | PRN
  Administered 2019-09-27 (×2): 0.5 mg via INTRAVENOUS

## 2019-09-27 MED ORDER — SODIUM CHLORIDE 0.9 % IV SOLN: INTRAVENOUS | Status: DC

## 2019-09-27 MED ORDER — HEPARIN (PORCINE) IN NACL 1000-0.9 UT/500ML-% IV SOLN
INTRAVENOUS | Status: AC
  Filled 2019-09-27: qty 500

## 2019-09-27 MED ORDER — CEFAZOLIN SODIUM-DEXTROSE 2-4 GM/100ML-% IV SOLN
2.0000 g | INTRAVENOUS | Status: AC
  Administered 2019-09-27: 2 g via INTRAVENOUS

## 2019-09-27 MED ORDER — ONDANSETRON HCL 4 MG/2ML IJ SOLN: 4.0000 mg | Freq: Four times a day (QID) | INTRAMUSCULAR | Status: DC | PRN

## 2019-09-27 MED ORDER — HEPARIN (PORCINE) IN NACL 1000-0.9 UT/500ML-% IV SOLN
INTRAVENOUS | Status: DC | PRN
  Administered 2019-09-27: 500 mL

## 2019-09-27 MED ORDER — SODIUM CHLORIDE 0.9 % IV SOLN
80.0000 mg | INTRAVENOUS | Status: AC
  Administered 2019-09-27: 80 mg

## 2019-09-27 SURGICAL SUPPLY — 10 items
CABLE SURGICAL S-101-97-12 (CABLE) ×3 IMPLANT
HEMOSTAT SURGICEL 2X4 FIBR (HEMOSTASIS) ×3 IMPLANT
IPG PACE AZUR XT DR MRI W1DR01 (Pacemaker) ×1 IMPLANT
KIT MICROPUNCTURE NIT STIFF (SHEATH) ×2 IMPLANT
LEAD CAPSURE NOVUS 45CM (Lead) ×3 IMPLANT
LEAD CAPSURE NOVUS 5076-52CM (Lead) ×3 IMPLANT
PACE AZURE XT DR MRI W1DR01 (Pacemaker) ×3 IMPLANT
PAD PRO RADIOLUCENT 2001M-C (PAD) ×3 IMPLANT
SHEATH 7FR PRELUDE SNAP 13 (SHEATH) ×6 IMPLANT
TRAY PACEMAKER INSERTION (PACKS) ×3 IMPLANT

## 2019-09-27 NOTE — Progress Notes (Signed)
D/c instructions discussed and reviewed with pt at bedside and daughter via phone, verbalized understanding. Will continue to monitor

## 2019-09-27 NOTE — Discharge Instructions (Signed)
Pacemaker Implantation, Adult, Care After This sheet gives you information about how to care for yourself after your procedure. Your health care provider may also give you more specific instructions. If you have problems or questions, contact your health care provider. What can I expect after the procedure? After the procedure, it is common to have:  Mild pain.  Slight bruising.  Some swelling over the incision.  A slight bump over the skin where the device was placed. Sometimes, it is possible to feel the device under the skin. This is normal. Follow these instructions at home: Medicines  Take over-the-counter and prescription medicines only as told by your health care provider.  If you were prescribed an antibiotic medicine, take it as told by your health care provider. Do not stop taking the antibiotic even if you start to feel better. Wound care   Do not remove the bandage on your chest until directed to do so by your health care provider.  After your bandage is removed, you may see pieces of tape called skin adhesive strips over the area where the cut was made (incision site). Let them fall off on their own.  Check the incision site every day to make sure it is not infected, bleeding, or starting to pull apart.  Do not use lotions or ointments near the incision site unless directed to do so.  Keep the incision area clean and dry for 2-3 days after the procedure or as directed by your health care provider. It takes several weeks for the incision site to completely heal.  Do not take baths, swim, or use a hot tub for 7-10 days or as otherwise directed by your health care provider. Activity  Do not drive or use heavy machinery while taking prescription pain medicine.  Do not drive for 24 hours if you were given a medicine to help you relax (sedative).  Check with your health care provider before you start to drive or play sports.  Avoid sudden jerking, pulling, or chopping  movements that pull your upper arm far away from your body. Avoid these movements for at least 6 weeks or as long as told by your health care provider.  Do not lift your upper arm above your shoulders for at least 6 weeks or as long as told by your health care provider. This means no tennis, golf, or swimming.  You may go back to work when your health care provider says it is okay. Pacemaker care  You may be shown how to transfer data from your pacemaker through the phone to your health care provider.  Always let all health care providers know about your pacemaker before you have any medical procedures or tests.  Wear a medical ID bracelet or necklace stating that you have a pacemaker. Carry a pacemaker ID card with you at all times.  Your pacemaker battery will last for 5-15 years. Routine checks by your health care provider will let the health care provider know when the battery is starting to run down. The pacemaker will need to be replaced when the battery starts to run down.  Do not use amateur Proofreader. Other electrical devices are safe to use, including power tools, lawn mowers, and speakers. If you are unsure of whether something is safe to use, ask your health care provider.  When using your cell phone, hold it to the ear opposite the pacemaker. Do not leave your cell phone in a pocket over the pacemaker.  Avoid places or objects that have a strong electric or magnetic field, including: ? Airport Actuary. When at the airport, let officials know that you have a pacemaker. ? Power plants. ? Large electrical generators. ? Radiofrequency transmission towers, such as cell phone and radio towers. General instructions  Weigh yourself every day. If you suddenly gain weight, fluid may be building up in your body.  Keep all follow-up visits as told by your health care provider. This is important. Contact a health care provider if:  You gain  weight suddenly.  Your legs or feet swell.  It feels like your heart is fluttering or skipping beats (heart palpitations).  You have chills or a fever.  You have more redness, swelling, or pain around your incisions.  You have more fluid or blood coming from your incisions.  Your incisions feel warm to the touch.  You have pus or a bad smell coming from your incisions. Get help right away if:  You have chest pain.  You have trouble breathing or are short of breath.  You become extremely tired.  You are light-headed or you faint. This information is not intended to replace advice given to you by your health care provider. Make sure you discuss any questions you have with your health care provider. Document Revised: 12/16/2016 Document Reviewed: 10/16/2015 Elsevier Patient Education  2020 [employer].  Dermabond will come off in next 10-14 days, if not will remove at wound check Keep wound dry until tomorrow evening No driving x4 days Wound check in office as scheduled

## 2019-09-27 NOTE — Interval H&P Note (Signed)
History and Physical Interval Note:  09/27/2019 7:49 AM  Margot Chimes  has presented today for surgery, with the diagnosis of tachibrady - syncope.  The various methods of treatment have been discussed with the patient and family. After consideration of risks, benefits and other options for treatment, the patient has consented to  Procedure(s): PACEMAKER IMPLANT (N/A) as a surgical intervention.  The patient's history has been reviewed, patient examined, no change in status, stable for surgery.  I have reviewed the patient's chart and labs.  Questions were answered to the patient's satisfaction.     Sherryl Manges  The benefits and risks were reviewed including but not limited to death,  perforation, infection, lead dislodgement and device malfunction.  The patient understands agrees and is willing to proceed.

## 2019-09-30 ENCOUNTER — Encounter (HOSPITAL_COMMUNITY): Payer: Self-pay | Admitting: Internal Medicine

## 2019-09-30 ENCOUNTER — Telehealth: Payer: Self-pay | Admitting: *Deleted

## 2019-09-30 MED FILL — Gentamicin Sulfate Inj 40 MG/ML: INTRAMUSCULAR | Qty: 80 | Status: AC

## 2019-09-30 MED FILL — Cefazolin Sodium-Dextrose IV Solution 2 GM/100ML-4%: INTRAVENOUS | Qty: 100 | Status: AC

## 2019-09-30 NOTE — Telephone Encounter (Signed)
Follow-up after same day discharge: Implant date: 09/27/19 MD: Sherryl Manges, MD Device: Medtronic PPM Location: left chest   Wound check visit: 10/08/19 91 day MD follow-up: 01/08/20  Remote Transmission received: Yes, normal PPM function  Dressing removed: Not yet, pt agrees to remove occlusive dressing this afternoon.  Reviewed discharge instructions and activity progression instructions with pt. Pt verbalizes understanding. Pt agrees to remove occlusive dressing, able to shower as Dermabond was used to close incision. Pt aware to call the DC for any wound/infection concerns prior to wound check. Denies additional questions at this time.

## 2019-09-30 NOTE — Telephone Encounter (Signed)
-----   Message from Graciella Freer, PA-C sent at 09/27/2019  2:56 PM EDT ----- Same day D/c pacer from Friday - SK

## 2019-10-08 ENCOUNTER — Ambulatory Visit (INDEPENDENT_AMBULATORY_CARE_PROVIDER_SITE_OTHER): Payer: Medicare Other | Admitting: Emergency Medicine

## 2019-10-08 ENCOUNTER — Other Ambulatory Visit: Payer: Self-pay

## 2019-10-08 DIAGNOSIS — R55 Syncope and collapse: Secondary | ICD-10-CM | POA: Diagnosis not present

## 2019-10-08 DIAGNOSIS — I495 Sick sinus syndrome: Secondary | ICD-10-CM

## 2019-10-08 DIAGNOSIS — Z950 Presence of cardiac pacemaker: Secondary | ICD-10-CM | POA: Diagnosis not present

## 2019-10-08 LAB — CUP PACEART INCLINIC DEVICE CHECK
Battery Remaining Longevity: 155 mo
Battery Voltage: 3.22 V
Brady Statistic AP VP Percent: 0.04 %
Brady Statistic AP VS Percent: 65.62 %
Brady Statistic AS VP Percent: 0.01 %
Brady Statistic AS VS Percent: 34.34 %
Brady Statistic RA Percent Paced: 61.39 %
Brady Statistic RV Percent Paced: 0.5 %
Date Time Interrogation Session: 20210921090233
Implantable Lead Implant Date: 20210910
Implantable Lead Implant Date: 20210910
Implantable Lead Location: 753859
Implantable Lead Location: 753860
Implantable Lead Model: 5076
Implantable Lead Model: 5076
Implantable Pulse Generator Implant Date: 20210910
Lead Channel Impedance Value: 323 Ohm
Lead Channel Impedance Value: 361 Ohm
Lead Channel Impedance Value: 418 Ohm
Lead Channel Impedance Value: 456 Ohm
Lead Channel Pacing Threshold Amplitude: 0.5 V
Lead Channel Pacing Threshold Amplitude: 1 V
Lead Channel Pacing Threshold Pulse Width: 0.4 ms
Lead Channel Pacing Threshold Pulse Width: 0.4 ms
Lead Channel Sensing Intrinsic Amplitude: 0.875 mV
Lead Channel Sensing Intrinsic Amplitude: 4.25 mV
Lead Channel Setting Pacing Amplitude: 3.5 V
Lead Channel Setting Pacing Amplitude: 3.5 V
Lead Channel Setting Pacing Pulse Width: 0.4 ms
Lead Channel Setting Sensing Sensitivity: 1.2 mV

## 2019-10-08 NOTE — Progress Notes (Signed)
Wound check appointment. Dermabond removed. Wound without redness or edema. Incision edges approximated, wound well healed. Normal device function. Thresholds, sensing, and impedances consistent with implant measurements. Device programmed at 3.5V/auto capture programmed on for extra safety margin until 3 month visit. Histogram distribution appropriate for patient and level of activity. AT.AF burden 6.4 %, 6 AMS episodes that appear to be AF with controlled v-rates, longest  8 hours 21 minutes, + Eliquis. No high ventricular rates noted. Patient educated about wound care, arm mobility, lifting restrictions. ROV with Dr Graciela Husbands 01/08/20. Enrolled in remote follow up and next remote scheduled for 12/27/19.

## 2019-10-10 ENCOUNTER — Telehealth: Payer: Self-pay

## 2019-10-10 NOTE — Telephone Encounter (Signed)
Danielle Colon(DPR) called because her Mom has had several episodes of bright red bleeding with stools over the past 2 weeks. Her mother will not follow up with PCP and insisted she call this office. Explained she needs follow-up with PCP to assess source of rectal bleeding. Asked to contact the patient to discuss follow up because she " will not listen" to her daughter and seek treatment by PCP. Spoke to patient and she reports intermittent episodes of bright red bleeding with stools. She reports no bleeding or dark tarry stool with last BM. She reports no lightheadedness, dizziness, syncope, nausea or emesis. Patient advised to follow-up with PCP and if she has another bright red stool to seek evaluation in ED.Daughter , Danielle Colon , called and given instructions for follow-up with PCP and ED precautions at daughter's request.

## 2019-10-10 NOTE — Telephone Encounter (Signed)
Pt daughter [name] who is on the dpr states the pt is bleeding from her rectum. I called her and let her speak with the nurse Arline Asp, rn.

## 2019-10-11 ENCOUNTER — Other Ambulatory Visit: Payer: Self-pay

## 2019-10-11 ENCOUNTER — Emergency Department (HOSPITAL_COMMUNITY)
Admission: EM | Admit: 2019-10-11 | Discharge: 2019-10-11 | Disposition: A | Discharge status: Home / Self Care | Payer: Medicare Other | Attending: Emergency Medicine | Admitting: Emergency Medicine

## 2019-10-11 DIAGNOSIS — D696 Thrombocytopenia, unspecified: Secondary | ICD-10-CM | POA: Diagnosis not present

## 2019-10-11 DIAGNOSIS — Z7901 Long term (current) use of anticoagulants: Secondary | ICD-10-CM | POA: Diagnosis not present

## 2019-10-11 DIAGNOSIS — Z79899 Other long term (current) drug therapy: Secondary | ICD-10-CM | POA: Diagnosis not present

## 2019-10-11 DIAGNOSIS — I10 Essential (primary) hypertension: Secondary | ICD-10-CM | POA: Diagnosis not present

## 2019-10-11 DIAGNOSIS — K625 Hemorrhage of anus and rectum: Secondary | ICD-10-CM

## 2019-10-11 LAB — COMPREHENSIVE METABOLIC PANEL
ALT: 17 U/L (ref 0–44)
AST: 19 U/L (ref 15–41)
Albumin: 3.8 g/dL (ref 3.5–5.0)
Alkaline Phosphatase: 82 U/L (ref 38–126)
Anion gap: 8 (ref 5–15)
BUN: 10 mg/dL (ref 8–23)
CO2: 25 mmol/L (ref 22–32)
Calcium: 10 mg/dL (ref 8.9–10.3)
Chloride: 106 mmol/L (ref 98–111)
Creatinine, Ser: 0.99 mg/dL (ref 0.44–1.00)
GFR calc Af Amer: 59 mL/min — ABNORMAL LOW (ref >60)
GFR calc non Af Amer: 51 mL/min — ABNORMAL LOW (ref >60)
Glucose, Bld: 100 mg/dL — ABNORMAL HIGH (ref 70–99)
Potassium: 4 mmol/L (ref 3.5–5.1)
Sodium: 139 mmol/L (ref 135–145)
Total Bilirubin: 0.6 mg/dL (ref 0.3–1.2)
Total Protein: 7.1 g/dL (ref 6.5–8.1)

## 2019-10-11 LAB — CBC WITH DIFFERENTIAL/PLATELET
Abs Immature Granulocytes: 0.01 10*3/uL (ref 0.00–0.07)
Basophils Absolute: 0.1 10*3/uL (ref 0.0–0.1)
Basophils Relative: 1 %
Eosinophils Absolute: 0.3 10*3/uL (ref 0.0–0.5)
Eosinophils Relative: 5 %
HCT: 39.3 % (ref 36.0–46.0)
Hemoglobin: 12.6 g/dL (ref 12.0–15.0)
Immature Granulocytes: 0 %
Lymphocytes Relative: 34 %
Lymphs Abs: 2.2 10*3/uL (ref 0.7–4.0)
MCH: 29.7 pg (ref 26.0–34.0)
MCHC: 32.1 g/dL (ref 30.0–36.0)
MCV: 92.7 fL (ref 80.0–100.0)
Monocytes Absolute: 0.6 10*3/uL (ref 0.1–1.0)
Monocytes Relative: 10 %
Neutro Abs: 3.2 10*3/uL (ref 1.7–7.7)
Neutrophils Relative %: 50 %
Platelets: 143 10*3/uL — ABNORMAL LOW (ref 150–400)
RBC: 4.24 MIL/uL (ref 3.87–5.11)
RDW: 12.9 % (ref 11.5–15.5)
WBC: 6.4 10*3/uL (ref 4.0–10.5)
nRBC: 0 % (ref 0.0–0.2)

## 2019-10-11 LAB — POC OCCULT BLOOD, ED: Fecal Occult Bld: POSITIVE — AB

## 2019-10-11 LAB — ABO/RH: ABO/RH(D): A POS

## 2019-10-11 LAB — TYPE AND SCREEN
ABO/RH(D): A POS
Antibody Screen: NEGATIVE

## 2019-10-11 NOTE — Discharge Instructions (Signed)
Return if your bleeding is getting worse, or if you start feeling dizzy.  Work with your primary care provider to get an appointment with a gastroenterologist.

## 2019-10-11 NOTE — ED Provider Notes (Signed)
[facility] EMERGENCY DEPARTMENT Provider Note   CSN: [id number] Arrival date & time: 10/11/19  0445   History Chief Complaint  Patient presents with  . GI Bleeding    Danielle Colon is a 86 y.o. female.  The history is provided by the patient.  She has history of hypertension, hyperlipidemia, atrial fibrillation and had been anticoagulated with apixaban and comes in because of rectal bleeding.  She has had bright red blood per rectum for the last 3 days and states that it is getting worse.  Her PCP advised her to discontinue apixaban, and last dose was taken yesterday morning.  She denies abdominal pain, nausea, vomiting.  She denies any back pain.  She denies dizziness or lightheadedness.  She denies prior episodes of GI bleeding.  Past Medical History:  Diagnosis Date  . Bell's palsy 2009;    on the right  . Brain TIA    pt denies this hx on 07/18/2013; "they thought it was a stroke but dr said later that it was Bell's Palsy"  . Cerebrovascular disease   . GERD (gastroesophageal reflux disease)   . Hepatitis    "don't remember what kind; had it ~ 3 times when I was younger; last time was in the 1990's"  . Hyperlipidemia   . Hypertension   . Pancreatitis     Patient Active Problem List   Diagnosis Date Noted  . Syncope 07/23/2019  . Atrial fibrillation (HCC) 07/22/2019  . Atrial fibrillation with RVR (HCC) 07/22/2019  . Syncope and collapse 07/22/2019  . Hypokalemia 03/01/2019  . Anemia 03/01/2019  . Pancreatitis 02/28/2019  . Hyperlipidemia 02/28/2019  . Obesity (BMI 30.0-34.9) 02/28/2019  . Hypoxia 02/28/2019  . GERD (gastroesophageal reflux disease) 07/19/2013  . Urinary incontinence 07/19/2013  . Lung nodule seen on imaging study 07/19/2013  . Benign hypertension 07/18/2013  . History of Bell's palsy 06/27/2013    Past Surgical History:  Procedure Laterality Date  . APPENDECTOMY  ~ 1971  . CATARACT EXTRACTION Right   . CHOLECYSTECTOMY  ~  1971  . PACEMAKER IMPLANT N/A 09/27/2019   Procedure: PACEMAKER IMPLANT;  Surgeon: Duke Salvia, MD;  Location: [facility] INVASIVE CV LAB;  Service: Cardiovascular;  Laterality: N/A;  . TONSILLECTOMY  1949     OB History   No obstetric history on file.     Family History  Problem Relation Age of Onset  . Pneumonia Mother        old age  . Heart attack Father   . Diabetes Father   . Diabetes Sister   . Diabetes Brother   . Cancer Sister   . Diabetes Sister   . Diabetes Sister     Social History   Tobacco Use  . Smoking status: Never Smoker  . Smokeless tobacco: Never Used  Vaping Use  . Vaping Use: Never used  Substance Use Topics  . Alcohol use: No  . Drug use: No    Home Medications Prior to Admission medications   Medication Sig Start Date End Date Taking? Authorizing Provider  acetaminophen (TYLENOL) 325 MG tablet Take 2 tablets (650 mg total) by mouth every 6 (six) hours as needed for mild pain (or Fever >/= 101). 03/05/19  Yes Myrlene Broker, MD  apixaban (ELIQUIS) 5 MG TABS tablet Take 1 tablet (5 mg total) by mouth 2 (two) times daily. 07/23/19  Yes Glade Lloyd, MD  ascorbic acid (VITAMIN C) 500 MG tablet Take 500 mg by mouth daily.  Yes [provider]  cholecalciferol (VITAMIN D3) 25 MCG (1000 UNIT) tablet Take 1,000 Units by mouth at bedtime.    Yes [provider]  diltiazem (CARDIZEM CD) 120 MG 24 hr capsule Take 1 capsule (120 mg total) by mouth 2 (two) times daily. 09/27/19 10/27/19 Yes Graciella Freer, PA-C  lipase/protease/amylase [phone] units CPEP Take 1 capsule (24,000 Units total) by mouth 3 (three) times daily before meals. 03/05/19  Yes Myrlene Broker, MD  multivitamin-lutein [facility]) CAPS capsule Take 1 capsule by mouth daily.   Yes [provider]  polyethylene glycol powder (GLYCOLAX/MIRALAX) 17 GM/SCOOP powder Take 17 g by mouth daily as needed for moderate constipation.  09/16/19  Yes  [provider]  losartan (COZAAR) 50 MG tablet Take 1 tablet (50 mg total) by mouth daily. 07/24/19   Glade Lloyd, MD    Allergies    Tessalon perles [benzonatate], Neurontin [gabapentin], and Zantac [ranitidine hcl]  Review of Systems   Review of Systems  All other systems reviewed and are negative.   Physical Exam Updated Vital Signs BP (!) 166/71 (BP Location: Left Arm)   Pulse 80   Temp 99 F (37.2 C)   Resp 20   SpO2 100%   Physical Exam Vitals and nursing note reviewed.   86 year old female, resting comfortably and in no acute distress. Vital signs are significant for elevated blood pressure. Oxygen saturation is 100%, which is normal. Head is normocephalic and atraumatic. PERRLA, EOMI. Oropharynx is clear. Neck is nontender and supple without adenopathy or JVD. Back is nontender and there is no CVA tenderness. Lungs are clear without rales, wheezes, or rhonchi. Chest is nontender.  Pacemaker present in left subclavian area with suture line healing well. Heart has regular rate and rhythm without murmur. Abdomen is soft, flat, nontender without masses or hepatosplenomegaly and peristalsis is normoactive. Rectal: Normal sphincter tone, small amount of bright red blood present in the rectal ampulla. Extremities have no cyanosis or edema, full range of motion is present. Skin is warm and dry without rash. Neurologic: Mental status is normal, cranial nerves are intact, there are no motor or sensory deficits.  ED Results / Procedures / Treatments   Labs (all labs ordered are listed, but only abnormal results are displayed) Labs Reviewed  COMPREHENSIVE METABOLIC PANEL - Abnormal; Notable for the following components:      Result Value   Glucose, Bld 100 (*)    GFR calc non Af Amer 51 (*)    GFR calc Af Amer 59 (*)    All other components within normal limits  CBC WITH DIFFERENTIAL/PLATELET - Abnormal; Notable for the following components:   Platelets 143 (*)     All other components within normal limits  POC OCCULT BLOOD, ED - Abnormal; Notable for the following components:   Fecal Occult Bld POSITIVE (*)    All other components within normal limits  TYPE AND SCREEN  ABO/RH   Procedures Procedures   Medications Ordered in ED Medications - No data to display  ED Course  I have reviewed the triage vital signs and the nursing notes.  Pertinent labs & imaging results that were available during my care of the patient were reviewed by me and considered in my medical decision making (see chart for details).  MDM Rules/Calculators/A&P Rectal bleeding and patient anticoagulated with apixaban.  Last dose of apixaban was just under 24 hours ago, so anticoagulation effects should be subsiding.  We will need to check  screening labs and will check orthostatic vital signs.  Old records are reviewed confirming history of atrial fibrillation and prior anticoagulation with apixaban.  Also, insertion of cardiac pacemaker on 09/27/2019.  Orthostatic vital signs showed no significant change in blood pressure or heart rate.  Hemoglobin is actually higher than last value from 09/03/2019.  Mild thrombocytopenia is present and not felt to be clinically significant.  Patient states that her primary care provider is trying to get a gastroenterology consult.  At this point, she is safe for discharge.  She states that the pace of her rectal bleeding has actually decreased, and there is only a minimal amount of blood present on rectal exam.  She is discharged with instructions to not take her apixaban and continue to work with her primary care provider to get a gastroenterology consult for colonoscopy.  Advised to return if she starts feeling dizzy or if bleeding seems like it is getting worse.  Final Clinical Impression(s) / ED Diagnoses Final diagnoses:  Rectal bleeding  Thrombocytopenia [facility])    Rx / DC Orders ED Discharge Orders    None       Dione Booze,  MD 10/11/19 [phone]

## 2019-10-11 NOTE — ED Triage Notes (Signed)
BIB [facility] after pt called to report rectal bleeding this morning. According to EMS, pt was dx with diverticulitis on 10/10/19 after being seen for rectal bleeding. EMS reports seeing a small amount of bright red blood in the commode. PT has hx of HTN, and pacemaker implant as x 2 weeks. Pt is on Eliquis but was told by PCP to stop taking as of 10/10/19.   B/P 166/71 O2 99 RR 21 HR 78 Temp 99

## 2019-10-11 NOTE — ED Notes (Signed)
Patient Alert and oriented to baseline. Stable and ambulatory to baseline. Patient verbalized understanding of the discharge instructions.  Patient belongings were taken by the patient.   

## 2019-10-16 ENCOUNTER — Telehealth: Payer: Self-pay | Admitting: Internal Medicine

## 2019-10-16 NOTE — Telephone Encounter (Signed)
New message:     Patient daughter calling to tell the doctor that the patient went to the Dr. Modesto Charon took the patient off of Equi's last Thursday and they went GI this morning they stopped it, because she was bleeding. Please call the patient daughter back.

## 2019-10-16 NOTE — Telephone Encounter (Signed)
Pts daughter Danielle Colon on [place] called to report that the pt was taken off of Eliquis last Thursday due to rectal bleeding by Dr. Modesto Charon that was covering for her PCP Dr. Duane Lope... she since saw Dr. Dulce Sellar GI today and he would like to do a colonoscopy on her and advised them to call Cardio and ask about what to to with her Eliquis.   I asked her if Dr. Dulce Sellar had mentioned if he thought it was safe for her to go back on it but she said he did not. I asked her to call him back and ask if he felt there was any further risk of her taking it.    Pt will be seeing Dr. Tenny Craw her PCP in the morning to follow up from the GI bleeding and will talk with him further about how to proceed with her Eliquis.   Pt says she does not know if she is ever in Afib.. she denies palpitations and any other symptoms that her heart may be out of rhythm.   Will forward to Dr. Graciela Husbands for review and Dr. Freda Jackson... her PCP.

## 2019-10-17 ENCOUNTER — Encounter: Payer: Self-pay | Admitting: Internal Medicine

## 2019-10-17 NOTE — Telephone Encounter (Signed)
Error

## 2019-10-17 NOTE — Telephone Encounter (Signed)
Danielle Colon is calling back stating Danielle Colon's PCP put her back on Eliquis taking a 1/2 a tablet twice daily. She will begin taking it again tonight and was advised to stop if she starts bleeding. She also states she is supposed to being hearing from GI to schedule a virtual coloscopy and hopes to hear from them tomorrow.

## 2019-10-28 ENCOUNTER — Other Ambulatory Visit: Payer: Self-pay | Admitting: Gastroenterology

## 2019-10-28 DIAGNOSIS — K921 Melena: Secondary | ICD-10-CM

## 2019-11-18 ENCOUNTER — Ambulatory Visit
Admission: RE | Admit: 2019-11-18 | Discharge: 2019-11-18 | Disposition: A | Discharge status: Home / Self Care | Payer: Medicare Other | Source: Ambulatory Visit | Attending: Gastroenterology | Admitting: Gastroenterology

## 2019-11-18 DIAGNOSIS — K921 Melena: Secondary | ICD-10-CM

## 2019-12-27 ENCOUNTER — Ambulatory Visit (INDEPENDENT_AMBULATORY_CARE_PROVIDER_SITE_OTHER): Payer: Medicare Other

## 2019-12-27 DIAGNOSIS — I495 Sick sinus syndrome: Secondary | ICD-10-CM | POA: Diagnosis not present

## 2019-12-28 LAB — CUP PACEART REMOTE DEVICE CHECK
Battery Remaining Longevity: 168 mo
Battery Voltage: 3.21 V
Brady Statistic AP VP Percent: 0.05 %
Brady Statistic AP VS Percent: 70.44 %
Brady Statistic AS VP Percent: 0.02 %
Brady Statistic AS VS Percent: 29.53 %
Brady Statistic RA Percent Paced: 59.84 %
Brady Statistic RV Percent Paced: 2.11 %
Date Time Interrogation Session: 20211210121844
Implantable Lead Implant Date: 20210910
Implantable Lead Implant Date: 20210910
Implantable Lead Location: 753859
Implantable Lead Location: 753860
Implantable Lead Model: 5076
Implantable Lead Model: 5076
Implantable Pulse Generator Implant Date: 20210910
Lead Channel Impedance Value: 323 Ohm
Lead Channel Impedance Value: 418 Ohm
Lead Channel Impedance Value: 475 Ohm
Lead Channel Impedance Value: 475 Ohm
Lead Channel Pacing Threshold Amplitude: 0.5 V
Lead Channel Pacing Threshold Amplitude: 0.625 V
Lead Channel Pacing Threshold Pulse Width: 0.4 ms
Lead Channel Pacing Threshold Pulse Width: 0.4 ms
Lead Channel Sensing Intrinsic Amplitude: 1.125 mV
Lead Channel Sensing Intrinsic Amplitude: 1.125 mV
Lead Channel Sensing Intrinsic Amplitude: 4.5 mV
Lead Channel Sensing Intrinsic Amplitude: 4.5 mV
Lead Channel Setting Pacing Amplitude: 1.5 V
Lead Channel Setting Pacing Amplitude: 2.5 V
Lead Channel Setting Pacing Pulse Width: 0.4 ms
Lead Channel Setting Sensing Sensitivity: 1.2 mV

## 2020-01-05 DIAGNOSIS — I4892 Unspecified atrial flutter: Secondary | ICD-10-CM | POA: Insufficient documentation

## 2020-01-05 DIAGNOSIS — Z950 Presence of cardiac pacemaker: Secondary | ICD-10-CM | POA: Insufficient documentation

## 2020-01-08 ENCOUNTER — Ambulatory Visit: Payer: Medicare Other | Admitting: Internal Medicine

## 2020-01-08 ENCOUNTER — Encounter: Payer: Self-pay | Admitting: Internal Medicine

## 2020-01-08 ENCOUNTER — Other Ambulatory Visit: Payer: Self-pay

## 2020-01-08 VITALS — BP 142/70 | HR 88 | Ht 60.0 in | Wt 154.0 lb

## 2020-01-08 DIAGNOSIS — R55 Syncope and collapse: Secondary | ICD-10-CM | POA: Diagnosis not present

## 2020-01-08 DIAGNOSIS — I4892 Unspecified atrial flutter: Secondary | ICD-10-CM | POA: Diagnosis not present

## 2020-01-08 DIAGNOSIS — Z950 Presence of cardiac pacemaker: Secondary | ICD-10-CM

## 2020-01-08 LAB — CUP PACEART INCLINIC DEVICE CHECK
Battery Remaining Longevity: 169 mo
Battery Voltage: 3.21 V
Brady Statistic AP VP Percent: 0.05 %
Brady Statistic AP VS Percent: 71.02 %
Brady Statistic AS VP Percent: 0.01 %
Brady Statistic AS VS Percent: 28.94 %
Brady Statistic RA Percent Paced: 60.6 %
Brady Statistic RV Percent Paced: 2.02 %
Date Time Interrogation Session: 20211222084600
Implantable Lead Implant Date: 20210910
Implantable Lead Implant Date: 20210910
Implantable Lead Location: 753859
Implantable Lead Location: 753860
Implantable Lead Model: 5076
Implantable Lead Model: 5076
Implantable Pulse Generator Implant Date: 20210910
Lead Channel Impedance Value: 342 Ohm
Lead Channel Impedance Value: 494 Ohm
Lead Channel Impedance Value: 513 Ohm
Lead Channel Impedance Value: 551 Ohm
Lead Channel Pacing Threshold Amplitude: 0.5 V
Lead Channel Pacing Threshold Amplitude: 0.75 V
Lead Channel Pacing Threshold Pulse Width: 0.4 ms
Lead Channel Pacing Threshold Pulse Width: 0.4 ms
Lead Channel Sensing Intrinsic Amplitude: 1.875 mV
Lead Channel Sensing Intrinsic Amplitude: 5.125 mV
Lead Channel Setting Pacing Amplitude: 1.5 V
Lead Channel Setting Pacing Amplitude: 2.5 V
Lead Channel Setting Pacing Pulse Width: 0.4 ms
Lead Channel Setting Sensing Sensitivity: 1.2 mV

## 2020-01-08 MED ORDER — DILTIAZEM HCL ER COATED BEADS 180 MG PO CP24: 180.0000 mg | ORAL_CAPSULE | Freq: Every day | ORAL | 3 refills | Status: DC

## 2020-01-08 NOTE — Progress Notes (Signed)
Patient Care Team: Daisy Floro, MD as PCP - General (Family Medicine) Debbe Odea, MD as PCP - Cardiology (Cardiology)   HPI  Danielle Colon is a 86 y.o. female Seen in follow-up from hospital presenting with syncope, found to be in atrial fibrillation and sinus bradycardia. She underwent pacing-Medtronic 9/21.Marland Kitchen    Anticoagulated with apixaban @ hosp, converted spontaneously   no bleeding  No chest pain.  Some palpitations associated with dyspnea.  Some edema.  Better since pacing    Date Cr K Hgb  7/21 1.15 3.7 13.         DATE TEST EF   7/15 Echo   60-65 %   7/21 Echo   60 %         Thromboembolic risk factors ( age  86, HTN-1, TIA/CVA-2? , Gender-1) for a CHADSVASc Score of >=4+   Records and Results Reviewed   Past Medical History:  Diagnosis Date  . Bell's palsy 2009;    on the right  . Brain TIA    pt denies this hx on 07/18/2013; "they thought it was a stroke but dr said later that it was Bell's Palsy"  . Cerebrovascular disease   . GERD (gastroesophageal reflux disease)   . Hepatitis    "don't remember what kind; had it ~ 3 times when I was younger; last time was in the 1990's"  . Hyperlipidemia   . Hypertension   . Pancreatitis     Past Surgical History:  Procedure Laterality Date  . APPENDECTOMY  ~ 1971  . CATARACT EXTRACTION Right   . CHOLECYSTECTOMY  ~ 1971  . PACEMAKER IMPLANT N/A 09/27/2019   Procedure: PACEMAKER IMPLANT;  Surgeon: Duke Salvia, MD;  Location: [facility] INVASIVE CV LAB;  Service: Cardiovascular;  Laterality: N/A;  . TONSILLECTOMY  1949    Current Meds  Medication Sig  . acetaminophen (TYLENOL) 325 MG tablet Take 2 tablets (650 mg total) by mouth every 6 (six) hours as needed for mild pain (or Fever >/= 101).  Marland Kitchen apixaban (ELIQUIS) 5 MG TABS tablet Take 1 tablet (5 mg total) by mouth 2 (two) times daily.  Marland Kitchen ascorbic acid (VITAMIN C) 500 MG tablet Take 500 mg by mouth daily.  . cholecalciferol (VITAMIN D3) 25  MCG (1000 UNIT) tablet Take 1,000 Units by mouth at bedtime.   Marland Kitchen losartan-hydrochlorothiazide (HYZAAR) 100-25 MG tablet Take 1 tablet by mouth daily.  . Omega-3 Fatty Acids (FISH OIL) 1200 MG CAPS Take 1 capsule by mouth daily.    Allergies  Allergen Reactions  . Tessalon Perles [Benzonatate] Other (See Comments)    unknown  . Neurontin [Gabapentin] Other (See Comments)    unknown  . Zantac [Ranitidine Hcl] Other (See Comments)    GI upset      Review of Systems negative except from HPI and PMH  Physical Exam BP (!) 142/70   Pulse 88   Ht 5' (1.524 m)   Wt 154 lb (69.9 kg)   SpO2 97%   BMI 30.08 kg/m  Well developed and nourished in no acute distress HENT normal Neck supple with JVP-  flat   Clear Device pocket well healed; without hematoma or erythema.  There is no tethering  Regular rate and rhythm, no murmurs or gallops Abd-soft with active BS No Clubbing cyanosis tr edema Skin-warm and dry A & Oriented  Grossly normal sensory and motor function  ECG sinus @ 88 25/08/36  CrCl cannot be calculated (Patient's most recent lab result is older than the maximum 21 days allowed.).   Assessment and  Plan  Atrial flutter RVR  14%  CVR  Sinus Brady  Syncope  HTN  Pacemaker Medtronic   Patient continues to have episodes of atrial fibrillation associated with palpitations rapid rates and dyspnea.  At this juncture would try to augment rate control.  We will increase her diltiazem from 120--180 mg daily.  On Anticoagulation;  No bleeding issues   No interval syncope    Current medicines are reviewed at length with the patient today .  The patient does not  have concerns regarding medicines.

## 2020-01-08 NOTE — Patient Instructions (Signed)
Medication Instructions:   ** Stop taking Diltiazen 120mg  1 tablet by mouth daily.  **  Begin taking Diltiazem 180mg  1 tablet by mouth daily.  *If you need a refill on your cardiac medications before your next appointment, please call your pharmacy*   Lab Work: None ordered.  If you have labs (blood work) drawn today and your tests are completely normal, you will receive your results only by: MyChart Message (if you have MyChart) OR . A paper copy in the mail If you have any lab test that is abnormal or we need to change your treatment, we will call you to review the results.   Testing/Procedures: None ordered.    Follow-Up: At [facility], you and your health needs are our priority.  As part of our continuing mission to provide you with exceptional heart care, we have created designated Provider Care Teams.  These Care Teams include your primary Cardiologist (physician) and Advanced Practice Providers (APPs -  Physician Assistants and Nurse Practitioners) who all work together to provide you with the care you need, when you need it.  We recommend signing up for the patient portal called "MyChart".  Sign up information is provided on this After Visit Summary.  MyChart is used to connect with patients for Virtual Visits (Telemedicine).  Patients are able to view lab/test results, encounter notes, upcoming appointments, etc.  Non-urgent messages can be sent to your provider as well.   To learn more about what you can do with MyChart, go to Marland Kitchen.    Your next appointment:   3 month(s)  The format for your next appointment:   In Person  Provider:   Dr [facility]

## 2020-01-09 NOTE — Progress Notes (Signed)
Remote pacemaker transmission.   

## 2020-01-31 DIAGNOSIS — D6869 Other thrombophilia: Secondary | ICD-10-CM | POA: Diagnosis not present

## 2020-01-31 DIAGNOSIS — I10 Essential (primary) hypertension: Secondary | ICD-10-CM | POA: Diagnosis not present

## 2020-01-31 DIAGNOSIS — R5381 Other malaise: Secondary | ICD-10-CM | POA: Diagnosis not present

## 2020-01-31 DIAGNOSIS — I480 Paroxysmal atrial fibrillation: Secondary | ICD-10-CM | POA: Diagnosis not present

## 2020-01-31 DIAGNOSIS — K219 Gastro-esophageal reflux disease without esophagitis: Secondary | ICD-10-CM | POA: Diagnosis not present

## 2020-01-31 DIAGNOSIS — M19049 Primary osteoarthritis, unspecified hand: Secondary | ICD-10-CM | POA: Diagnosis not present

## 2020-01-31 DIAGNOSIS — E7800 Pure hypercholesterolemia, unspecified: Secondary | ICD-10-CM | POA: Diagnosis not present

## 2020-01-31 DIAGNOSIS — Z8719 Personal history of other diseases of the digestive system: Secondary | ICD-10-CM | POA: Diagnosis not present

## 2020-02-06 DIAGNOSIS — I10 Essential (primary) hypertension: Secondary | ICD-10-CM | POA: Diagnosis not present

## 2020-02-06 DIAGNOSIS — M19049 Primary osteoarthritis, unspecified hand: Secondary | ICD-10-CM | POA: Diagnosis not present

## 2020-02-06 DIAGNOSIS — K219 Gastro-esophageal reflux disease without esophagitis: Secondary | ICD-10-CM | POA: Diagnosis not present

## 2020-02-06 DIAGNOSIS — I480 Paroxysmal atrial fibrillation: Secondary | ICD-10-CM | POA: Diagnosis not present

## 2020-02-06 DIAGNOSIS — N183 Chronic kidney disease, stage 3 unspecified: Secondary | ICD-10-CM | POA: Diagnosis not present

## 2020-02-06 DIAGNOSIS — E7800 Pure hypercholesterolemia, unspecified: Secondary | ICD-10-CM | POA: Diagnosis not present

## 2020-03-25 DIAGNOSIS — H02834 Dermatochalasis of left upper eyelid: Secondary | ICD-10-CM | POA: Diagnosis not present

## 2020-03-25 DIAGNOSIS — H04123 Dry eye syndrome of bilateral lacrimal glands: Secondary | ICD-10-CM | POA: Diagnosis not present

## 2020-03-25 DIAGNOSIS — H26491 Other secondary cataract, right eye: Secondary | ICD-10-CM | POA: Diagnosis not present

## 2020-03-25 DIAGNOSIS — H02831 Dermatochalasis of right upper eyelid: Secondary | ICD-10-CM | POA: Diagnosis not present

## 2020-03-27 ENCOUNTER — Ambulatory Visit (INDEPENDENT_AMBULATORY_CARE_PROVIDER_SITE_OTHER): Payer: Medicare Other

## 2020-03-27 DIAGNOSIS — I495 Sick sinus syndrome: Secondary | ICD-10-CM | POA: Diagnosis not present

## 2020-03-30 LAB — CUP PACEART REMOTE DEVICE CHECK
Battery Remaining Longevity: 166 mo
Battery Voltage: 3.19 V
Brady Statistic AP VP Percent: 0.04 %
Brady Statistic AP VS Percent: 61.35 %
Brady Statistic AS VP Percent: 0.01 %
Brady Statistic AS VS Percent: 38.62 %
Brady Statistic RA Percent Paced: 51.43 %
Brady Statistic RV Percent Paced: 2.68 %
Date Time Interrogation Session: 20220310215329
Implantable Lead Implant Date: 20210910
Implantable Lead Implant Date: 20210910
Implantable Lead Location: 753859
Implantable Lead Location: 753860
Implantable Lead Model: 5076
Implantable Lead Model: 5076
Implantable Pulse Generator Implant Date: 20210910
Lead Channel Impedance Value: 304 Ohm
Lead Channel Impedance Value: 456 Ohm
Lead Channel Impedance Value: 475 Ohm
Lead Channel Impedance Value: 513 Ohm
Lead Channel Pacing Threshold Amplitude: 0.5 V
Lead Channel Pacing Threshold Amplitude: 0.625 V
Lead Channel Pacing Threshold Pulse Width: 0.4 ms
Lead Channel Pacing Threshold Pulse Width: 0.4 ms
Lead Channel Sensing Intrinsic Amplitude: 0.375 mV
Lead Channel Sensing Intrinsic Amplitude: 0.375 mV
Lead Channel Sensing Intrinsic Amplitude: 5 mV
Lead Channel Sensing Intrinsic Amplitude: 5 mV
Lead Channel Setting Pacing Amplitude: 1.5 V
Lead Channel Setting Pacing Amplitude: 2.5 V
Lead Channel Setting Pacing Pulse Width: 0.4 ms
Lead Channel Setting Sensing Sensitivity: 1.2 mV

## 2020-03-31 DIAGNOSIS — R001 Bradycardia, unspecified: Secondary | ICD-10-CM | POA: Insufficient documentation

## 2020-04-03 NOTE — Progress Notes (Signed)
Remote pacemaker transmission.   

## 2020-04-13 ENCOUNTER — Encounter: Payer: Medicare Other | Admitting: Internal Medicine

## 2020-04-13 DIAGNOSIS — R55 Syncope and collapse: Secondary | ICD-10-CM

## 2020-04-13 DIAGNOSIS — R001 Bradycardia, unspecified: Secondary | ICD-10-CM

## 2020-04-13 DIAGNOSIS — Z950 Presence of cardiac pacemaker: Secondary | ICD-10-CM

## 2020-04-13 DIAGNOSIS — I4892 Unspecified atrial flutter: Secondary | ICD-10-CM

## 2020-04-15 ENCOUNTER — Telehealth: Payer: Self-pay

## 2020-04-15 NOTE — Telephone Encounter (Signed)
Called patient to assess. Patient reports she has felt well overall. States she has experienced some fatigue if she over does it. Denies shortness of breath or palpitations. Advised patient of apt. On 07/07/20 @ 3:30pm. Advised patient to call with new or worsening symptoms.

## 2020-04-15 NOTE — Telephone Encounter (Signed)
-----   Message from Duke Salvia, MD sent at 04/15/2020 10:16 AM EDT ----- Device remote reviewed. Remote is normal.  Battery status is good.  Lead measurements unchanged.  Histograms are appropriate.  Afib present   Scheduled for folloup in about 10 weeks   Can you guys ask her if there afib is bothering her ?

## 2020-04-16 DIAGNOSIS — I10 Essential (primary) hypertension: Secondary | ICD-10-CM | POA: Diagnosis not present

## 2020-04-16 DIAGNOSIS — M19049 Primary osteoarthritis, unspecified hand: Secondary | ICD-10-CM | POA: Diagnosis not present

## 2020-04-16 DIAGNOSIS — K219 Gastro-esophageal reflux disease without esophagitis: Secondary | ICD-10-CM | POA: Diagnosis not present

## 2020-04-16 DIAGNOSIS — N183 Chronic kidney disease, stage 3 unspecified: Secondary | ICD-10-CM | POA: Diagnosis not present

## 2020-04-16 DIAGNOSIS — I480 Paroxysmal atrial fibrillation: Secondary | ICD-10-CM | POA: Diagnosis not present

## 2020-04-16 DIAGNOSIS — E7800 Pure hypercholesterolemia, unspecified: Secondary | ICD-10-CM | POA: Diagnosis not present

## 2020-05-22 ENCOUNTER — Observation Stay (HOSPITAL_BASED_OUTPATIENT_CLINIC_OR_DEPARTMENT_OTHER)
Admission: EM | Admit: 2020-05-22 | Discharge: 2020-05-25 | Disposition: A | Discharge status: Home / Self Care | Payer: Medicare Other | Attending: Internal Medicine | Admitting: Internal Medicine

## 2020-05-22 ENCOUNTER — Encounter (HOSPITAL_BASED_OUTPATIENT_CLINIC_OR_DEPARTMENT_OTHER): Payer: Self-pay

## 2020-05-22 ENCOUNTER — Emergency Department (HOSPITAL_BASED_OUTPATIENT_CLINIC_OR_DEPARTMENT_OTHER): Payer: Medicare Other

## 2020-05-22 ENCOUNTER — Other Ambulatory Visit: Payer: Self-pay

## 2020-05-22 DIAGNOSIS — K8592 Acute pancreatitis with infected necrosis, unspecified: Secondary | ICD-10-CM | POA: Diagnosis not present

## 2020-05-22 DIAGNOSIS — Z950 Presence of cardiac pacemaker: Secondary | ICD-10-CM | POA: Diagnosis not present

## 2020-05-22 DIAGNOSIS — I110 Hypertensive heart disease with heart failure: Secondary | ICD-10-CM | POA: Diagnosis not present

## 2020-05-22 DIAGNOSIS — I4891 Unspecified atrial fibrillation: Secondary | ICD-10-CM | POA: Diagnosis not present

## 2020-05-22 DIAGNOSIS — E785 Hyperlipidemia, unspecified: Secondary | ICD-10-CM | POA: Diagnosis not present

## 2020-05-22 DIAGNOSIS — Z8673 Personal history of transient ischemic attack (TIA), and cerebral infarction without residual deficits: Secondary | ICD-10-CM | POA: Insufficient documentation

## 2020-05-22 DIAGNOSIS — R531 Weakness: Secondary | ICD-10-CM

## 2020-05-22 DIAGNOSIS — I5032 Chronic diastolic (congestive) heart failure: Secondary | ICD-10-CM | POA: Insufficient documentation

## 2020-05-22 DIAGNOSIS — K859 Acute pancreatitis without necrosis or infection, unspecified: Secondary | ICD-10-CM | POA: Diagnosis not present

## 2020-05-22 DIAGNOSIS — K219 Gastro-esophageal reflux disease without esophagitis: Secondary | ICD-10-CM | POA: Diagnosis not present

## 2020-05-22 DIAGNOSIS — I10 Essential (primary) hypertension: Secondary | ICD-10-CM | POA: Diagnosis present

## 2020-05-22 DIAGNOSIS — Z20822 Contact with and (suspected) exposure to covid-19: Secondary | ICD-10-CM | POA: Diagnosis not present

## 2020-05-22 DIAGNOSIS — Z79899 Other long term (current) drug therapy: Secondary | ICD-10-CM | POA: Insufficient documentation

## 2020-05-22 DIAGNOSIS — Z7901 Long term (current) use of anticoagulants: Secondary | ICD-10-CM | POA: Insufficient documentation

## 2020-05-22 DIAGNOSIS — R1013 Epigastric pain: Secondary | ICD-10-CM | POA: Diagnosis present

## 2020-05-22 DIAGNOSIS — I482 Chronic atrial fibrillation, unspecified: Secondary | ICD-10-CM | POA: Diagnosis not present

## 2020-05-22 HISTORY — DX: Unspecified atrial fibrillation: I48.91

## 2020-05-22 LAB — COMPREHENSIVE METABOLIC PANEL
ALT: 11 U/L (ref 0–44)
AST: 15 U/L (ref 15–41)
Albumin: 4.4 g/dL (ref 3.5–5.0)
Alkaline Phosphatase: 92 U/L (ref 38–126)
Anion gap: 11 (ref 5–15)
BUN: 19 mg/dL (ref 8–23)
CO2: 26 mmol/L (ref 22–32)
Calcium: 10.4 mg/dL — ABNORMAL HIGH (ref 8.9–10.3)
Chloride: 102 mmol/L (ref 98–111)
Creatinine, Ser: 1.13 mg/dL — ABNORMAL HIGH (ref 0.44–1.00)
GFR, Estimated: 47 mL/min — ABNORMAL LOW (ref >60)
Glucose, Bld: 139 mg/dL — ABNORMAL HIGH (ref 70–99)
Potassium: 3.7 mmol/L (ref 3.5–5.1)
Sodium: 139 mmol/L (ref 135–145)
Total Bilirubin: 0.5 mg/dL (ref 0.3–1.2)
Total Protein: 7.8 g/dL (ref 6.5–8.1)

## 2020-05-22 LAB — CBC WITH DIFFERENTIAL/PLATELET
Abs Immature Granulocytes: 0.02 10*3/uL (ref 0.00–0.07)
Basophils Absolute: 0 10*3/uL (ref 0.0–0.1)
Basophils Relative: 0 %
Eosinophils Absolute: 0.1 10*3/uL (ref 0.0–0.5)
Eosinophils Relative: 1 %
HCT: 45.6 % (ref 36.0–46.0)
Hemoglobin: 15.2 g/dL — ABNORMAL HIGH (ref 12.0–15.0)
Immature Granulocytes: 0 %
Lymphocytes Relative: 22 %
Lymphs Abs: 1.8 10*3/uL (ref 0.7–4.0)
MCH: 30.1 pg (ref 26.0–34.0)
MCHC: 33.3 g/dL (ref 30.0–36.0)
MCV: 90.3 fL (ref 80.0–100.0)
Monocytes Absolute: 0.6 10*3/uL (ref 0.1–1.0)
Monocytes Relative: 7 %
Neutro Abs: 5.9 10*3/uL (ref 1.7–7.7)
Neutrophils Relative %: 70 %
Platelets: 180 10*3/uL (ref 150–400)
RBC: 5.05 MIL/uL (ref 3.87–5.11)
RDW: 13 % (ref 11.5–15.5)
WBC: 8.5 10*3/uL (ref 4.0–10.5)
nRBC: 0 % (ref 0.0–0.2)

## 2020-05-22 LAB — RESP PANEL BY RT-PCR (FLU A&B, COVID) ARPGX2
Influenza A by PCR: NEGATIVE
Influenza B by PCR: NEGATIVE
SARS Coronavirus 2 by RT PCR: NEGATIVE

## 2020-05-22 LAB — LIPASE, BLOOD: Lipase: 3284 U/L — ABNORMAL HIGH (ref 11–51)

## 2020-05-22 MED ORDER — PANCRELIPASE (LIP-PROT-AMYL) 12000-38000 UNITS PO CPEP
12000.0000 [IU] | ORAL_CAPSULE | Freq: Three times a day (TID) | ORAL | Status: DC
  Administered 2020-05-22 – 2020-05-25 (×9): 12000 [IU] via ORAL
  Filled 2020-05-22 (×9): qty 1

## 2020-05-22 MED ORDER — APIXABAN 5 MG PO TABS
5.0000 mg | ORAL_TABLET | Freq: Two times a day (BID) | ORAL | Status: DC
  Filled 2020-05-22: qty 1

## 2020-05-22 MED ORDER — OXYCODONE HCL 5 MG PO TABS: 5.0000 mg | ORAL_TABLET | ORAL | Status: DC | PRN

## 2020-05-22 MED ORDER — ACETAMINOPHEN 325 MG PO TABS
650.0000 mg | ORAL_TABLET | Freq: Four times a day (QID) | ORAL | Status: DC | PRN
  Administered 2020-05-22 – 2020-05-25 (×4): 650 mg via ORAL
  Filled 2020-05-22 (×4): qty 2

## 2020-05-22 MED ORDER — IOHEXOL 300 MG/ML  SOLN
100.0000 mL | Freq: Once | INTRAMUSCULAR | Status: AC | PRN
  Administered 2020-05-22: 100 mL via INTRAVENOUS

## 2020-05-22 MED ORDER — ONDANSETRON HCL 4 MG/2ML IJ SOLN: 4.0000 mg | Freq: Four times a day (QID) | INTRAMUSCULAR | Status: DC | PRN

## 2020-05-22 MED ORDER — ASCORBIC ACID 500 MG PO TABS
500.0000 mg | ORAL_TABLET | Freq: Every day | ORAL | Status: DC
  Administered 2020-05-22 – 2020-05-25 (×4): 500 mg via ORAL
  Filled 2020-05-22 (×4): qty 1

## 2020-05-22 MED ORDER — FENTANYL CITRATE (PF) 100 MCG/2ML IJ SOLN: 50.0000 ug | INTRAMUSCULAR | Status: DC | PRN

## 2020-05-22 MED ORDER — FENTANYL CITRATE (PF) 100 MCG/2ML IJ SOLN
50.0000 ug | Freq: Once | INTRAMUSCULAR | Status: AC | PRN
  Administered 2020-05-22: 50 ug via INTRAVENOUS

## 2020-05-22 MED ORDER — OMEGA-3-ACID ETHYL ESTERS 1 G PO CAPS
1.0000 g | ORAL_CAPSULE | Freq: Every day | ORAL | Status: DC
  Administered 2020-05-22 – 2020-05-25 (×4): 1 g via ORAL
  Filled 2020-05-22 (×4): qty 1

## 2020-05-22 MED ORDER — LACTATED RINGERS IV SOLN: INTRAVENOUS | Status: DC

## 2020-05-22 MED ORDER — FENTANYL CITRATE (PF) 100 MCG/2ML IJ SOLN: 100.0000 ug | Freq: Once | INTRAMUSCULAR | Status: DC

## 2020-05-22 MED ORDER — FENTANYL CITRATE (PF) 100 MCG/2ML IJ SOLN
50.0000 ug | INTRAMUSCULAR | Status: DC | PRN
  Administered 2020-05-22: 50 ug via INTRAVENOUS

## 2020-05-22 MED ORDER — PANTOPRAZOLE SODIUM 40 MG PO TBEC
40.0000 mg | DELAYED_RELEASE_TABLET | Freq: Every day | ORAL | Status: DC
  Administered 2020-05-22 – 2020-05-25 (×4): 40 mg via ORAL
  Filled 2020-05-22 (×4): qty 1

## 2020-05-22 MED ORDER — ONDANSETRON HCL 4 MG/2ML IJ SOLN
4.0000 mg | Freq: Once | INTRAMUSCULAR | Status: AC
  Administered 2020-05-22: 4 mg via INTRAVENOUS
  Filled 2020-05-22: qty 2

## 2020-05-22 MED ORDER — ONDANSETRON HCL 4 MG PO TABS
4.0000 mg | ORAL_TABLET | Freq: Four times a day (QID) | ORAL | Status: DC | PRN
Start: 2020-05-22 — End: 2020-05-25

## 2020-05-22 MED ORDER — SODIUM CHLORIDE 0.9 % IV SOLN: Freq: Once | INTRAVENOUS | Status: AC

## 2020-05-22 MED ORDER — DILTIAZEM HCL ER COATED BEADS 180 MG PO CP24
180.0000 mg | ORAL_CAPSULE | Freq: Every day | ORAL | Status: DC
  Administered 2020-05-22 – 2020-05-25 (×4): 180 mg via ORAL
  Filled 2020-05-22 (×4): qty 1

## 2020-05-22 MED ORDER — HYDRALAZINE HCL 20 MG/ML IJ SOLN: 10.0000 mg | Freq: Four times a day (QID) | INTRAMUSCULAR | Status: DC | PRN

## 2020-05-22 MED ORDER — APIXABAN 2.5 MG PO TABS
2.5000 mg | ORAL_TABLET | Freq: Two times a day (BID) | ORAL | Status: DC
  Administered 2020-05-22 – 2020-05-25 (×6): 2.5 mg via ORAL
  Filled 2020-05-22 (×7): qty 1

## 2020-05-22 MED ORDER — FENTANYL CITRATE (PF) 100 MCG/2ML IJ SOLN
50.0000 ug | INTRAMUSCULAR | Status: DC | PRN
  Administered 2020-05-22: 50 ug via INTRAVENOUS
  Filled 2020-05-22 (×3): qty 2

## 2020-05-22 MED ORDER — VITAMIN D 25 MCG (1000 UNIT) PO TABS
1000.0000 [IU] | ORAL_TABLET | Freq: Every day | ORAL | Status: DC
  Administered 2020-05-22 – 2020-05-24 (×3): 1000 [IU] via ORAL
  Filled 2020-05-22 (×3): qty 1

## 2020-05-22 NOTE — Progress Notes (Signed)
Patient arrived to room 1520 by [facility] from [facility]. Patient is oriented to room. Paged the admitting team to assign an attending. Patient is in bed, no complaints, telemetry confirmed, will continue to monitor.

## 2020-05-22 NOTE — ED Provider Notes (Signed)
Signout from Dr. Read Drivers.  87 year old female with history of pancreatitis complaining of midepigastric abdominal pain started a few hours ago.  1 episode of nausea and vomiting.  No fevers.  She is diffusely tender on exam.  Labs showing elevated lipase.  No fever or white count elevations.  She said she has had this before and it is related to diet.  Denies any alcohol.  Signout plan is to follow-up on CT abdomen and pelvis and admit to the hospital for further management. Physical Exam  BP (!) 147/75 (BP Location: Left Arm)   Pulse 87   Temp 98.2 F (36.8 C) (Oral)   Resp (!) 21   Ht 5' (1.524 m)   Wt 70 kg   SpO2 94%   BMI 30.14 kg/m   Physical Exam  ED Course/Procedures     Procedures  MDM  CT showing pancreatic inflammation.  Patient reports her pain is coming back so ordered more pain medication.  Have paged hospitalist for admission.  8:50 AM.  Discussed with Dr. Revonda Humphrey [facility] hospitalist who will put the patient in for a bed      Terrilee Files, MD 05/22/20 [phone]

## 2020-05-22 NOTE — H&P (Signed)
History and Physical    DOA: 05/22/2020  PCP: Daisy Floro, MD  Patient coming from: home  Chief Complaint: abdominal pain  HPI: Danielle Colon is a 87 y.o. female with history h/o A fib on anticoagulation, s/p pacemaker, HTN, HLP, grade 3 diastolic dysfunction on last Echo,  idiopathic pancreatitis, s/p cholecystectomy in the past presents with c/o abdominal pain--10/10, epigastric, sometimes radiating to the back, associated with nausea/vomiting.  Patient states she woke up with abdominal pain around 4 AM and had some water to drink but vomited later.  Pain currently improved to 3/10 after getting analgesics at outside ED.  She also received 2 L IV fluids in the ED.  She denies any history of CHF/pulmonary edema although reports chronic right lower leg/ankle swelling ever since she had a mole removed few years back. Patient hospitalized last year for acute pancreatitis of unclear etiology, she underwent cholecystectomy many years back for gallstones.  Her home medication list shows Hyzaar as well as Cozaar->patient states she is not taking either of those medications. She does report taking Cardizem, Eliquis and Creon with meals. ED course: Blood pressure elevated at systolic 170s to 707E.  Heart rate 90-108, O2 sat 94 to 100% on room air.  CBC WNL, BMP showed creatinine 1.13 and calcium 10.4, lipase 3200, CT abdomen/pelvis showed peripancreatic edema, probable pancreatic divisum, postcholecystectomy status.  Patient received IV fentanyl and IV fluids before transferring to [facility]  Review of Systems: As per HPI, otherwise review of systems negative.    Past Medical History:  Diagnosis Date  . Atrial fibrillation (HCC)   . Bell's palsy 2009;    on the right  . Brain TIA    pt denies this hx on 07/18/2013; "they thought it was a stroke but dr said later that it was Bell's Palsy"  . Cerebrovascular disease   . GERD (gastroesophageal reflux disease)   . Hepatitis    "don't  remember what kind; had it ~ 3 times when I was younger; last time was in the 1990's"  . Hyperlipidemia   . Hypertension   . Pancreatitis     Past Surgical History:  Procedure Laterality Date  . APPENDECTOMY  ~ 1971  . CATARACT EXTRACTION Right   . CHOLECYSTECTOMY  ~ 1971  . PACEMAKER IMPLANT N/A 09/27/2019   Procedure: PACEMAKER IMPLANT;  Surgeon: Duke Salvia, MD;  Location: [facility] INVASIVE CV LAB;  Service: Cardiovascular;  Laterality: N/A;  . TONSILLECTOMY  1949    Social history:  reports that she has never smoked. She has never used smokeless tobacco. She reports that she does not drink alcohol and does not use drugs.   Allergies  Allergen Reactions  . Tessalon Perles [Benzonatate] Other (See Comments)    unknown  . Neurontin [Gabapentin] Other (See Comments)    unknown  . Zantac [Ranitidine Hcl] Other (See Comments)    GI upset    Family History  Problem Relation Age of Onset  . Pneumonia Mother        old age  . Heart attack Father   . Diabetes Father   . Diabetes Sister   . Diabetes Brother   . Cancer Sister   . Diabetes Sister   . Diabetes Sister       Prior to Admission medications   Medication Sig Start Date End Date Taking? Authorizing Provider  acetaminophen (TYLENOL) 325 MG tablet Take 2 tablets (650 mg total) by mouth every 6 (six) hours as needed for  mild pain (or Fever >/= 101). 03/05/19   Myrlene Broker, MD  apixaban (ELIQUIS) 5 MG TABS tablet Take 1 tablet (5 mg total) by mouth 2 (two) times daily. 07/23/19   Glade Lloyd, MD  ascorbic acid (VITAMIN C) 500 MG tablet Take 500 mg by mouth daily.    [provider]  cholecalciferol (VITAMIN D3) 25 MCG (1000 UNIT) tablet Take 1,000 Units by mouth at bedtime.     [provider]  diltiazem (CARDIZEM CD) 180 MG 24 hr capsule Take 1 capsule (180 mg total) by mouth daily. 01/08/20 04/07/20  Duke Salvia, MD  losartan-hydrochlorothiazide (HYZAAR) 100-25 MG tablet Take 1 tablet by  mouth daily. 10/10/19   [provider]  Omega-3 Fatty Acids (FISH OIL) 1200 MG CAPS Take 1 capsule by mouth daily.    [provider]  losartan (COZAAR) 50 MG tablet Take 1 tablet (50 mg total) by mouth daily. 07/24/19 10/11/19  Glade Lloyd, MD    Physical Exam: Vitals:   05/22/20 0900 05/22/20 0915 05/22/20 1115 05/22/20 1230  BP: (!) 146/103 (!) 180/84 (!) 152/98 (!) 150/102  Pulse: (!) 102 97 (!) 108 93  Resp: 18 (!) 21 20 20   Temp:   98.1 F (36.7 C) 98.3 F (36.8 C)  TempSrc:   Oral Oral  SpO2: 97% 98% 98% 99%  Weight:      Height:        Constitutional: NAD, calm, comfortable Eyes: PERRL, lids and conjunctivae normal ENMT: Mucous membranes are moist. Posterior pharynx clear of any exudate or lesions.Normal dentition.  Neck: normal, supple, no masses, no thyromegaly Respiratory: clear to auscultation bilaterally, no wheezing, no crackles. Normal respiratory effort. No accessory muscle use.  Cardiovascular: Regular rate and rhythm, no murmurs / rubs / gallops. No lower extremity pitting edema. 2+ pedal pulses.  S/p pacemaker on left anterior chest wall Abdomen: Epigastric tenderness with minimal palpation but no guarding or rebound, no masses palpated. No hepatosplenomegaly. Bowel sounds positive.  Musculoskeletal: no clubbing / cyanosis. No joint deformity upper and lower extremities. Good ROM, no contractures. Normal muscle tone.  Neurologic: CN 2-12 grossly intact. Sensation intact, DTR normal. Strength 5/5 in all 4.  Psychiatric: Normal judgment and insight. Alert and oriented x 3. Normal mood.  SKIN/catheters: no rashes, lesions, ulcers. No induration  Labs on Admission: I have personally reviewed following labs and imaging studies  CBC: Recent Labs  Lab 05/22/20 0600  WBC 8.5  NEUTROABS 5.9  HGB 15.2*  HCT 45.6  MCV 90.3  PLT 180   Basic Metabolic Panel: Recent Labs  Lab 05/22/20 0600  NA 139  K 3.7  CL 102  CO2 26  GLUCOSE 139*  BUN  19  CREATININE 1.13*  CALCIUM 10.4*   GFR: Estimated Creatinine Clearance: 30 mL/min (A) (by C-G formula based on SCr of 1.13 mg/dL (H)). Recent Labs  Lab 05/22/20 0600  WBC 8.5   Liver Function Tests: Recent Labs  Lab 05/22/20 0600  AST 15  ALT 11  ALKPHOS 92  BILITOT 0.5  PROT 7.8  ALBUMIN 4.4   Recent Labs  Lab 05/22/20 0600  LIPASE 3,284*   No results for input(s): AMMONIA in the last 168 hours. Coagulation Profile: No results for input(s): INR, PROTIME in the last 168 hours. Cardiac Enzymes: No results for input(s): CKTOTAL, CKMB, CKMBINDEX, TROPONINI in the last 168 hours. BNP (last 3 results) No results for input(s): PROBNP in the last 8760 hours. HbA1C: No results for input(s):  HGBA1C in the last 72 hours. CBG: No results for input(s): GLUCAP in the last 168 hours. Lipid Profile: No results for input(s): CHOL, HDL, LDLCALC, TRIG, CHOLHDL, LDLDIRECT in the last 72 hours. Thyroid Function Tests: No results for input(s): TSH, T4TOTAL, FREET4, T3FREE, THYROIDAB in the last 72 hours. Anemia Panel: No results for input(s): VITAMINB12, FOLATE, FERRITIN, TIBC, IRON, RETICCTPCT in the last 72 hours. Urine analysis:    Component Value Date/Time   COLORURINE STRAW (A) 07/22/2019 1538   APPEARANCEUR CLEAR 07/22/2019 1538   LABSPEC <1.005 (L) 07/22/2019 1538   PHURINE 6.5 07/22/2019 1538   GLUCOSEU NEGATIVE 07/22/2019 1538   HGBUR NEGATIVE 07/22/2019 1538   BILIRUBINUR NEGATIVE 07/22/2019 1538   KETONESUR NEGATIVE 07/22/2019 1538   PROTEINUR NEGATIVE 07/22/2019 1538   NITRITE NEGATIVE 07/22/2019 1538   LEUKOCYTESUR SMALL (A) 07/22/2019 1538    Radiological Exams on Admission: Personally reviewed  CT ABDOMEN PELVIS W CONTRAST  Result Date: 05/22/2020 CLINICAL DATA:  Abdominal pain with pancreatitis suspected EXAM: CT ABDOMEN AND PELVIS WITH CONTRAST TECHNIQUE: Multidetector CT imaging of the abdomen and pelvis was performed using the standard protocol following  bolus administration of intravenous contrast. CONTRAST:  100mL OMNIPAQUE IOHEXOL 300 MG/ML  SOLN COMPARISON:  02/28/2019 FINDINGS: Lower chest: Trace right pleural effusion. Atelectasis in the lower lungs. Moderate sliding hiatal hernia. Hepatobiliary: No focal liver abnormality.Cholecystectomy. No bile duct dilatation or visible calculus. Pancreas: Peripancreatic edema generalized pancreatic expansion and septal edema. No collection or necrosis. Probable pancreas divisum. Spleen: Unremarkable. Adrenals/Urinary Tract: Negative adrenals. No hydronephrosis or stone. Unremarkable bladder. Stomach/Bowel: No obstruction. No primary bowel inflammation is suspected. Duodenal diverticula as noted previously. Small hiatal hernia. Vascular/Lymphatic: No acute vascular abnormality. No mass or adenopathy. Reproductive:Subcentimeter uterine fibroid. Other: No ascites or pneumoperitoneum. Musculoskeletal: No acute abnormalities. IMPRESSION: 1. Acute edematous pancreatitis.  No collection. 2. Suspect pancreas divisum. Electronically Signed   By: Marnee SpringJonathon  Watts M.D.   On: 05/22/2020 07:52    EKG: Independently reviewed.  Atrial fibrillation, LAFB, PVCs.  QTC 439 ms     Assessment and Plan:   Principal Problem:   Acute pancreatitis Active Problems:   Benign hypertension   GERD (gastroesophageal reflux disease)   Hyperlipidemia   Atrial fibrillation (HCC)   Pacemaker    1.  Acute on chronic pancreatitis: Patient reports taking Creon with meals.  This is her second episode/flare requiring hospitalization.  She states usually her abdominal pain is exacerbated by meal changes and yesterday she had fried fish sandwich.  Unclear if she is taking HCTZ but would avoid this medication in the setting of idiopathic pancreatitis.  No evidence of pancreatic duct stones or CBD stones on CT.  She is status postcholecystectomy.  She does have pancreatic divisum per CT. Will admit with IV fluids (LR at 75 mill per hour-lower  rate and concern for stage III diastolic dysfunction on last echo), CLD and pain management  2.  Chronic atrial fibrillation: S/p pacemaker and is on chronic anticoagulation with Eliquis.  Resume Cardizem and anticoagulation.  Heart rate currently controlled.  3.  Hypertension: Resume Cardizem.  Patient denies being on Cozaar or Hyzaar.  Elevated BP on presentation could be secondary to pain.  Continue to monitor and can resume Cozaar if persistently elevated.  Would avoid HCTZ.  4.  Hyperlipidemia: Resume fish oil and recommended to avoid fatty meals.  5.  GERD: PPI  6.  Mild hypercalcemia: Could be related to dehydration, repeat level in AM.  Patient on vitamin D supplementation.  DVT prophylaxis: On anticoagulation  COVID screen: Negative  Code Status: Full code as confirmed with patient but does not want to be left on life support for long periods of time   .Health care proxy would be daughter, [name]  Patient/Family Communication: Discussed with patient and all questions answered to satisfaction.  Consults called: None Admission status : Patient transferred from [facility] DB and has observation admit order from accepting physician--monitor clinical response, may need to upgrade to inpatient status if unable to tolerate diet and needs prolonged IV hydration beyond observation time.Alessandra Bevels MD [facility] Pager in [place]  If 7PM-7AM, please contact night-coverage [email or web address]   05/22/2020, 1:39 PM

## 2020-05-22 NOTE — ED Triage Notes (Signed)
Patient here POV from Home with ABD Pain.  Pain began approximately 1 Hr PTA. Patient states she has a Hx of Pancreatitis with the last episode occurring February of 2021.   Patient had 1 Episode of N/V. No Diarrhea.  Ambulatory, Patient in Obvious Discomfort.

## 2020-05-22 NOTE — Progress Notes (Signed)
87 yo F with Afib on Eliquis, PPM in 2021, HTN, hx TIA and hx pancreatitis who presented with acute onset epigastric pain vomiting.  Lipase >3000.  CT shows pancreatitis, maybe pancreas divisum.  Hemodynamically stable.  Electrolytes, renal function normal (mildly high CA, CKD iiia baseline 1.0.  HR 95.   COVID -.   To med surg bed, obs status given sudden onset hours before arrival.

## 2020-05-22 NOTE — Progress Notes (Addendum)
Patient refused Eliquis 5mg , she states the last time she took 5 mg she was noted to have a GI bleed. Patient states she will take 2.5 mg dose instead. Notified Dr. . Awaiting new orders from physician. Will continue to monitor.

## 2020-05-22 NOTE — ED Provider Notes (Signed)
[facility]-EMERGENCY DEPT Provider Note: Danielle Dell, MD, FACEP  CSN: [id number] MRN: [id number] ARRIVAL: 05/22/20 at 0539 ROOM: DB014/DB014   CHIEF COMPLAINT  Abdominal Pain   HISTORY OF PRESENT ILLNESS  05/22/20 5:50 AM Danielle Colon is a 87 y.o. female with a history of recurrent pancreatitis, last episode 02/28/2019.  Her daughter states the pancreatitis is idiopathic but often triggered by dietary indiscretion, in this case a fried fish sandwich yesterday evening.  She is here with epigastric pain that began approximately 1 hour ago.  She characterizes the pain as like previous pancreatitis is rates it as a 10 out of 10.  It is worse with movement or palpation.  She has had nausea and one episode of vomiting.  She has not had diarrhea.  She is on Eliquis for atrial fibrillation.   Past Medical History:  Diagnosis Date  . Atrial fibrillation (HCC)   . Bell's palsy 2009;    on the right  . Brain TIA    pt denies this hx on 07/18/2013; "they thought it was a stroke but dr said later that it was Bell's Palsy"  . Cerebrovascular disease   . GERD (gastroesophageal reflux disease)   . Hepatitis    "don't remember what kind; had it ~ 3 times when I was younger; last time was in the 1990's"  . Hyperlipidemia   . Hypertension   . Pancreatitis     Past Surgical History:  Procedure Laterality Date  . APPENDECTOMY  ~ 1971  . CATARACT EXTRACTION Right   . CHOLECYSTECTOMY  ~ 1971  . PACEMAKER IMPLANT N/A 09/27/2019   Procedure: PACEMAKER IMPLANT;  Surgeon: Duke Salvia, MD;  Location: [facility] INVASIVE CV LAB;  Service: Cardiovascular;  Laterality: N/A;  . TONSILLECTOMY  1949    Family History  Problem Relation Age of Onset  . Pneumonia Mother        old age  . Heart attack Father   . Diabetes Father   . Diabetes Sister   . Diabetes Brother   . Cancer Sister   . Diabetes Sister   . Diabetes Sister     Social History   Tobacco Use  . Smoking status: Never Smoker  .  Smokeless tobacco: Never Used  Vaping Use  . Vaping Use: Never used  Substance Use Topics  . Alcohol use: No  . Drug use: No    Prior to Admission medications   Medication Sig Start Date End Date Taking? Authorizing Provider  acetaminophen (TYLENOL) 325 MG tablet Take 2 tablets (650 mg total) by mouth every 6 (six) hours as needed for mild pain (or Fever >/= 101). 03/05/19  Yes Myrlene Broker, MD  ascorbic acid (VITAMIN C) 500 MG tablet Take 500 mg by mouth daily.   Yes [provider]  cholecalciferol (VITAMIN D3) 25 MCG (1000 UNIT) tablet Take 1,000 Units by mouth at bedtime.    Yes [provider]  CREON 24000-76000 units CPEP Take 1 capsule by mouth 3 (three) times daily. 04/23/20  Yes [provider]  diltiazem (CARDIZEM CD) 180 MG 24 hr capsule Take 1 capsule (180 mg total) by mouth daily. 01/08/20 04/07/20 Yes Duke Salvia, MD  ELIQUIS 2.5 MG TABS tablet Take 2.5 mg by mouth 2 (two) times daily. 05/19/20  Yes [provider]  pantoprazole (PROTONIX) 40 MG tablet Take 40 mg by mouth daily. 05/01/20  Yes [provider]  apixaban (ELIQUIS) 5 MG TABS tablet Take 1 tablet (5  mg total) by mouth 2 (two) times daily. Patient not taking: Reported on 05/22/2020 07/23/19   Glade Lloyd, MD  losartan (COZAAR) 50 MG tablet Take 1 tablet (50 mg total) by mouth daily. 07/24/19 10/11/19  Glade Lloyd, MD    Allergies Tessalon perles [benzonatate], Neurontin [gabapentin], and Zantac [ranitidine hcl]   REVIEW OF SYSTEMS  Negative except as noted here or in the History of Present Illness.   PHYSICAL EXAMINATION  Initial Vital Signs Blood pressure (!) 171/90, pulse 95, temperature 98.2 F (36.8 C), temperature source Oral, resp. rate (!) 22, height 5' (1.524 m), weight 70 kg, SpO2 100 %.  Examination General: Well-developed, well-nourished female in no acute distress; appearance consistent with age of record HENT: normocephalic; atraumatic Eyes:  pupils equal, round and reactive to light; extraocular muscles intact; bilateral pseudophakia Neck: supple Heart: Irregular rhythm Lungs: clear to auscultation bilaterally Abdomen: soft; nondistended; epigastric tenderness; bowel sounds present Extremities: No deformity; full range of motion; pulses normal Neurologic: Awake, alert and oriented; motor function intact in all extremities and symmetric; no facial droop Skin: Warm and dry Psychiatric: Moaning; grimacing   RESULTS  Summary of this visit's results, reviewed and interpreted by myself:   EKG Interpretation  Date/Time:  Friday May 22 2020 06:08:55 EDT Ventricular Rate:  100 PR Interval:    QRS Duration: 81 QT Interval:  380 QTC Calculation: 439 R Axis:   -45 Text Interpretation: Duplicate Delete Confirmed by Paula Libra ([id number]) on 05/22/2020 6:11:46 AM      Laboratory Studies: Results for orders placed or performed during the hospital encounter of 05/22/20 (from the past 24 hour(s))  CBC with Differential/Platelet     Status: Abnormal   Collection Time: 05/22/20  6:00 AM  Result Value Ref Range   WBC 8.5 4.0 - 10.5 K/uL   RBC 5.05 3.87 - 5.11 MIL/uL   Hemoglobin 15.2 (H) 12.0 - 15.0 g/dL   HCT 99.3 57.0 - 17.7 %   MCV 90.3 80.0 - 100.0 fL   MCH 30.1 26.0 - 34.0 pg   MCHC 33.3 30.0 - 36.0 g/dL   RDW 93.9 03.0 - 09.2 %   Platelets 180 150 - 400 K/uL   nRBC 0.0 0.0 - 0.2 %   Neutrophils Relative % 70 %   Neutro Abs 5.9 1.7 - 7.7 K/uL   Lymphocytes Relative 22 %   Lymphs Abs 1.8 0.7 - 4.0 K/uL   Monocytes Relative 7 %   Monocytes Absolute 0.6 0.1 - 1.0 K/uL   Eosinophils Relative 1 %   Eosinophils Absolute 0.1 0.0 - 0.5 K/uL   Basophils Relative 0 %   Basophils Absolute 0.0 0.0 - 0.1 K/uL   Immature Granulocytes 0 %   Abs Immature Granulocytes 0.02 0.00 - 0.07 K/uL  Comprehensive metabolic panel     Status: Abnormal   Collection Time: 05/22/20  6:00 AM  Result Value Ref Range   Sodium 139 135 - 145 mmol/L    Potassium 3.7 3.5 - 5.1 mmol/L   Chloride 102 98 - 111 mmol/L   CO2 26 22 - 32 mmol/L   Glucose, Bld 139 (H) 70 - 99 mg/dL   BUN 19 8 - 23 mg/dL   Creatinine, Ser 3.30 (H) 0.44 - 1.00 mg/dL   Calcium 07.6 (H) 8.9 - 10.3 mg/dL   Total Protein 7.8 6.5 - 8.1 g/dL   Albumin 4.4 3.5 - 5.0 g/dL   AST 15 15 - 41 U/L   ALT 11 0 - 44  U/L   Alkaline Phosphatase 92 38 - 126 U/L   Total Bilirubin 0.5 0.3 - 1.2 mg/dL   GFR, Estimated 47 (L) >60 mL/min   Anion gap 11 5 - 15  Lipase, blood     Status: Abnormal   Collection Time: 05/22/20  6:00 AM  Result Value Ref Range   Lipase 3,284 (H) 11 - 51 U/L   Imaging Studies: CT ABDOMEN PELVIS W CONTRAST  Result Date: 05/22/2020 CLINICAL DATA:  Abdominal pain with pancreatitis suspected EXAM: CT ABDOMEN AND PELVIS WITH CONTRAST TECHNIQUE: Multidetector CT imaging of the abdomen and pelvis was performed using the standard protocol following bolus administration of intravenous contrast. CONTRAST:  OMNIPAQUE IOHEXOL 300 MG/ML  SOLN COMPARISON:  02/28/2019 FINDINGS: Lower chest: Trace right pleural effusion. Atelectasis in the lower lungs. Moderate sliding hiatal hernia. Hepatobiliary: No focal liver abnormality.Cholecystectomy. No bile duct dilatation or visible calculus. Pancreas: Peripancreatic edema generalized pancreatic expansion and septal edema. No collection or necrosis. Probable pancreas divisum. Spleen: Unremarkable. Adrenals/Urinary Tract: Negative adrenals. No hydronephrosis or stone. Unremarkable bladder. Stomach/Bowel: No obstruction. No primary bowel inflammation is suspected. Duodenal diverticula as noted previously. Small hiatal hernia. Vascular/Lymphatic: No acute vascular abnormality. No mass or adenopathy. Reproductive:Subcentimeter uterine fibroid. Other: No ascites or pneumoperitoneum. Musculoskeletal: No acute abnormalities. IMPRESSION: 1. Acute edematous pancreatitis.  No collection. 2. Suspect pancreas divisum. Electronically Signed   By:  Marnee Spring M.D.   On: 05/22/2020 07:52    ED COURSE and MDM  Nursing notes, initial and subsequent vitals signs, including pulse oximetry, reviewed and interpreted by myself.  Vitals:   05/22/20 1115 05/22/20 1230 05/22/20 1646 05/22/20 2056  BP: (!) 152/98 (!) 150/102 136/61 108/60  Pulse: (!) 108 93 72 72  Resp: 20 20 18 20   Temp: 98.1 F (36.7 C) 98.3 F (36.8 C) 98.5 F (36.9 C) 98.8 F (37.1 C)  TempSrc: Oral Oral Oral Oral  SpO2: 98% 99% 99% 97%  Weight:      Height:       Medications  fentaNYL (SUBLIMAZE) injection 50 mcg (50 mcg Intravenous Given 05/22/20 0556)  fentaNYL (SUBLIMAZE) injection 50 mcg (has no administration in time range)  ondansetron (ZOFRAN) injection 4 mg (4 mg Intravenous Given 05/22/20 0558)  0.9 %  sodium chloride infusion ( Intravenous New Bag/Given 05/22/20 0632)   7:00 AM CT pending. Signed out to Dr. 07/22/20. Anticipate admission.   PROCEDURES  Procedures   ED DIAGNOSES     ICD-10-CM   1. Acute pancreatitis without infection or necrosis, unspecified pancreatitis type  K85.90        Pinki Rottman, Charm Barges, MD 05/22/20 2232

## 2020-05-23 DIAGNOSIS — K859 Acute pancreatitis without necrosis or infection, unspecified: Secondary | ICD-10-CM | POA: Diagnosis not present

## 2020-05-23 LAB — COMPREHENSIVE METABOLIC PANEL
ALT: 44 U/L (ref 0–44)
AST: 47 U/L — ABNORMAL HIGH (ref 15–41)
Albumin: 2.8 g/dL — ABNORMAL LOW (ref 3.5–5.0)
Alkaline Phosphatase: 74 U/L (ref 38–126)
Anion gap: 6 (ref 5–15)
BUN: 14 mg/dL (ref 8–23)
CO2: 25 mmol/L (ref 22–32)
Calcium: 8.8 mg/dL — ABNORMAL LOW (ref 8.9–10.3)
Chloride: 104 mmol/L (ref 98–111)
Creatinine, Ser: 1.01 mg/dL — ABNORMAL HIGH (ref 0.44–1.00)
GFR, Estimated: 54 mL/min — ABNORMAL LOW (ref >60)
Glucose, Bld: 89 mg/dL (ref 70–99)
Potassium: 4.1 mmol/L (ref 3.5–5.1)
Sodium: 135 mmol/L (ref 135–145)
Total Bilirubin: 0.9 mg/dL (ref 0.3–1.2)
Total Protein: 5.7 g/dL — ABNORMAL LOW (ref 6.5–8.1)

## 2020-05-23 NOTE — Progress Notes (Addendum)
PROGRESS NOTE    Danielle Colon [facility]  HUT:[id number] DOB: Feb 05, 1930 DOA: 05/22/2020 PCP: Daisy Floro, MD   Brief Narrative: This 87 years old female with  PMH significant of A. fib on anticoagulation, s/p pacemaker, hypertension, hyperlipidemia, grade 3 diastolic dysfunction on last echocardiogram, idiopathic pancreatitis status postcholecystectomy in the past presents in the ED with c/o: abdominal pain, describes pain as epigastric, radiating towards the back, 10 /10 associated with nausea and vomiting. Of note patient was hospitalized last year for acute pancreatitis of unclear etiology,  she underwent cholecystectomy many years back for gallstones, Her home medication list includes Cardizem, Eliquis and Creon's with meals. In the ED she is found to have a lipase of 3200, CT abdomen and pelvis showed edematous acute pancreatitis.  Patient is admitted for acute pancreatitis,  started on IV fluids.  Assessment & Plan:   Principal Problem:   Acute pancreatitis Active Problems:   Benign hypertension   GERD (gastroesophageal reflux disease)   Hyperlipidemia   Atrial fibrillation (HCC)   Pacemaker   Acute on chronic pancreatitis : Patient presented with acute abdominal pain radiating towards the back. She reports taking Creon with meals. This is her second episode requiring hospitalization in 1 year. Her pain started after she had a fried fish sandwich. Unclear if she is taking hydrochlorothiazide. CT abdomen no evidence of pancreatic ductal stones or CBD stones. Lipase 3200, CT abdomen acute edematous pancreatitis. Continue IV fluids Ringer's lactate at 75 mill per hour. Adequate pain control with  Continue to trend lipase.  Chronic atrial fibrillation:  Heart rate controlled.  She is a s/p pacemaker. Continue Cardizem and anticoagulation with Eliquis.  Hypertension:  Continue Cardizem.  Blood pressures controlled.  Hyperlipidemia Resume fish oil and avoid fatty  meals.  GERD: Continue PPI  Mild hypercalcemia :  Improved. Could be due to dehydration.   DVT prophylaxis: Eliquis Code Status: Full code Family Communication: No family at bedside Disposition Plan:   Status is: Observation  The patient remains OBS appropriate and will d/c before 2 midnights.  Dispo: The patient is from: Home              Anticipated d/c is to: Home              Patient currently is not medically stable to d/c.   Difficult to place patient No   Consultants:   None  Procedures: CT abdomen and pelvis    Antimicrobials:  Anti-infectives (From admission, onward)   None      Subjective: Patient was seen and examined at bedside. Overnight events noted.   Patient reports feeling better,  patient was started on clear liquid diet,  tolerating well. states pain is improving.  Objective: Vitals:   05/22/20 2056 05/23/20 0038 05/23/20 0434 05/23/20 1356  BP: 108/60 (!) 125/59 123/68 129/66  Pulse: 72 68 63 71  Resp: 20 16 16 17   Temp: 98.8 F (37.1 C) 97.7 F (36.5 C) 97.6 F (36.4 C) 98.3 F (36.8 C)  TempSrc: Oral Oral Oral   SpO2: 97% 96% 96% 97%  Weight:      Height:        Intake/Output Summary (Last 24 hours) at 05/23/2020 1454 Last data filed at 05/23/2020 1300 Gross per 24 hour  Intake 480 ml  Output 2225 ml  Net -1745 ml   Filed Weights   05/22/20 0549  Weight: 70 kg    Examination:  General exam: Appears calm and comfortable , not in any  acute distress. Respiratory system: Clear to auscultation. Respiratory effort normal. Cardiovascular system: S1 & S2 heard, RRR. No JVD, murmurs, rubs, gallops or clicks. No pedal edema. Gastrointestinal system: Abdomen is nondistended, soft and sore.. No organomegaly or masses felt. Normal bowel sounds heard. Central nervous system: Alert and oriented. No focal neurological deficits. Extremities: Symmetric 5 x 5 power.  No edema, no cyanosis, no clubbing. Skin: No rashes, lesions or  ulcers Psychiatry: Judgement and insight appear normal. Mood & affect appropriate.     Data Reviewed: I have personally reviewed following labs and imaging studies  CBC: Recent Labs  Lab 05/22/20 0600  WBC 8.5  NEUTROABS 5.9  HGB 15.2*  HCT 45.6  MCV 90.3  PLT 180   Basic Metabolic Panel: Recent Labs  Lab 05/22/20 0600 05/23/20 0539  NA 139 135  K 3.7 4.1  CL 102 104  CO2 26 25  GLUCOSE 139* 89  BUN 19 14  CREATININE 1.13* 1.01*  CALCIUM 10.4* 8.8*   GFR: Estimated Creatinine Clearance: 33.6 mL/min (A) (by C-G formula based on SCr of 1.01 mg/dL (H)). Liver Function Tests: Recent Labs  Lab 05/22/20 0600 05/23/20 0539  AST 15 47*  ALT 11 44  ALKPHOS 92 74  BILITOT 0.5 0.9  PROT 7.8 5.7*  ALBUMIN 4.4 2.8*   Recent Labs  Lab 05/22/20 0600  LIPASE 3,284*   No results for input(s): AMMONIA in the last 168 hours. Coagulation Profile: No results for input(s): INR, PROTIME in the last 168 hours. Cardiac Enzymes: No results for input(s): CKTOTAL, CKMB, CKMBINDEX, TROPONINI in the last 168 hours. BNP (last 3 results) No results for input(s): PROBNP in the last 8760 hours. HbA1C: No results for input(s): HGBA1C in the last 72 hours. CBG: No results for input(s): GLUCAP in the last 168 hours. Lipid Profile: No results for input(s): CHOL, HDL, LDLCALC, TRIG, CHOLHDL, LDLDIRECT in the last 72 hours. Thyroid Function Tests: No results for input(s): TSH, T4TOTAL, FREET4, T3FREE, THYROIDAB in the last 72 hours. Anemia Panel: No results for input(s): VITAMINB12, FOLATE, FERRITIN, TIBC, IRON, RETICCTPCT in the last 72 hours. Sepsis Labs: No results for input(s): PROCALCITON, LATICACIDVEN in the last 168 hours.  Recent Results (from the past 240 hour(s))  Resp Panel by RT-PCR (Flu A&B, Covid) Nasopharyngeal Swab     Status: None   Collection Time: 05/22/20  6:02 AM   Specimen: Nasopharyngeal Swab; Nasopharyngeal(NP) swabs in vial transport medium  Result Value  Ref Range Status   SARS Coronavirus 2 by RT PCR NEGATIVE NEGATIVE Final    Comment: (NOTE) SARS-CoV-2 target nucleic acids are NOT DETECTED.  The SARS-CoV-2 RNA is generally detectable in upper respiratory specimens during the acute phase of infection. The lowest concentration of SARS-CoV-2 viral copies this assay can detect is 138 copies/mL. A negative result does not preclude SARS-Cov-2 infection and should not be used as the sole basis for treatment or other patient management decisions. A negative result may occur with  improper specimen collection/handling, submission of specimen other than nasopharyngeal swab, presence of viral mutation(s) within the areas targeted by this assay, and inadequate number of viral copies(<138 copies/mL). A negative result must be combined with clinical observations, patient history, and epidemiological information. The expected result is Negative.  Fact Sheet for Patients:  BloggerCourse.com  Fact Sheet for Healthcare Providers:  SeriousBroker.it  This test is no t yet approved or cleared by the [place] FDA and  has been authorized for detection and/or diagnosis of SARS-CoV-2 by FDA  under an Emergency Use Authorization (EUA). This EUA will remain  in effect (meaning this test can be used) for the duration of the COVID-19 declaration under Section 564(b)(1) of the Act, 21 U.S.C.section 360bbb-3(b)(1), unless the authorization is terminated  or revoked sooner.       Influenza A by PCR NEGATIVE NEGATIVE Final   Influenza B by PCR NEGATIVE NEGATIVE Final    Comment: (NOTE) The Xpert Xpress SARS-CoV-2/FLU/RSV plus assay is intended as an aid in the diagnosis of influenza from Nasopharyngeal swab specimens and should not be used as a sole basis for treatment. Nasal washings and aspirates are unacceptable for Xpert Xpress SARS-CoV-2/FLU/RSV testing.  Fact Sheet for  Patients: BloggerCourse.com  Fact Sheet for Healthcare Providers: SeriousBroker.it  This test is not yet approved or cleared by the [place] FDA and has been authorized for detection and/or diagnosis of SARS-CoV-2 by FDA under an Emergency Use Authorization (EUA). This EUA will remain in effect (meaning this test can be used) for the duration of the COVID-19 declaration under Section 564(b)(1) of the Act, 21 U.S.C. section 360bbb-3(b)(1), unless the authorization is terminated or revoked.  Performed at [facility], [address] [place], [place] [address]     Radiology Studies: CT ABDOMEN PELVIS W CONTRAST  Result Date: 05/22/2020 CLINICAL DATA:  Abdominal pain with pancreatitis suspected EXAM: CT ABDOMEN AND PELVIS WITH CONTRAST TECHNIQUE: Multidetector CT imaging of the abdomen and pelvis was performed using the standard protocol following bolus administration of intravenous contrast. CONTRAST:  OMNIPAQUE IOHEXOL 300 MG/ML  SOLN COMPARISON:  02/28/2019 FINDINGS: Lower chest: Trace right pleural effusion. Atelectasis in the lower lungs. Moderate sliding hiatal hernia. Hepatobiliary: No focal liver abnormality.Cholecystectomy. No bile duct dilatation or visible calculus. Pancreas: Peripancreatic edema generalized pancreatic expansion and septal edema. No collection or necrosis. Probable pancreas divisum. Spleen: Unremarkable. Adrenals/Urinary Tract: Negative adrenals. No hydronephrosis or stone. Unremarkable bladder. Stomach/Bowel: No obstruction. No primary bowel inflammation is suspected. Duodenal diverticula as noted previously. Small hiatal hernia. Vascular/Lymphatic: No acute vascular abnormality. No mass or adenopathy. Reproductive:Subcentimeter uterine fibroid. Other: No ascites or pneumoperitoneum. Musculoskeletal: No acute abnormalities. IMPRESSION: 1. Acute edematous pancreatitis.  No collection. 2.  Suspect pancreas divisum. Electronically Signed   By: Marnee Spring M.D.   On: 05/22/2020 07:52    Scheduled Meds: . apixaban  2.5 mg Oral BID  . ascorbic acid  500 mg Oral Daily  . cholecalciferol  1,000 Units Oral QHS  . diltiazem  180 mg Oral Daily  . lipase/protease/amylase  12,000 Units Oral TID WC  . omega-3 acid ethyl esters  1 g Oral Daily  . pantoprazole  40 mg Oral Daily   Continuous Infusions: . lactated ringers 75 mL/hr at 05/23/20 0349     LOS: 0 days    Time spent: 35 mins.    Cipriano Bunker, MD [facility]   If 7PM-7AM, please contact night-coverage

## 2020-05-24 ENCOUNTER — Encounter (HOSPITAL_COMMUNITY): Payer: Self-pay | Admitting: Family Medicine

## 2020-05-24 DIAGNOSIS — K859 Acute pancreatitis without necrosis or infection, unspecified: Secondary | ICD-10-CM | POA: Diagnosis not present

## 2020-05-24 DIAGNOSIS — K8500 Idiopathic acute pancreatitis without necrosis or infection: Secondary | ICD-10-CM | POA: Diagnosis not present

## 2020-05-24 DIAGNOSIS — I482 Chronic atrial fibrillation, unspecified: Secondary | ICD-10-CM | POA: Diagnosis not present

## 2020-05-24 LAB — COMPREHENSIVE METABOLIC PANEL
ALT: 31 U/L (ref 0–44)
AST: 26 U/L (ref 15–41)
Albumin: 2.8 g/dL — ABNORMAL LOW (ref 3.5–5.0)
Alkaline Phosphatase: 78 U/L (ref 38–126)
Anion gap: 8 (ref 5–15)
BUN: 10 mg/dL (ref 8–23)
CO2: 25 mmol/L (ref 22–32)
Calcium: 9 mg/dL (ref 8.9–10.3)
Chloride: 105 mmol/L (ref 98–111)
Creatinine, Ser: 0.98 mg/dL (ref 0.44–1.00)
GFR, Estimated: 56 mL/min — ABNORMAL LOW (ref >60)
Glucose, Bld: 95 mg/dL (ref 70–99)
Potassium: 4.6 mmol/L (ref 3.5–5.1)
Sodium: 138 mmol/L (ref 135–145)
Total Bilirubin: 1.1 mg/dL (ref 0.3–1.2)
Total Protein: 6 g/dL — ABNORMAL LOW (ref 6.5–8.1)

## 2020-05-24 LAB — CBC
HCT: 35.7 % — ABNORMAL LOW (ref 36.0–46.0)
Hemoglobin: 11.8 g/dL — ABNORMAL LOW (ref 12.0–15.0)
MCH: 30.6 pg (ref 26.0–34.0)
MCHC: 33.1 g/dL (ref 30.0–36.0)
MCV: 92.5 fL (ref 80.0–100.0)
Platelets: 155 10*3/uL (ref 150–400)
RBC: 3.86 MIL/uL — ABNORMAL LOW (ref 3.87–5.11)
RDW: 13.1 % (ref 11.5–15.5)
WBC: 8.6 10*3/uL (ref 4.0–10.5)
nRBC: 0 % (ref 0.0–0.2)

## 2020-05-24 LAB — MAGNESIUM: Magnesium: 1.7 mg/dL (ref 1.7–2.4)

## 2020-05-24 LAB — PHOSPHORUS: Phosphorus: 3 mg/dL (ref 2.5–4.6)

## 2020-05-24 LAB — LIPASE, BLOOD: Lipase: 79 U/L — ABNORMAL HIGH (ref 11–51)

## 2020-05-24 NOTE — Progress Notes (Signed)
Progress Note    Danielle Colon  LZJ:[id number] DOB: [birth year]  DOA: 05/22/2020 PCP: Daisy Floro, MD      Brief Narrative:    Medical records reviewed and are as summarized below:  Danielle Colon is a 87 y.o. female       Assessment/Plan:   Principal Problem:   Acute pancreatitis Active Problems:   Benign hypertension   GERD (gastroesophageal reflux disease)   Hyperlipidemia   Atrial fibrillation (HCC)   Pacemaker   Body mass index is 30.14 kg/m.  (Obesity)    Acute pancreatitis, history of recurrent acute pancreatitis, suspected pancreas divisum: Advance diet from clear liquid diet to full liquid diet.  Analgesics as needed for pain.  Discontinue IV fluids.  Chronic atrial fibrillation, s/p pacemaker, continue Cardizem and Eliquis  Generalized weakness: Consult PT for further evaluation  Other comorbidities include hypertension, hyperlipidemia, GERD.  Diet Order            Diet full liquid Room service appropriate? Yes; Fluid consistency: Thin  Diet effective now                    Consultants:  None  Procedures:  None    Medications:   . apixaban  2.5 mg Oral BID  . ascorbic acid  500 mg Oral Daily  . cholecalciferol  1,000 Units Oral QHS  . diltiazem  180 mg Oral Daily  . lipase/protease/amylase  12,000 Units Oral TID WC  . omega-3 acid ethyl esters  1 g Oral Daily  . pantoprazole  40 mg Oral Daily   Continuous Infusions:    Anti-infectives (From admission, onward)   None             Family Communication/Anticipated D/C date and plan/Code Status   DVT prophylaxis: apixaban (ELIQUIS) tablet 2.5 mg Start: 05/22/20 2200 apixaban (ELIQUIS) tablet 2.5 mg     Code Status: Full Code  Family Communication: Daughter at the bedside Disposition Plan:    Status is: Observation  The patient will require care spanning > 2 midnights and should be moved to inpatient because: Unsafe d/c plan and Inpatient level  of care appropriate due to severity of illness  Dispo: The patient is from: Home              Anticipated d/c is to: Home              Patient currently is not medically stable to d/c.   Difficult to place patient No           Subjective:   c/o generalized weakness and "tender" abdomen  Objective:    Vitals:   05/23/20 1356 05/23/20 2049 05/24/20 0441 05/24/20 1432  BP: 129/66 126/74 121/65 129/63  Pulse: 71 72 61 65  Resp: 17 16 16 16   Temp: 98.3 F (36.8 C) 98.7 F (37.1 C) 98.2 F (36.8 C) (!) 97.5 F (36.4 C)  TempSrc:  Oral Oral   SpO2: 97% 96% 95% 97%  Weight:      Height:       No data found.   Intake/Output Summary (Last 24 hours) at 05/24/2020 1543 Last data filed at 05/24/2020 0432 Gross per 24 hour  Intake 240 ml  Output 2400 ml  Net -2160 ml   Filed Weights   05/22/20 0549  Weight: 70 kg    Exam:  GEN: NAD SKIN: Warm and dry EYES: EOMI ENT: MMM CV: RRR PULM: CTA B ABD:  soft, ND, epigastric and left upper quadrant tenderness, no rebound tenderness or guarding, +BS CNS: AAO x 3, non focal EXT: No edema or tenderness        Data Reviewed:   I have personally reviewed following labs and imaging studies:  Labs: Labs show the following:   Basic Metabolic Panel: Recent Labs  Lab 05/22/20 0600 05/23/20 0539 05/24/20 0535  NA 139 135 138  K 3.7 4.1 4.6  CL 102 104 105  CO2 26 25 25   GLUCOSE 139* 89 95  BUN 19 14 10   CREATININE 1.13* 1.01* 0.98  CALCIUM 10.4* 8.8* 9.0  MG  --   --  1.7  PHOS  --   --  3.0   GFR Estimated Creatinine Clearance: 34.6 mL/min (by C-G formula based on SCr of 0.98 mg/dL). Liver Function Tests: Recent Labs  Lab 05/22/20 0600 05/23/20 0539 05/24/20 0535  AST 15 47* 26  ALT 11 44 31  ALKPHOS 92 74 78  BILITOT 0.5 0.9 1.1  PROT 7.8 5.7* 6.0*  ALBUMIN 4.4 2.8* 2.8*   Recent Labs  Lab 05/22/20 0600 05/24/20 0535  LIPASE 3,284* 79*   No results for input(s): AMMONIA in the last 168  hours. Coagulation profile No results for input(s): INR, PROTIME in the last 168 hours.  CBC: Recent Labs  Lab 05/22/20 0600 05/24/20 0535  WBC 8.5 8.6  NEUTROABS 5.9  --   HGB 15.2* 11.8*  HCT 45.6 35.7*  MCV 90.3 92.5  PLT 180 155   Cardiac Enzymes: No results for input(s): CKTOTAL, CKMB, CKMBINDEX, TROPONINI in the last 168 hours. BNP (last 3 results) No results for input(s): PROBNP in the last 8760 hours. CBG: No results for input(s): GLUCAP in the last 168 hours. D-Dimer: No results for input(s): DDIMER in the last 72 hours. Hgb A1c: No results for input(s): HGBA1C in the last 72 hours. Lipid Profile: No results for input(s): CHOL, HDL, LDLCALC, TRIG, CHOLHDL, LDLDIRECT in the last 72 hours. Thyroid function studies: No results for input(s): TSH, T4TOTAL, T3FREE, THYROIDAB in the last 72 hours.  Invalid input(s): FREET3 Anemia work up: No results for input(s): VITAMINB12, FOLATE, FERRITIN, TIBC, IRON, RETICCTPCT in the last 72 hours. Sepsis Labs: Recent Labs  Lab 05/22/20 0600 05/24/20 0535  WBC 8.5 8.6    Microbiology Recent Results (from the past 240 hour(s))  Resp Panel by RT-PCR (Flu A&B, Covid) Nasopharyngeal Swab     Status: None   Collection Time: 05/22/20  6:02 AM   Specimen: Nasopharyngeal Swab; Nasopharyngeal(NP) swabs in vial transport medium  Result Value Ref Range Status   SARS Coronavirus 2 by RT PCR NEGATIVE NEGATIVE Final    Comment: (NOTE) SARS-CoV-2 target nucleic acids are NOT DETECTED.  The SARS-CoV-2 RNA is generally detectable in upper respiratory specimens during the acute phase of infection. The lowest concentration of SARS-CoV-2 viral copies this assay can detect is 138 copies/mL. A negative result does not preclude SARS-Cov-2 infection and should not be used as the sole basis for treatment or other patient management decisions. A negative result may occur with  improper specimen collection/handling, submission of specimen  other than nasopharyngeal swab, presence of viral mutation(s) within the areas targeted by this assay, and inadequate number of viral copies(<138 copies/mL). A negative result must be combined with clinical observations, patient history, and epidemiological information. The expected result is Negative.  Fact Sheet for Patients:  07/24/20  Fact Sheet for Healthcare Providers:  07/22/20  This test is no t  yet approved or cleared by the [place] and  has been authorized for detection and/or diagnosis of SARS-CoV-2 by FDA under an Emergency Use Authorization (EUA). This EUA will remain  in effect (meaning this test can be used) for the duration of the COVID-19 declaration under Section 564(b)(1) of the Act, 21 U.S.C.section 360bbb-3(b)(1), unless the authorization is terminated  or revoked sooner.       Influenza A by PCR NEGATIVE NEGATIVE Final   Influenza B by PCR NEGATIVE NEGATIVE Final    Comment: (NOTE) The Xpert Xpress SARS-CoV-2/FLU/RSV plus assay is intended as an aid in the diagnosis of influenza from Nasopharyngeal swab specimens and should not be used as a sole basis for treatment. Nasal washings and aspirates are unacceptable for Xpert Xpress SARS-CoV-2/FLU/RSV testing.  Fact Sheet for Patients: BloggerCourse.com  Fact Sheet for Healthcare Providers: SeriousBroker.it  This test is not yet approved or cleared by the [place] FDA and has been authorized for detection and/or diagnosis of SARS-CoV-2 by FDA under an Emergency Use Authorization (EUA). This EUA will remain in effect (meaning this test can be used) for the duration of the COVID-19 declaration under Section 564(b)(1) of the Act, 21 U.S.C. section 360bbb-3(b)(1), unless the authorization is terminated or revoked.  Performed at [facility], [address] [place], [place] [address]     Procedures and diagnostic studies:  No results found.             LOS: 0 days   Hersel Mcmeen  [facility]   Pager on [email or web address]. If 7PM-7AM, please contact night-coverage at [email or web address]     05/24/2020, 3:43 PM

## 2020-05-24 NOTE — Evaluation (Signed)
Physical Therapy Evaluation Patient Details Name: Danielle Colon MRN: [id number] DOB: July 09, 1930 Today's Date: 05/24/2020   History of Present Illness  87 yo female admitted with pancreatitis. Hx of Afib, pacemaker, CHF, idiopathic pancreatitis  Clinical Impression  On eval, pt required Min assist for mobility. She walked ~100 feet with support of IV pole and hallway handrail. Pt presents with general weakness, decreased activity tolerance and impaired gait and balance. Pt reported that she had not been OOB at all this admission. Recommend daily mobility/ambulation with nursing assistance. Will plan to follow and progress activity as tolerated. Recommend HHPT f/u if pt/daughter are agreeable.     Follow Up Recommendations Home health PT    Equipment Recommendations  None recommended by PT    Recommendations for Other Services       Precautions / Restrictions Precautions Precautions: Fall Precaution Comments: incontinent (wears pads) Restrictions Weight Bearing Restrictions: No      Mobility  Bed Mobility Overal bed mobility: Modified Independent                  Transfers Overall transfer level: Needs assistance   Transfers: Sit to/from Stand;Stand Pivot Transfers Sit to Stand: Min guard Stand pivot transfers: Min guard       General transfer comment: Min guard for safety. Pt held onto bsc armrests to steady.  Ambulation/Gait Ambulation/Gait assistance: Min assist Gait Distance (Feet): 100 Feet Assistive device: IV Pole (+ hallway handrail) Gait Pattern/deviations: Step-through pattern;Decreased stride length     General Gait Details: Unsteady. Several brief standing rest breaks 2* dyspnea. O2 94% on RA. Assist to stabilize pt throughout distance  Stairs            Wheelchair Mobility    Modified Rankin (Stroke Patients Only)       Balance Overall balance assessment: Needs assistance         Standing balance support: Bilateral upper  extremity supported Standing balance-Leahy Scale: Poor                               Pertinent Vitals/Pain Pain Assessment: No/denies pain    Home Living Family/patient expects to be discharged to:: Private residence Living Arrangements: Children Available Help at Discharge: Family;Available PRN/intermittently Type of Home: House Home Access: Level entry     Home Layout: One level Home Equipment: Walker - 2 wheels      Prior Function Level of Independence: Independent         Comments: ambulates without AD, independent in ADLs daughter assists with iADLs     Hand Dominance        Extremity/Trunk Assessment   Upper Extremity Assessment Upper Extremity Assessment: Generalized weakness    Lower Extremity Assessment Lower Extremity Assessment: Generalized weakness    Cervical / Trunk Assessment Cervical / Trunk Assessment: Normal  Communication   Communication: No difficulties  Cognition Arousal/Alertness: Awake/alert Behavior During Therapy: WFL for tasks assessed/performed Overall Cognitive Status: Within Functional Limits for tasks assessed                                        General Comments      Exercises     Assessment/Plan    PT Assessment Patient needs continued PT services  PT Problem List Decreased strength;Decreased mobility;Decreased activity tolerance;Decreased balance;Decreased knowledge of use of DME  PT Treatment Interventions DME instruction;Gait training;Therapeutic exercise;Balance training;Functional mobility training;Therapeutic activities;Patient/family education    PT Goals (Current goals can be found in the Care Plan section)  Acute Rehab PT Goals Patient Stated Goal: to get better. home soon PT Goal Formulation: With patient/family Time For Goal Achievement: 06/07/20 Potential to Achieve Goals: Good    Frequency Min 3X/week   Barriers to discharge        Co-evaluation                AM-PAC PT "6 Clicks" Mobility  Outcome Measure Help needed turning from your back to your side while in a flat bed without using bedrails?: None Help needed moving from lying on your back to sitting on the side of a flat bed without using bedrails?: None Help needed moving to and from a bed to a chair (including a wheelchair)?: A Little Help needed standing up from a chair using your arms (e.g., wheelchair or bedside chair)?: A Little Help needed to walk in hospital room?: A Little Help needed climbing 3-5 steps with a railing? : A Little 6 Click Score: 20    End of Session Equipment Utilized During Treatment: Gait belt Activity Tolerance: Patient limited by fatigue Patient left: in chair;with call bell/phone within reach;with family/visitor present Nurse Communication: made RN aware that pt used bsc;pt no longer had a purewick in place/was wearing briefs and pad PT Visit Diagnosis: Unsteadiness on feet (R26.81);Muscle weakness (generalized) (M62.81)    Time: 1324-4010 PT Time Calculation (min) (ACUTE ONLY): 21 min   Charges:   PT Evaluation $PT Eval Moderate Complexity: 1 Mod             Faye Ramsay, PT Acute Rehabilitation  Office: [phone] Pager: [phone]

## 2020-05-25 DIAGNOSIS — K8500 Idiopathic acute pancreatitis without necrosis or infection: Secondary | ICD-10-CM

## 2020-05-25 NOTE — Discharge Summary (Signed)
Physician Discharge Summary  Danielle Colon [facility] UQJ:[id number] DOB: Aug 03, 1930 DOA: 05/22/2020  PCP: Daisy Floro, MD  Admit date: 05/22/2020 Discharge date: 05/25/2020  Discharge disposition: Home   Recommendations for Outpatient Follow-Up:   Follow-up with PCP in 1 week   Discharge Diagnosis:   Principal Problem:   Acute pancreatitis Active Problems:   Benign hypertension   GERD (gastroesophageal reflux disease)   Hyperlipidemia   Atrial fibrillation [facility])   Pacemaker    Discharge Condition: Stable.  Diet recommendation:  Diet Order            Diet Heart Room service appropriate? Yes; Fluid consistency: Thin  Diet effective now           Diet Heart                   Code Status: Full Code     Hospital Course:   [name] is an 87 year old woman with medical history significant for atrial fibrillation on Eliquis, s/p permanent pacemaker, hypertension, hyperlipidemia, chronic diastolic CHF, history of recurrent pancreatitis, s/p cholecystectomy, who presented to the hospital because of abdominal pain, nausea and vomiting.  She was admitted to the hospital for acute pancreatitis.  She was treated with bowel rest, analgesics, antiemetics and IV fluids.  Her condition has improved and she has been able to tolerate a regular diet without recurrence of symptoms.  She is deemed stable for discharge to home today.  She was seen by the physical therapist because of generalized weakness and home health physical therapy was recommended.       Discharge Exam:    Vitals:   05/24/20 0441 05/24/20 1432 05/24/20 2109 05/25/20 0505  BP: 121/65 129/63 113/67 116/66  Pulse: 61 65 72 64  Resp: 16 16 20 16   Temp: 98.2 F (36.8 C) (!) 97.5 F (36.4 C) 99.7 F (37.6 C) 98.4 F (36.9 C)  TempSrc: Oral  Oral Oral  SpO2: 95% 97% 94% 93%  Weight:      Height:         GEN: NAD SKIN: Warm and dry EYES: EOMI ENT: MMM CV: RRR PULM: CTA B ABD: soft, ND, NT,  +BS CNS: AAO x 3, non focal EXT: No edema or tenderness   The results of significant diagnostics from this hospitalization (including imaging, microbiology, ancillary and laboratory) are listed below for reference.     Procedures and Diagnostic Studies:   CT ABDOMEN PELVIS W CONTRAST  Result Date: 05/22/2020 CLINICAL DATA:  Abdominal pain with pancreatitis suspected EXAM: CT ABDOMEN AND PELVIS WITH CONTRAST TECHNIQUE: Multidetector CT imaging of the abdomen and pelvis was performed using the standard protocol following bolus administration of intravenous contrast. CONTRAST:  07/22/2020 OMNIPAQUE IOHEXOL 300 MG/ML  SOLN COMPARISON:  02/28/2019 FINDINGS: Lower chest: Trace right pleural effusion. Atelectasis in the lower lungs. Moderate sliding hiatal hernia. Hepatobiliary: No focal liver abnormality.Cholecystectomy. No bile duct dilatation or visible calculus. Pancreas: Peripancreatic edema generalized pancreatic expansion and septal edema. No collection or necrosis. Probable pancreas divisum. Spleen: Unremarkable. Adrenals/Urinary Tract: Negative adrenals. No hydronephrosis or stone. Unremarkable bladder. Stomach/Bowel: No obstruction. No primary bowel inflammation is suspected. Duodenal diverticula as noted previously. Small hiatal hernia. Vascular/Lymphatic: No acute vascular abnormality. No mass or adenopathy. Reproductive:Subcentimeter uterine fibroid. Other: No ascites or pneumoperitoneum. Musculoskeletal: No acute abnormalities. IMPRESSION: 1. Acute edematous pancreatitis.  No collection. 2. Suspect pancreas divisum. Electronically Signed   By: 04/28/2019 M.D.   On: 05/22/2020 07:52  Labs:   Basic Metabolic Panel: Recent Labs  Lab 05/22/20 0600 05/23/20 0539 05/24/20 0535  NA 139 135 138  K 3.7 4.1 4.6  CL 102 104 105  CO2 26 25 25   GLUCOSE 139* 89 95  BUN 19 14 10   CREATININE 1.13* 1.01* 0.98  CALCIUM 10.4* 8.8* 9.0  MG  --   --  1.7  PHOS  --   --  3.0   GFR Estimated  Creatinine Clearance: 34.6 mL/min (by C-G formula based on SCr of 0.98 mg/dL). Liver Function Tests: Recent Labs  Lab 05/22/20 0600 05/23/20 0539 05/24/20 0535  AST 15 47* 26  ALT 11 44 31  ALKPHOS 92 74 78  BILITOT 0.5 0.9 1.1  PROT 7.8 5.7* 6.0*  ALBUMIN 4.4 2.8* 2.8*   Recent Labs  Lab 05/22/20 0600 05/24/20 0535  LIPASE 3,284* 79*   No results for input(s): AMMONIA in the last 168 hours. Coagulation profile No results for input(s): INR, PROTIME in the last 168 hours.  CBC: Recent Labs  Lab 05/22/20 0600 05/24/20 0535  WBC 8.5 8.6  NEUTROABS 5.9  --   HGB 15.2* 11.8*  HCT 45.6 35.7*  MCV 90.3 92.5  PLT 180 155   Cardiac Enzymes: No results for input(s): CKTOTAL, CKMB, CKMBINDEX, TROPONINI in the last 168 hours. BNP: Invalid input(s): POCBNP CBG: No results for input(s): GLUCAP in the last 168 hours. D-Dimer No results for input(s): DDIMER in the last 72 hours. Hgb A1c No results for input(s): HGBA1C in the last 72 hours. Lipid Profile No results for input(s): CHOL, HDL, LDLCALC, TRIG, CHOLHDL, LDLDIRECT in the last 72 hours. Thyroid function studies No results for input(s): TSH, T4TOTAL, T3FREE, THYROIDAB in the last 72 hours.  Invalid input(s): FREET3 Anemia work up No results for input(s): VITAMINB12, FOLATE, FERRITIN, TIBC, IRON, RETICCTPCT in the last 72 hours. Microbiology Recent Results (from the past 240 hour(s))  Resp Panel by RT-PCR (Flu A&B, Covid) Nasopharyngeal Swab     Status: None   Collection Time: 05/22/20  6:02 AM   Specimen: Nasopharyngeal Swab; Nasopharyngeal(NP) swabs in vial transport medium  Result Value Ref Range Status   SARS Coronavirus 2 by RT PCR NEGATIVE NEGATIVE Final    Comment: (NOTE) SARS-CoV-2 target nucleic acids are NOT DETECTED.  The SARS-CoV-2 RNA is generally detectable in upper respiratory specimens during the acute phase of infection. The lowest concentration of SARS-CoV-2 viral copies this assay can detect  is 138 copies/mL. A negative result does not preclude SARS-Cov-2 infection and should not be used as the sole basis for treatment or other patient management decisions. A negative result may occur with  improper specimen collection/handling, submission of specimen other than nasopharyngeal swab, presence of viral mutation(s) within the areas targeted by this assay, and inadequate number of viral copies(<138 copies/mL). A negative result must be combined with clinical observations, patient history, and epidemiological information. The expected result is Negative.  Fact Sheet for Patients:  07/24/20  Fact Sheet for Healthcare Providers:  07/22/20  This test is no t yet approved or cleared by the [employer] FDA and  has been authorized for detection and/or diagnosis of SARS-CoV-2 by FDA under an Emergency Use Authorization (EUA). This EUA will remain  in effect (meaning this test can be used) for the duration of the COVID-19 declaration under Section 564(b)(1) of the Act, 21 U.S.C.section 360bbb-3(b)(1), unless the authorization is terminated  or revoked sooner.       Influenza A by PCR NEGATIVE  NEGATIVE Final   Influenza B by PCR NEGATIVE NEGATIVE Final    Comment: (NOTE) The Xpert Xpress SARS-CoV-2/FLU/RSV plus assay is intended as an aid in the diagnosis of influenza from Nasopharyngeal swab specimens and should not be used as a sole basis for treatment. Nasal washings and aspirates are unacceptable for Xpert Xpress SARS-CoV-2/FLU/RSV testing.  Fact Sheet for Patients: [employer]com  Fact Sheet for Healthcare Providers: SeriousBroker.it  This test is not yet approved or cleared by the [place] FDA and has been authorized for detection and/or diagnosis of SARS-CoV-2 by FDA under an Emergency Use Authorization (EUA). This EUA will remain in effect  (meaning this test can be used) for the duration of the COVID-19 declaration under Section 564(b)(1) of the Act, 21 U.S.C. section 360bbb-3(b)(1), unless the authorization is terminated or revoked.  Performed at [facility], [address] [place], [place] [address]      Discharge Instructions:   Discharge Instructions    Diet Heart   Complete by: As directed    Face-to-face encounter (required for Medicare/Medicaid patients)   Complete by: As directed    I Denell Cothern certify that this patient is under my care and that I, or a nurse practitioner or physician's assistant working with me, had a face-to-face encounter that meets the physician face-to-face encounter requirements with this patient on 05/25/2020. The encounter with the patient was in whole, or in part for the following medical condition(s) which is the primary reason for home health care (List medical condition): General weakness   The encounter with the patient was in whole, or in part, for the following medical condition, which is the primary reason for home health care: General weakness   I certify that, based on my findings, the following services are medically necessary home health services: Physical therapy   Reason for Medically Necessary Home Health Services: Therapy- Investment banker, operational, Teacher, early years/pre and Stair Training   My clinical findings support the need for the above services: Unsafe ambulation due to balance issues   Further, I certify that my clinical findings support that this patient is homebound due to: Unsafe ambulation due to balance issues   Home Health   Complete by: As directed    To provide the following care/treatments: PT   Increase activity slowly   Complete by: As directed      Allergies as of 05/25/2020      Reactions   Tessalon Perles [benzonatate] Other (See Comments)   unknown   Neurontin [gabapentin] Other (See Comments)   unknown   Zantac [ranitidine Hcl] Other  (See Comments)   GI upset      Medication List    TAKE these medications   acetaminophen 325 MG tablet Commonly known as: TYLENOL Take 2 tablets (650 mg total) by mouth every 6 (six) hours as needed for mild pain (or Fever >/= 101).   ascorbic acid 500 MG tablet Commonly known as: VITAMIN C Take 500 mg by mouth daily.   cholecalciferol 25 MCG (1000 UNIT) tablet Commonly known as: VITAMIN D3 Take 1,000 Units by mouth at bedtime.   Creon 24000-76000 units Cpep Generic drug: Pancrelipase (Lip-Prot-Amyl) Take 1 capsule by mouth 3 (three) times daily.   diltiazem 180 MG 24 hr capsule Commonly known as: CARDIZEM CD Take 1 capsule (180 mg total) by mouth daily.   Eliquis 2.5 MG Tabs tablet Generic drug: apixaban Take 2.5 mg by mouth 2 (two) times daily. What changed: Another medication with the same name was  removed. Continue taking this medication, and follow the directions you see here.   pantoprazole 40 MG tablet Commonly known as: PROTONIX Take 40 mg by mouth daily.         Time coordinating discharge: 28 minutes  Signed:  Cyd Hostler  [facility] 05/25/2020, 9:42 AM   Pager on [email or web address]. If 7PM-7AM, please contact night-coverage at [email or web address]

## 2020-05-25 NOTE — Progress Notes (Signed)
Went over discharge instructions w/ pt and pt daughter. Pt and daughter verbalized understanding.

## 2020-05-25 NOTE — TOC Transition Note (Signed)
Transition of Care [facility]) - CM/SW Discharge Note   Patient Details  Name: Danielle Colon MRN: [id number] Date of Birth: 1930/06/06  [facility] CM/SW Contact:  Ida Rogue, LCSW Phone Number: 05/25/2020, 10:09 AM   Clinical Narrative:   Patient who is stable for d/c today was seen in follow up to PT recommendation of HH PT.  [name] is excited about departing the hospital today.  She lives at home with her daughter, they have lived together for 5 years, she has all needed DME in the home, takes sponge baths because of her fear of falling getting in or out of the tub. [name] declined [facility] PT; we got her daughter on the phone who agreed that she feels comfortable with her coming home without that service.  No further needs identified.  TOC sign off.    Final next level of care: Home/Self Care Barriers to Discharge: No Barriers Identified   Patient Goals and CMS Choice        Discharge Placement                       Discharge Plan and Services                                     Social Determinants of Health (SDOH) Interventions     Readmission Risk Interventions Readmission Risk Prevention Plan 03/05/2019  Transportation Screening Complete  PCP or Specialist Appt within 5-7 Days Not Complete  Home Care Screening Complete  Medication Review (RN CM) Referral to Pharmacy  Some recent data might be hidden

## 2020-06-01 DIAGNOSIS — K859 Acute pancreatitis without necrosis or infection, unspecified: Secondary | ICD-10-CM | POA: Diagnosis not present

## 2020-06-01 DIAGNOSIS — R899 Unspecified abnormal finding in specimens from other organs, systems and tissues: Secondary | ICD-10-CM | POA: Diagnosis not present

## 2020-06-02 DIAGNOSIS — E7800 Pure hypercholesterolemia, unspecified: Secondary | ICD-10-CM | POA: Diagnosis not present

## 2020-06-02 DIAGNOSIS — N183 Chronic kidney disease, stage 3 unspecified: Secondary | ICD-10-CM | POA: Diagnosis not present

## 2020-06-02 DIAGNOSIS — I10 Essential (primary) hypertension: Secondary | ICD-10-CM | POA: Diagnosis not present

## 2020-06-02 DIAGNOSIS — K219 Gastro-esophageal reflux disease without esophagitis: Secondary | ICD-10-CM | POA: Diagnosis not present

## 2020-06-02 DIAGNOSIS — I480 Paroxysmal atrial fibrillation: Secondary | ICD-10-CM | POA: Diagnosis not present

## 2020-06-02 DIAGNOSIS — M19049 Primary osteoarthritis, unspecified hand: Secondary | ICD-10-CM | POA: Diagnosis not present

## 2020-06-03 DIAGNOSIS — K625 Hemorrhage of anus and rectum: Secondary | ICD-10-CM | POA: Diagnosis not present

## 2020-06-03 DIAGNOSIS — K5901 Slow transit constipation: Secondary | ICD-10-CM | POA: Diagnosis not present

## 2020-06-03 DIAGNOSIS — Z8719 Personal history of other diseases of the digestive system: Secondary | ICD-10-CM | POA: Diagnosis not present

## 2020-06-03 DIAGNOSIS — I480 Paroxysmal atrial fibrillation: Secondary | ICD-10-CM | POA: Diagnosis not present

## 2020-06-08 DIAGNOSIS — Z7689 Persons encountering health services in other specified circumstances: Secondary | ICD-10-CM | POA: Diagnosis not present

## 2020-06-08 DIAGNOSIS — K625 Hemorrhage of anus and rectum: Secondary | ICD-10-CM | POA: Diagnosis not present

## 2020-06-08 DIAGNOSIS — Z8719 Personal history of other diseases of the digestive system: Secondary | ICD-10-CM | POA: Diagnosis not present

## 2020-06-25 DIAGNOSIS — K219 Gastro-esophageal reflux disease without esophagitis: Secondary | ICD-10-CM | POA: Diagnosis not present

## 2020-06-25 DIAGNOSIS — N183 Chronic kidney disease, stage 3 unspecified: Secondary | ICD-10-CM | POA: Diagnosis not present

## 2020-06-25 DIAGNOSIS — I480 Paroxysmal atrial fibrillation: Secondary | ICD-10-CM | POA: Diagnosis not present

## 2020-06-25 DIAGNOSIS — E7800 Pure hypercholesterolemia, unspecified: Secondary | ICD-10-CM | POA: Diagnosis not present

## 2020-06-25 DIAGNOSIS — I10 Essential (primary) hypertension: Secondary | ICD-10-CM | POA: Diagnosis not present

## 2020-06-25 DIAGNOSIS — M19049 Primary osteoarthritis, unspecified hand: Secondary | ICD-10-CM | POA: Diagnosis not present

## 2020-06-26 ENCOUNTER — Ambulatory Visit (INDEPENDENT_AMBULATORY_CARE_PROVIDER_SITE_OTHER): Payer: Medicare Other

## 2020-06-26 DIAGNOSIS — I495 Sick sinus syndrome: Secondary | ICD-10-CM | POA: Diagnosis not present

## 2020-06-26 LAB — CUP PACEART REMOTE DEVICE CHECK
Battery Remaining Longevity: 164 mo
Battery Voltage: 3.15 V
Brady Statistic AP VP Percent: 0.03 %
Brady Statistic AP VS Percent: 55.63 %
Brady Statistic AS VP Percent: 0.02 %
Brady Statistic AS VS Percent: 44.35 %
Brady Statistic RA Percent Paced: 45.15 %
Brady Statistic RV Percent Paced: 2.71 %
Date Time Interrogation Session: 20220609201428
Implantable Lead Implant Date: 20210910
Implantable Lead Implant Date: 20210910
Implantable Lead Location: 753859
Implantable Lead Location: 753860
Implantable Lead Model: 5076
Implantable Lead Model: 5076
Implantable Pulse Generator Implant Date: 20210910
Lead Channel Impedance Value: 342 Ohm
Lead Channel Impedance Value: 399 Ohm
Lead Channel Impedance Value: 456 Ohm
Lead Channel Impedance Value: 494 Ohm
Lead Channel Pacing Threshold Amplitude: 0.5 V
Lead Channel Pacing Threshold Amplitude: 0.625 V
Lead Channel Pacing Threshold Pulse Width: 0.4 ms
Lead Channel Pacing Threshold Pulse Width: 0.4 ms
Lead Channel Sensing Intrinsic Amplitude: 1.125 mV
Lead Channel Sensing Intrinsic Amplitude: 1.125 mV
Lead Channel Sensing Intrinsic Amplitude: 5.75 mV
Lead Channel Sensing Intrinsic Amplitude: 5.75 mV
Lead Channel Setting Pacing Amplitude: 1.5 V
Lead Channel Setting Pacing Amplitude: 2.5 V
Lead Channel Setting Pacing Pulse Width: 0.4 ms
Lead Channel Setting Sensing Sensitivity: 1.2 mV

## 2020-07-07 ENCOUNTER — Encounter: Payer: Medicare Other | Admitting: Internal Medicine

## 2020-07-15 DIAGNOSIS — K5900 Constipation, unspecified: Secondary | ICD-10-CM | POA: Diagnosis not present

## 2020-07-15 DIAGNOSIS — K921 Melena: Secondary | ICD-10-CM | POA: Diagnosis not present

## 2020-07-15 DIAGNOSIS — I480 Paroxysmal atrial fibrillation: Secondary | ICD-10-CM | POA: Diagnosis not present

## 2020-07-17 NOTE — Progress Notes (Signed)
Remote pacemaker transmission.   

## 2020-07-27 DIAGNOSIS — I10 Essential (primary) hypertension: Secondary | ICD-10-CM | POA: Diagnosis not present

## 2020-07-27 DIAGNOSIS — I480 Paroxysmal atrial fibrillation: Secondary | ICD-10-CM | POA: Diagnosis not present

## 2020-07-27 DIAGNOSIS — K219 Gastro-esophageal reflux disease without esophagitis: Secondary | ICD-10-CM | POA: Diagnosis not present

## 2020-07-27 DIAGNOSIS — E7800 Pure hypercholesterolemia, unspecified: Secondary | ICD-10-CM | POA: Diagnosis not present

## 2020-07-27 DIAGNOSIS — N183 Chronic kidney disease, stage 3 unspecified: Secondary | ICD-10-CM | POA: Diagnosis not present

## 2020-07-27 DIAGNOSIS — M19049 Primary osteoarthritis, unspecified hand: Secondary | ICD-10-CM | POA: Diagnosis not present

## 2020-08-27 DIAGNOSIS — H16211 Exposure keratoconjunctivitis, right eye: Secondary | ICD-10-CM | POA: Diagnosis not present

## 2020-08-27 DIAGNOSIS — H0102A Squamous blepharitis right eye, upper and lower eyelids: Secondary | ICD-10-CM | POA: Diagnosis not present

## 2020-08-27 DIAGNOSIS — H0102B Squamous blepharitis left eye, upper and lower eyelids: Secondary | ICD-10-CM | POA: Diagnosis not present

## 2020-09-03 ENCOUNTER — Other Ambulatory Visit: Payer: Self-pay

## 2020-09-03 ENCOUNTER — Ambulatory Visit (INDEPENDENT_AMBULATORY_CARE_PROVIDER_SITE_OTHER): Payer: Medicare Other | Admitting: Internal Medicine

## 2020-09-03 VITALS — BP 112/64 | HR 78 | Ht 61.0 in | Wt 157.4 lb

## 2020-09-03 DIAGNOSIS — I480 Paroxysmal atrial fibrillation: Secondary | ICD-10-CM | POA: Diagnosis not present

## 2020-09-03 DIAGNOSIS — I4892 Unspecified atrial flutter: Secondary | ICD-10-CM

## 2020-09-03 NOTE — Patient Instructions (Signed)

## 2020-09-03 NOTE — Progress Notes (Signed)
Patient Care Team: Daisy Floro, MD as PCP - General (Family Medicine) Debbe Odea, MD as PCP - Cardiology (Cardiology)   HPI  Danielle Colon is a 87 y.o. female Seen in follow-up from hospital presenting with syncope, found to be in atrial fibrillation and sinus bradycardia. She underwent pacing-Medtronic 9/21.Marland Kitchen    Anticoagulated with apixaban; no bleeding  No lightheadedness  The patient denies chest pain, shortness of breath, nocturnal dyspnea, orthopnea or peripheral edema.  There have been no palpitations, lightheadedness or syncope.          Date Cr K Hgb  7/21 1.15 3.7 13.   5/22 0.98 4.6 11.8   DATE TEST EF   7/15 Echo   60-65 %   7/21 Echo   60 %         Thromboembolic risk factors ( age  -2, HTN-1, TIA/CVA-2? , Gender-1) for a CHADSVASc Score of >=4+   Records and Results Reviewed   Past Medical History:  Diagnosis Date   Atrial fibrillation (HCC)    Bell's palsy 2009;    on the right   Brain TIA    pt denies this hx on 07/18/2013; "they thought it was a stroke but dr said later that it was Bell's Palsy"   Cerebrovascular disease    GERD (gastroesophageal reflux disease)    Hepatitis    "don't remember what kind; had it ~ 3 times when I was younger; last time was in the 1990's"   Hyperlipidemia    Hypertension    Pancreatitis     Past Surgical History:  Procedure Laterality Date   APPENDECTOMY  ~ 1971   CATARACT EXTRACTION Right    CHOLECYSTECTOMY  ~ 1971   PACEMAKER IMPLANT N/A 09/27/2019   Procedure: PACEMAKER IMPLANT;  Surgeon: Duke Salvia, MD;  Location: [facility] INVASIVE CV LAB;  Service: Cardiovascular;  Laterality: N/A;   TONSILLECTOMY  1949    Current Meds  Medication Sig   acetaminophen (TYLENOL) 325 MG tablet Take 2 tablets (650 mg total) by mouth every 6 (six) hours as needed for mild pain (or Fever >/= 101).   ascorbic acid (VITAMIN C) 500 MG tablet Take 500 mg by mouth daily.   cholecalciferol (VITAMIN D3) 25  MCG (1000 UNIT) tablet Take 1,000 Units by mouth at bedtime.    CREON 24000-76000 units CPEP Take 1 capsule by mouth 3 (three) times daily.   ELIQUIS 2.5 MG TABS tablet Take 2.5 mg by mouth 2 (two) times daily.   pantoprazole (PROTONIX) 40 MG tablet Take 40 mg by mouth daily.    Allergies  Allergen Reactions   Tessalon Perles [Benzonatate] Other (See Comments)    unknown   Neurontin [Gabapentin] Other (See Comments)    unknown   Zantac [Ranitidine Hcl] Other (See Comments)    GI upset      Review of Systems negative except from HPI and PMH  Physical Exam BP 112/64   Pulse 78   Ht 5\' 1"  (1.549 m)   Wt 157 lb 6.4 oz (71.4 kg)   SpO2 94%   BMI 29.74 kg/m  Well developed and well nourished in no acute distress HENT normal x right ptosis  neck supple with JVP-flat Clear Device pocket well healed; without hematoma or erythema.  There is no tethering  Regular rate and rhythm, no / murmur Abd-soft with active BS No Clubbing cyanosis   edema Skin-warm and dry A & Oriented  Grossly normal sensory  and motor function  ECG atrial pacing at 78 Intervals 27/08/38 Poor R wave progression     CrCl cannot be calculated (Patient's most recent lab result is older than the maximum 21 days allowed.).   Assessment and  Plan  Atrial flutter RVR  14% >>17 %  CVR  Sinus Brady/first-degree AV block  Syncope  HTN  Pacemaker Medtronic   Continues to have episodes of atrial fibrillation, slightly increased burden as noted above.  Heart rate control seems relatively reasonable.  In the absence of lightheadedness, we will continue her diltiazem at 180 mg a day having increased at the last visit.  In the event of lightheadedness and symptoms we can probably decrease it some.  Continue Eliquis 2.5 mg twice daily.  This is an slightly low dose given her weight at 70 kg      Current medicines are reviewed at length with the patient today .  The patient does not  have concerns regarding  medicines.

## 2020-09-09 DIAGNOSIS — E7800 Pure hypercholesterolemia, unspecified: Secondary | ICD-10-CM | POA: Diagnosis not present

## 2020-09-09 DIAGNOSIS — K219 Gastro-esophageal reflux disease without esophagitis: Secondary | ICD-10-CM | POA: Diagnosis not present

## 2020-09-09 DIAGNOSIS — N183 Chronic kidney disease, stage 3 unspecified: Secondary | ICD-10-CM | POA: Diagnosis not present

## 2020-09-09 DIAGNOSIS — M19049 Primary osteoarthritis, unspecified hand: Secondary | ICD-10-CM | POA: Diagnosis not present

## 2020-09-09 DIAGNOSIS — I480 Paroxysmal atrial fibrillation: Secondary | ICD-10-CM | POA: Diagnosis not present

## 2020-09-09 DIAGNOSIS — I10 Essential (primary) hypertension: Secondary | ICD-10-CM | POA: Diagnosis not present

## 2020-09-25 ENCOUNTER — Ambulatory Visit (INDEPENDENT_AMBULATORY_CARE_PROVIDER_SITE_OTHER): Payer: Medicare Other

## 2020-09-25 DIAGNOSIS — I495 Sick sinus syndrome: Secondary | ICD-10-CM

## 2020-09-25 LAB — CUP PACEART REMOTE DEVICE CHECK
Battery Remaining Longevity: 161 mo
Battery Voltage: 3.1 V
Brady Statistic AP VP Percent: 0.04 %
Brady Statistic AP VS Percent: 68.36 %
Brady Statistic AS VP Percent: 0.01 %
Brady Statistic AS VS Percent: 31.61 %
Brady Statistic RA Percent Paced: 62.88 %
Brady Statistic RV Percent Paced: 1.41 %
Date Time Interrogation Session: 20220908194403
Implantable Lead Implant Date: 20210910
Implantable Lead Implant Date: 20210910
Implantable Lead Location: 753859
Implantable Lead Location: 753860
Implantable Lead Model: 5076
Implantable Lead Model: 5076
Implantable Pulse Generator Implant Date: 20210910
Lead Channel Impedance Value: 323 Ohm
Lead Channel Impedance Value: 399 Ohm
Lead Channel Impedance Value: 437 Ohm
Lead Channel Impedance Value: 494 Ohm
Lead Channel Pacing Threshold Amplitude: 0.625 V
Lead Channel Pacing Threshold Amplitude: 0.625 V
Lead Channel Pacing Threshold Pulse Width: 0.4 ms
Lead Channel Pacing Threshold Pulse Width: 0.4 ms
Lead Channel Sensing Intrinsic Amplitude: 1 mV
Lead Channel Sensing Intrinsic Amplitude: 1 mV
Lead Channel Sensing Intrinsic Amplitude: 6.375 mV
Lead Channel Sensing Intrinsic Amplitude: 6.375 mV
Lead Channel Setting Pacing Amplitude: 1.5 V
Lead Channel Setting Pacing Amplitude: 2.5 V
Lead Channel Setting Pacing Pulse Width: 0.4 ms
Lead Channel Setting Sensing Sensitivity: 1.2 mV

## 2020-09-30 NOTE — Progress Notes (Signed)
Remote pacemaker transmission.   

## 2020-10-23 DIAGNOSIS — Z0000 Encounter for general adult medical examination without abnormal findings: Secondary | ICD-10-CM | POA: Diagnosis not present

## 2020-10-23 DIAGNOSIS — R899 Unspecified abnormal finding in specimens from other organs, systems and tissues: Secondary | ICD-10-CM | POA: Diagnosis not present

## 2020-11-02 IMAGING — CR DG CHEST 2V
2 series · 2 of 2 positions shown · non-contrast
Comparison: Radiograph 03/25/2018, CTA chest 07/18/2013

CLINICAL DATA: Chest discomfort

EXAM:
CHEST - 2 VIEW

[chest pa]
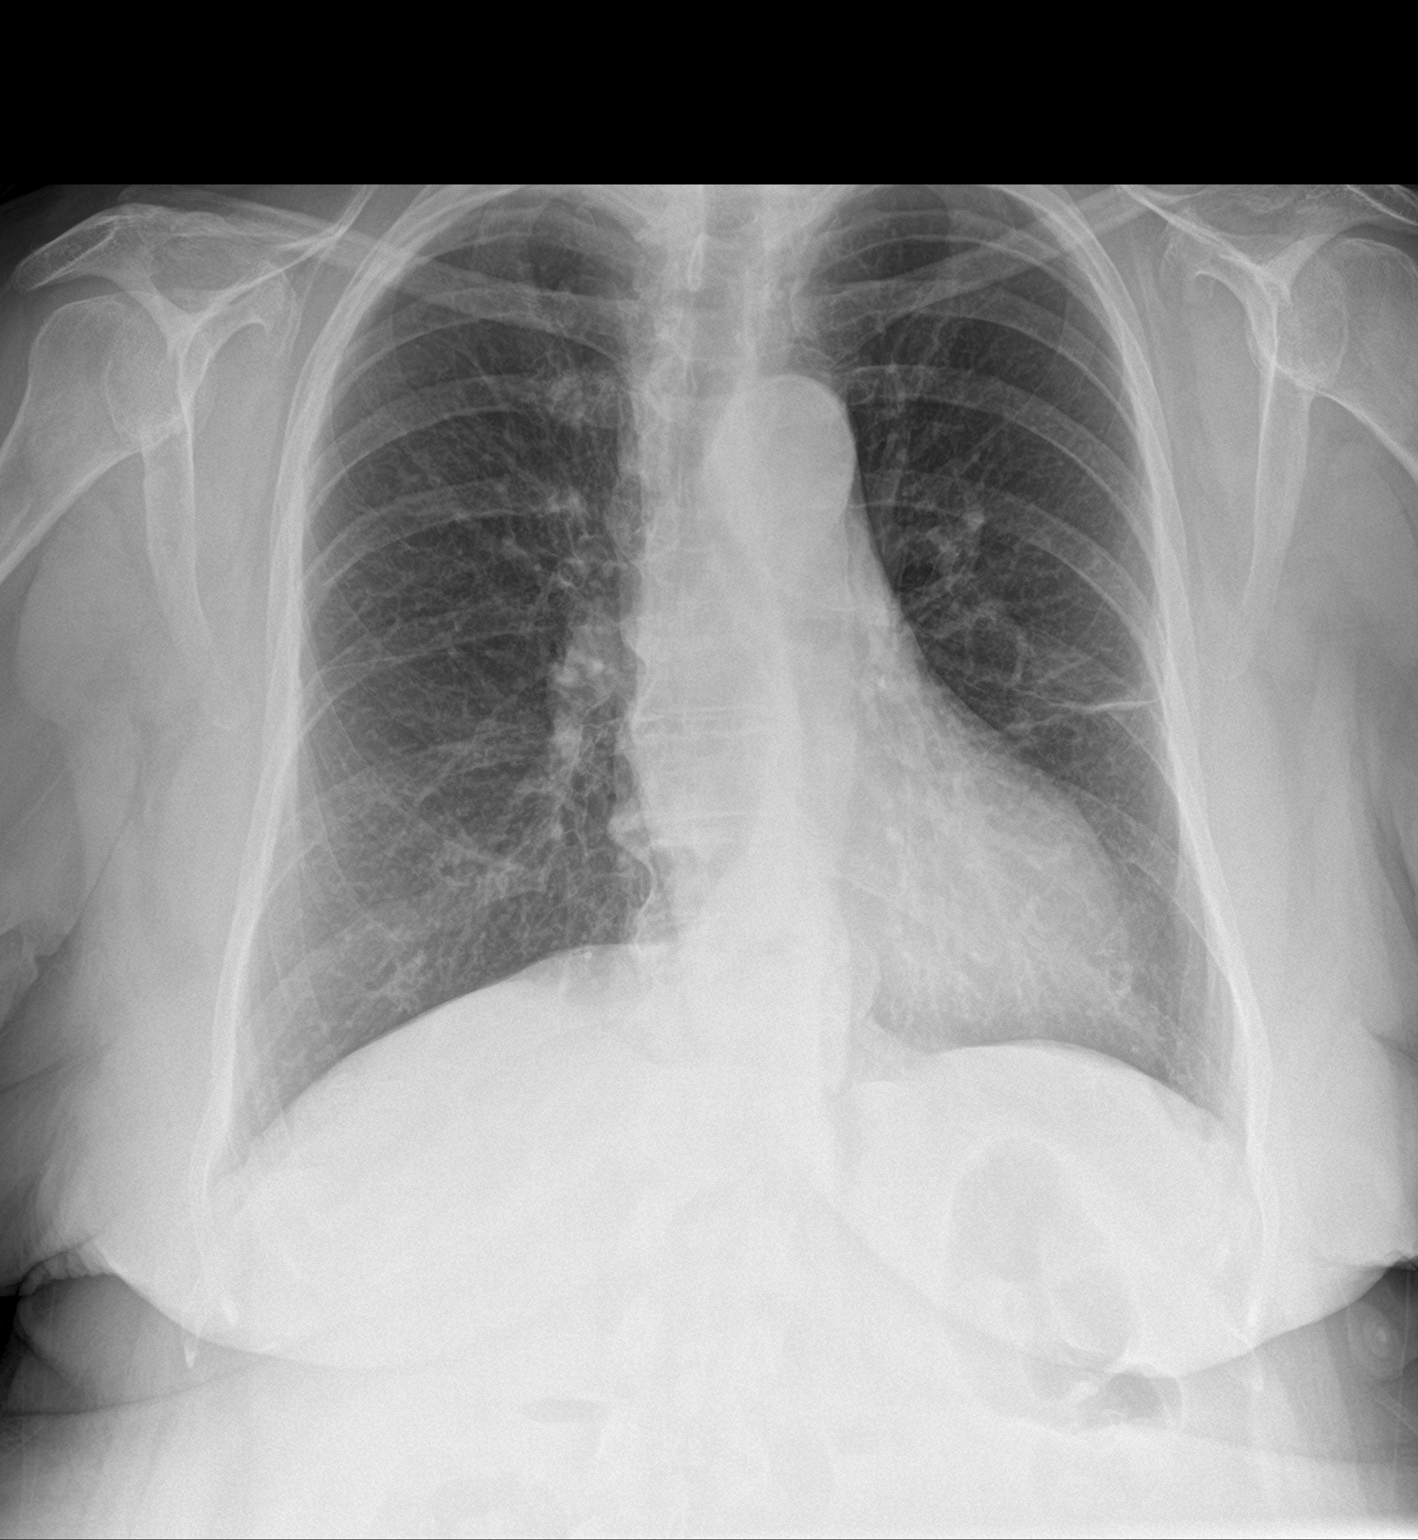

[chest lat]
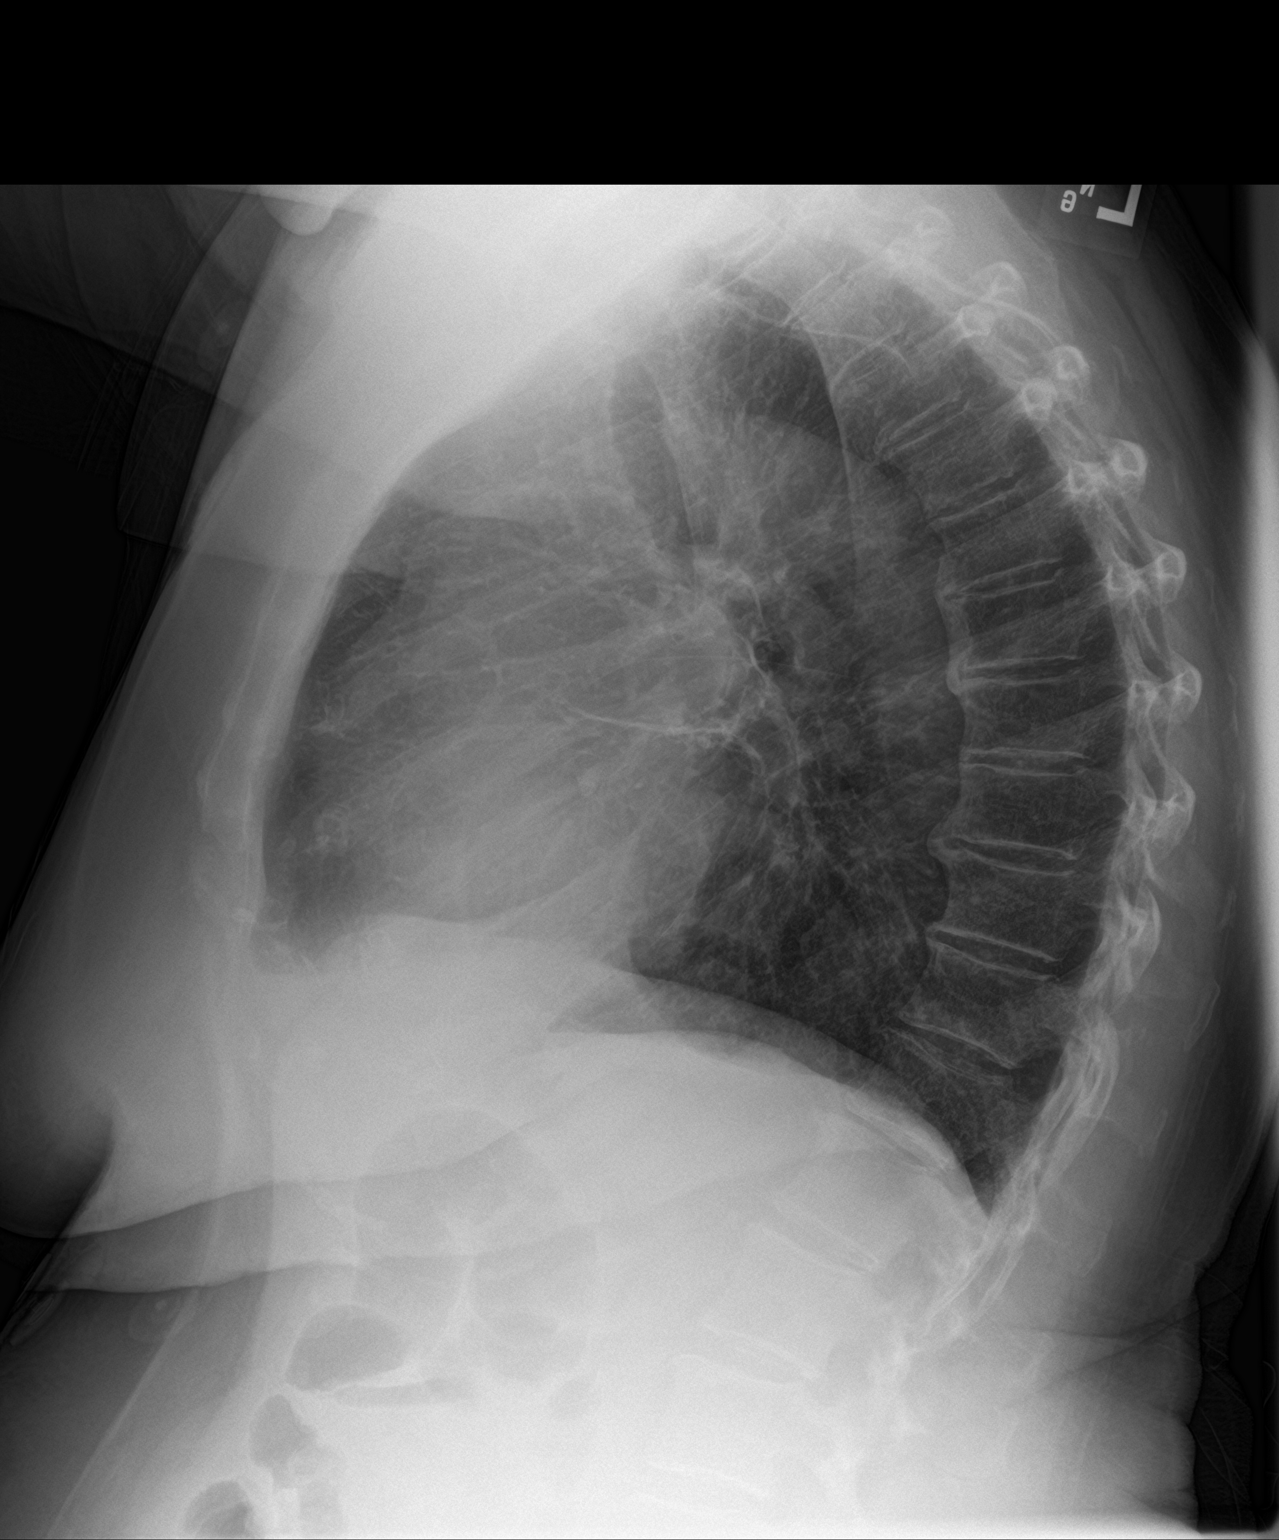

[2 of 2 positions shown; findings below may reference images not displayed]

FINDINGS: Stable bandlike opacity in the left mid lung compatible with
scarring. No consolidation, features of edema, pneumothorax, or
effusion. The aorta is calcified. The remaining cardiomediastinal
contours are unremarkable. No acute osseous or soft tissue
abnormality. Degenerative changes are present in the imaged spine
and shoulders.
IMPRESSION: Stable scarring in the left mid lung. No acute cardiopulmonary
abnormality.

Aortic Atherosclerosis ([id number].L).

## 2020-11-09 DIAGNOSIS — K219 Gastro-esophageal reflux disease without esophagitis: Secondary | ICD-10-CM | POA: Diagnosis not present

## 2020-11-09 DIAGNOSIS — I480 Paroxysmal atrial fibrillation: Secondary | ICD-10-CM | POA: Diagnosis not present

## 2020-11-09 DIAGNOSIS — E7800 Pure hypercholesterolemia, unspecified: Secondary | ICD-10-CM | POA: Diagnosis not present

## 2020-11-09 DIAGNOSIS — M19049 Primary osteoarthritis, unspecified hand: Secondary | ICD-10-CM | POA: Diagnosis not present

## 2020-11-09 DIAGNOSIS — I10 Essential (primary) hypertension: Secondary | ICD-10-CM | POA: Diagnosis not present

## 2020-11-09 DIAGNOSIS — N183 Chronic kidney disease, stage 3 unspecified: Secondary | ICD-10-CM | POA: Diagnosis not present

## 2020-12-08 ENCOUNTER — Emergency Department (HOSPITAL_BASED_OUTPATIENT_CLINIC_OR_DEPARTMENT_OTHER): Payer: Medicare Other

## 2020-12-08 ENCOUNTER — Encounter (HOSPITAL_BASED_OUTPATIENT_CLINIC_OR_DEPARTMENT_OTHER): Payer: Self-pay

## 2020-12-08 ENCOUNTER — Observation Stay (HOSPITAL_BASED_OUTPATIENT_CLINIC_OR_DEPARTMENT_OTHER)
Admission: EM | Admit: 2020-12-08 | Discharge: 2020-12-09 | Disposition: A | Discharge status: Home / Self Care | Payer: Medicare Other | Attending: Internal Medicine | Admitting: Internal Medicine

## 2020-12-08 ENCOUNTER — Other Ambulatory Visit: Payer: Self-pay

## 2020-12-08 DIAGNOSIS — R001 Bradycardia, unspecified: Secondary | ICD-10-CM | POA: Diagnosis not present

## 2020-12-08 DIAGNOSIS — Z950 Presence of cardiac pacemaker: Secondary | ICD-10-CM | POA: Insufficient documentation

## 2020-12-08 DIAGNOSIS — R0602 Shortness of breath: Secondary | ICD-10-CM | POA: Insufficient documentation

## 2020-12-08 DIAGNOSIS — Z20822 Contact with and (suspected) exposure to covid-19: Secondary | ICD-10-CM | POA: Diagnosis not present

## 2020-12-08 DIAGNOSIS — I495 Sick sinus syndrome: Secondary | ICD-10-CM | POA: Diagnosis not present

## 2020-12-08 DIAGNOSIS — Z79899 Other long term (current) drug therapy: Secondary | ICD-10-CM | POA: Insufficient documentation

## 2020-12-08 DIAGNOSIS — N1831 Chronic kidney disease, stage 3a: Secondary | ICD-10-CM | POA: Diagnosis not present

## 2020-12-08 DIAGNOSIS — R7989 Other specified abnormal findings of blood chemistry: Secondary | ICD-10-CM | POA: Diagnosis present

## 2020-12-08 DIAGNOSIS — R42 Dizziness and giddiness: Secondary | ICD-10-CM | POA: Diagnosis not present

## 2020-12-08 DIAGNOSIS — Z7901 Long term (current) use of anticoagulants: Secondary | ICD-10-CM | POA: Diagnosis not present

## 2020-12-08 DIAGNOSIS — I480 Paroxysmal atrial fibrillation: Secondary | ICD-10-CM | POA: Insufficient documentation

## 2020-12-08 DIAGNOSIS — I129 Hypertensive chronic kidney disease with stage 1 through stage 4 chronic kidney disease, or unspecified chronic kidney disease: Secondary | ICD-10-CM | POA: Insufficient documentation

## 2020-12-08 LAB — COMPREHENSIVE METABOLIC PANEL
ALT: 22 U/L (ref 0–44)
AST: 18 U/L (ref 15–41)
Albumin: 4.3 g/dL (ref 3.5–5.0)
Alkaline Phosphatase: 108 U/L (ref 38–126)
Anion gap: 9 (ref 5–15)
BUN: 21 mg/dL (ref 8–23)
CO2: 28 mmol/L (ref 22–32)
Calcium: 10.7 mg/dL — ABNORMAL HIGH (ref 8.9–10.3)
Chloride: 102 mmol/L (ref 98–111)
Creatinine, Ser: 1.13 mg/dL — ABNORMAL HIGH (ref 0.44–1.00)
GFR, Estimated: 47 mL/min — ABNORMAL LOW (ref >60)
Glucose, Bld: 105 mg/dL — ABNORMAL HIGH (ref 70–99)
Potassium: 4 mmol/L (ref 3.5–5.1)
Sodium: 139 mmol/L (ref 135–145)
Total Bilirubin: 0.7 mg/dL (ref 0.3–1.2)
Total Protein: 7.6 g/dL (ref 6.5–8.1)

## 2020-12-08 LAB — CBC WITH DIFFERENTIAL/PLATELET
Abs Immature Granulocytes: 0.02 10*3/uL (ref 0.00–0.07)
Basophils Absolute: 0.1 10*3/uL (ref 0.0–0.1)
Basophils Relative: 1 %
Eosinophils Absolute: 0.2 10*3/uL (ref 0.0–0.5)
Eosinophils Relative: 2 %
HCT: 39.2 % (ref 36.0–46.0)
Hemoglobin: 13 g/dL (ref 12.0–15.0)
Immature Granulocytes: 0 %
Lymphocytes Relative: 17 %
Lymphs Abs: 1.3 10*3/uL (ref 0.7–4.0)
MCH: 30.2 pg (ref 26.0–34.0)
MCHC: 33.2 g/dL (ref 30.0–36.0)
MCV: 91 fL (ref 80.0–100.0)
Monocytes Absolute: 0.6 10*3/uL (ref 0.1–1.0)
Monocytes Relative: 8 %
Neutro Abs: 5.5 10*3/uL (ref 1.7–7.7)
Neutrophils Relative %: 72 %
Platelets: 177 10*3/uL (ref 150–400)
RBC: 4.31 MIL/uL (ref 3.87–5.11)
RDW: 13.1 % (ref 11.5–15.5)
WBC: 7.6 10*3/uL (ref 4.0–10.5)
nRBC: 0 % (ref 0.0–0.2)

## 2020-12-08 LAB — RESP PANEL BY RT-PCR (FLU A&B, COVID) ARPGX2
Influenza A by PCR: NEGATIVE
Influenza B by PCR: NEGATIVE
SARS Coronavirus 2 by RT PCR: NEGATIVE

## 2020-12-08 LAB — BRAIN NATRIURETIC PEPTIDE: B Natriuretic Peptide: 61.9 pg/mL (ref 0.0–100.0)

## 2020-12-08 LAB — URINALYSIS, ROUTINE W REFLEX MICROSCOPIC
Bilirubin Urine: NEGATIVE
Glucose, UA: NEGATIVE mg/dL
Hgb urine dipstick: NEGATIVE
Ketones, ur: NEGATIVE mg/dL
Leukocytes,Ua: NEGATIVE
Nitrite: NEGATIVE
Protein, ur: NEGATIVE mg/dL
Specific Gravity, Urine: 1.007 (ref 1.005–1.030)
pH: 7 (ref 5.0–8.0)

## 2020-12-08 MED ORDER — ONDANSETRON HCL 4 MG/2ML IJ SOLN: 4.0000 mg | Freq: Four times a day (QID) | INTRAMUSCULAR | Status: DC | PRN

## 2020-12-08 MED ORDER — MECLIZINE HCL 25 MG PO TABS
12.5000 mg | ORAL_TABLET | Freq: Once | ORAL | Status: AC
  Administered 2020-12-08: 12.5 mg via ORAL
  Filled 2020-12-08: qty 1

## 2020-12-08 MED ORDER — APIXABAN 2.5 MG PO TABS
2.5000 mg | ORAL_TABLET | Freq: Two times a day (BID) | ORAL | Status: DC
  Administered 2020-12-08 – 2020-12-09 (×3): 2.5 mg via ORAL
  Filled 2020-12-08 (×3): qty 1

## 2020-12-08 MED ORDER — PANTOPRAZOLE SODIUM 40 MG PO TBEC
40.0000 mg | DELAYED_RELEASE_TABLET | Freq: Every day | ORAL | Status: DC
  Administered 2020-12-08 – 2020-12-09 (×2): 40 mg via ORAL
  Filled 2020-12-08 (×2): qty 1

## 2020-12-08 MED ORDER — ACETAMINOPHEN 325 MG PO TABS
650.0000 mg | ORAL_TABLET | Freq: Four times a day (QID) | ORAL | Status: DC | PRN
  Administered 2020-12-09: 650 mg via ORAL
  Filled 2020-12-08: qty 2

## 2020-12-08 MED ORDER — SODIUM CHLORIDE 0.9 % IV BOLUS
500.0000 mL | Freq: Once | INTRAVENOUS | Status: AC
  Administered 2020-12-08: 500 mL via INTRAVENOUS

## 2020-12-08 MED ORDER — MECLIZINE HCL 25 MG PO TABS
25.0000 mg | ORAL_TABLET | Freq: Two times a day (BID) | ORAL | Status: DC | PRN
  Filled 2020-12-08 (×3): qty 1

## 2020-12-08 MED ORDER — ACETAMINOPHEN 650 MG RE SUPP: 650.0000 mg | Freq: Four times a day (QID) | RECTAL | Status: DC | PRN

## 2020-12-08 MED ORDER — ONDANSETRON HCL 4 MG PO TABS: 4.0000 mg | ORAL_TABLET | Freq: Four times a day (QID) | ORAL | Status: DC | PRN

## 2020-12-08 MED ORDER — ALBUTEROL SULFATE (2.5 MG/3ML) 0.083% IN NEBU: 2.5000 mg | INHALATION_SOLUTION | Freq: Four times a day (QID) | RESPIRATORY_TRACT | Status: DC | PRN

## 2020-12-08 MED ORDER — METOCLOPRAMIDE HCL 5 MG/ML IJ SOLN
5.0000 mg | Freq: Once | INTRAMUSCULAR | Status: AC
  Administered 2020-12-08: 5 mg via INTRAVENOUS
  Filled 2020-12-08: qty 2

## 2020-12-08 MED ORDER — SODIUM CHLORIDE 0.9% FLUSH
3.0000 mL | Freq: Two times a day (BID) | INTRAVENOUS | Status: DC
  Administered 2020-12-08 – 2020-12-09 (×3): 3 mL via INTRAVENOUS

## 2020-12-08 MED ORDER — DILTIAZEM HCL ER COATED BEADS 180 MG PO CP24
180.0000 mg | ORAL_CAPSULE | Freq: Every day | ORAL | Status: DC
  Administered 2020-12-08 – 2020-12-09 (×2): 180 mg via ORAL
  Filled 2020-12-08 (×2): qty 1

## 2020-12-08 NOTE — ED Triage Notes (Signed)
Having dizzy spells since Friday notice bilateral feet swelling today

## 2020-12-08 NOTE — Plan of Care (Signed)
  Problem: Education: Goal: Knowledge of General Education information will improve Description: Including pain rating scale, medication(s)/side effects and non-pharmacologic comfort measures Outcome: Not Progressing   Problem: Clinical Measurements: Goal: Ability to maintain clinical measurements within normal limits will improve Outcome: Not Progressing Goal: Will remain free from infection Outcome: Not Progressing Goal: Diagnostic test results will improve Outcome: Not Progressing Goal: Respiratory complications will improve Outcome: Not Progressing Goal: Cardiovascular complication will be avoided Outcome: Not Progressing   Problem: Activity: Goal: Risk for activity intolerance will decrease Outcome: Not Progressing   Problem: Nutrition: Goal: Adequate nutrition will be maintained Outcome: Not Progressing   Problem: Coping: Goal: Level of anxiety will decrease Outcome: Not Progressing   Problem: Elimination: Goal: Will not experience complications related to bowel motility Outcome: Not Progressing Goal: Will not experience complications related to urinary retention Outcome: Not Progressing   Problem: Pain Managment: Goal: General experience of comfort will improve Outcome: Not Progressing   Problem: Safety: Goal: Ability to remain free from injury will improve Outcome: Not Progressing   Problem: Skin Integrity: Goal: Risk for impaired skin integrity will decrease Outcome: Not Progressing

## 2020-12-08 NOTE — ED Notes (Signed)
Care Handoff/Report given to [facility] at this Time. All Questions Answered. 

## 2020-12-08 NOTE — ED Notes (Signed)
[facility] at bedside 

## 2020-12-08 NOTE — ED Notes (Signed)
Patient transported to CT 

## 2020-12-08 NOTE — ED Notes (Signed)
Care handoff report given to Ms [facility] on 5W at [facility]

## 2020-12-08 NOTE — ED Provider Notes (Signed)
[facility]ER[facility] DEPT Provider Note   CSN: [id number] Arrival date & time: 12/08/20  [id number]     History Chief Complaint  Patient presents with   Dizziness    With feet swelling    Danielle Colon is a 87 y.o. female.   Dizziness Associated symptoms: no chest pain, no headaches, no shortness of breath and no weakness   Patient presents with dizziness.  Feels if the room is spinning.  Has been episodes on and off since Friday.  Today is Tuesday.  No headache.  Mild nausea.  Worse moving her head and difficulty standing due to it.  No numbness weakness.  Some difficulty walking due to the unsteadiness.  Has not had episodes like this before.  History of atrial fibrillation.  Is on anticoagulation.  Also worsening swelling of her lower extremities.  No fevers or chills.  No coughing.  No abdominal pain.  No headache.    Past Medical History:  Diagnosis Date   Atrial fibrillation ([place])    Bell's palsy 2009;    on the right   Brain TIA    pt denies this hx on 07/18/2013; "they thought it was a stroke but dr said later that it was Bell's Palsy"   Cerebrovascular disease    GERD (gastroesophageal reflux disease)    Hepatitis    "don't remember what kind; had it ~ 3 times when I was younger; last time was in the 1990's"   Hyperlipidemia    Hypertension    Pancreatitis     Patient Active Problem List   Diagnosis Date Noted   Acute pancreatitis 05/22/2020   Sinus bradycardia 03/31/2020   Pacemaker 01/05/2020   Atrial flutter with rapid ventricular response ([place]) 01/05/2020   Syncope 07/23/2019   Atrial fibrillation ([place]) 07/22/2019   Atrial fibrillation with RVR ([place]) 07/22/2019   Syncope and collapse 07/22/2019   Hypokalemia 03/01/2019   Anemia 03/01/2019   Pancreatitis 02/28/2019   Hyperlipidemia 02/28/2019   Obesity (BMI 30.0-34.9) 02/28/2019   Hypoxia 02/28/2019   GERD (gastroesophageal reflux disease) 07/19/2013   Urinary incontinence 07/19/2013    Lung nodule seen on imaging study 07/19/2013   Benign hypertension 07/18/2013   History of Bell's palsy 06/27/2013    Past Surgical History:  Procedure Laterality Date   APPENDECTOMY  ~ 1971   CATARACT EXTRACTION Right    CHOLECYSTECTOMY  ~ [place] N/A 09/27/2019   Procedure: PACEMAKER IMPLANT;  Surgeon: Deboraha Sprang, MD;  Location: [facility];  Service: Cardiovascular;  Laterality: N/A;   TONSILLECTOMY  1949     OB History   No obstetric history on file.     Family History  Problem Relation Age of Onset   Pneumonia Mother        old age   Heart attack Father    Diabetes Father    Diabetes Sister    Diabetes Brother    Cancer Sister    Diabetes Sister    Diabetes Sister     Social History   Tobacco Use   Smoking status: Never   Smokeless tobacco: Never  Vaping Use   Vaping Use: Never used  Substance Use Topics   Alcohol use: No   Drug use: No    Home Medications Prior to Admission medications   Medication Sig Start Date End Date Taking? Authorizing Provider  acetaminophen (TYLENOL) 325 MG tablet Take 2 tablets (650 mg total) by mouth every 6 (six) hours as  needed for mild pain (or Fever >/= 101). 03/05/19   Myrlene Broker, MD  ascorbic acid (VITAMIN C) 500 MG tablet Take 500 mg by mouth daily.    [provider]  cholecalciferol (VITAMIN D3) 25 MCG (1000 UNIT) tablet Take 1,000 Units by mouth at bedtime.     [provider]  CREON 24000-76000 units CPEP Take 1 capsule by mouth 3 (three) times daily. 04/23/20   [provider]  diltiazem (CARDIZEM CD) 180 MG 24 hr capsule Take 1 capsule (180 mg total) by mouth daily. 01/08/20 04/07/20  Duke Salvia, MD  ELIQUIS 2.5 MG TABS tablet Take 2.5 mg by mouth 2 (two) times daily. 05/19/20   [provider]  pantoprazole (PROTONIX) 40 MG tablet Take 40 mg by mouth daily. 05/01/20   [provider]  losartan (COZAAR) 50 MG tablet Take 1 tablet (50 mg  total) by mouth daily. 07/24/19 10/11/19  Glade Lloyd, MD    Allergies    Tessalon perles [benzonatate], Neurontin [gabapentin], and Zantac [ranitidine hcl]  Review of Systems   Review of Systems  Constitutional:  Negative for appetite change.  HENT:  Negative for congestion.   Respiratory:  Negative for shortness of breath.   Cardiovascular:  Positive for leg swelling. Negative for chest pain.  Gastrointestinal:  Negative for abdominal pain.  Genitourinary:  Negative for flank pain.  Musculoskeletal:  Negative for back pain.  Skin:  Negative for rash.  Neurological:  Positive for dizziness. Negative for weakness and headaches.   Physical Exam Updated Vital Signs BP (!) 145/77 (BP Location: Right Arm)   Pulse 73   Temp 98.5 F (36.9 C) (Oral)   Resp 20   Ht 5\' 1"  (1.549 m)   Wt 69.9 kg   SpO2 96%   BMI 29.10 kg/m   Physical Exam Vitals and nursing note reviewed.  Constitutional:      Appearance: Normal appearance.  HENT:     Head: Atraumatic.     Right Ear: Tympanic membrane normal.     Left Ear: Tympanic membrane normal.     Mouth/Throat:     Mouth: Mucous membranes are moist.  Eyes:     Extraocular Movements: Extraocular movements intact.     Pupils: Pupils are equal, round, and reactive to light.     Comments: Chronic right eyelid droop.  Cardiovascular:     Rate and Rhythm: Regular rhythm.  Pulmonary:     Breath sounds: No wheezing or rhonchi.  Abdominal:     Tenderness: There is no abdominal tenderness.  Musculoskeletal:        General: No tenderness.     Cervical back: Neck supple.  Skin:    General: Skin is warm.     Capillary Refill: Capillary refill takes less than 2 seconds.  Neurological:     Mental Status: She is oriented to person, place, and time.     Comments: Face symmetric.  Eye movements intact.  Dizziness with turning her head.  Difficulty sitting up because she feels that the room is spinning.  Finger-nose intact bilaterally.  No  nystagmus    ED Results / Procedures / Treatments   Labs (all labs ordered are listed, but only abnormal results are displayed) Labs Reviewed  COMPREHENSIVE METABOLIC PANEL - Abnormal; Notable for the following components:      Result Value   Glucose, Bld 105 (*)    Creatinine, Ser 1.13 (*)    Calcium 10.7 (*)    GFR,  Estimated 47 (*)    All other components within normal limits  CBC WITH DIFFERENTIAL/PLATELET  BRAIN NATRIURETIC PEPTIDE  URINALYSIS, ROUTINE W REFLEX MICROSCOPIC    EKG EKG Interpretation  Date/Time:  Tuesday December 08 2020 08:52:36 EST Ventricular Rate:  76 PR Interval:  152 QRS Duration: 83 QT Interval:  400 QTC Calculation: 450 R Axis:   -44 Text Interpretation: Sinus or ectopic atrial rhythm Left axis deviation Abnormal R-wave progression, late transition Confirmed by Davonna Belling [phone]) on 12/08/2020 9:01:37 AM  Radiology CT Head Wo Contrast  Result Date: 12/08/2020 CLINICAL DATA:  Dizziness. EXAM: CT HEAD WITHOUT CONTRAST TECHNIQUE: Contiguous axial images were obtained from the base of the skull through the vertex without intravenous contrast. COMPARISON:  July 22, 2019. FINDINGS: Brain: No evidence of acute infarction, hemorrhage, hydrocephalus, extra-axial collection or mass lesion/mass effect. Vascular: No hyperdense vessel or unexpected calcification. Skull: Normal. Negative for fracture or focal lesion. Sinuses/Orbits: No acute finding. Other: None. IMPRESSION: No acute intracranial abnormality seen. Electronically Signed   By: Marijo Conception M.D.   On: 12/08/2020 09:37   DG Chest Portable 1 View  Result Date: 12/08/2020 CLINICAL DATA:  Edema, dizziness EXAM: PORTABLE CHEST 1 VIEW COMPARISON:  Chest radiograph 09/27/2018 FINDINGS: The left chest wall cardiac device and associated leads are stable. The cardiomediastinal silhouette is stable. There is no focal consolidation. There is no pulmonary edema. Linear opacity projecting over the left  midlung may reflect atelectasis or scar. There is no pleural effusion or pneumothorax. There is no acute osseous abnormality. IMPRESSION: No radiographic evidence of acute cardiopulmonary process. Electronically Signed   By: Valetta Mole M.D.   On: 12/08/2020 09:38    Procedures Procedures   Medications Ordered in ED Medications  sodium chloride 0.9 % bolus 500 mL (has no administration in time range)  meclizine (ANTIVERT) tablet 12.5 mg (has no administration in time range)    ED Course  I have reviewed the triage vital signs and the nursing notes.  Pertinent labs & imaging results that were available during my care of the patient were reviewed by me and considered in my medical decision making (see chart for details).    MDM Rules/Calculators/A&P                           Patient presents with dizziness.  Began Friday.  Initially came and went but now constant.  Unsteady.  Worse if she turns her head or tries to get up.  States she was bouncing into the wall and had to hold onto it at home.  No lateralizing numbness or weakness.  No headache.  No ringing in her ears.  History of atrial fibrillation on anticoagulation.  Head CT reassuring.  Creatinine mildly elevated.  However does have worsening peripheral edema also.  I think patient is high risk for a central cause of the vertigo.  Patient is unsteady enough and lives alone I do not think she is good to go home at this point with the unsteadiness and anticoagulation.  Will need MRI for further evaluation of possible stroke.  With the unsteadiness I feel she would likely benefit from other treatments such as potential PT OT evaluation even if this is a peripheral cause.  Will discuss with hospitalist for possible admission. Not a candidate for tPA due to time of onset and relatively minor symptoms.  Do not feel she needs neurology evaluation until MRI has been done.  Will  discuss with hospitalist.   Vertigo Final Clinical Impression(s) /  ED Diagnoses Final diagnoses:  Vertigo    Rx / DC Orders ED Discharge Orders     None        Davonna Belling, MD 12/08/20 1037

## 2020-12-08 NOTE — H&P (Signed)
History and Physical    Danielle Colon [facility] [id number] DOB: Aug 14, 1930 DOA: 12/08/2020  Referring MD/NP/PA: Benjiman Core, MD PCP: Daisy Floro, MD  Consultants: Sherryl Manges, MD-cardiology Patient coming from: Transfer from [facility]  Chief Complaint: Dizziness  I have personally briefly reviewed patient's old medical records in [name]   HPI: Danielle Colon is a 87 y.o. female with medical history significant of hypertension, hyperlipidemia, paroxysmal atrial fibrillation on Eliquis, SSS s/p PPM, and GERD presents with a 4-day history of dizziness.  Patient lives with her daughter and at baseline ambulates without need of assistance.  Patient reports that symptoms started acutely while she was getting up.  Describes the dizziness feeling as her head spinning around causing her to feel off balance.  Denied any lightheadedness, loss of consciousness, or falls related to symptoms.  Symptoms went away, but returned and became more persistent as each day went on.  Patient was having to hold onto the walls in her home to get around.  Patient reports that she has had some leg swelling and mild shortness of breath.  She is not on any calcium supplement, but does take vitamin D supplementation.  ED Course: Upon admission into the emergency department patient was seen to be afebrile with blood pressure 133/67-151/72, and all other vital signs maintained.  CT scan of the head noted no acute abnormality.  Labs significant for creatinine 1.13 and calcium 10.7.  Chest x-ray noted no acute abnormality.  Patient had been given 500 mL of normal saline IV fluids and meclizine.  TRH called to admit.   Review of Systems  Constitutional:  Positive for malaise/fatigue. Negative for fever.  Eyes:  Negative for pain.  Respiratory:  Positive for shortness of breath.   Cardiovascular:  Positive for leg swelling. Negative for chest pain.  Gastrointestinal:  Positive for nausea.  Negative for abdominal pain.  Genitourinary:  Negative for dysuria and hematuria.  Musculoskeletal:  Negative for joint pain and myalgias.  Skin:  Negative for rash.  Neurological:  Positive for dizziness. Negative for focal weakness and loss of consciousness.  Psychiatric/Behavioral:  Negative for substance abuse. The patient does not have insomnia.    Past Medical History:  Diagnosis Date   Atrial fibrillation ([facility])    Bell's palsy 2009;    on the right   Brain TIA    pt denies this hx on 07/18/2013; "they thought it was a stroke but dr said later that it was Bell's Palsy"   Cerebrovascular disease    GERD (gastroesophageal reflux disease)    Hepatitis    "don't remember what kind; had it ~ 3 times when I was younger; last time was in the 1990's"   Hyperlipidemia    Hypertension    Pancreatitis     Past Surgical History:  Procedure Laterality Date   APPENDECTOMY  ~ 1971   CATARACT EXTRACTION Right    CHOLECYSTECTOMY  ~ 1971   PACEMAKER IMPLANT N/A 09/27/2019   Procedure: PACEMAKER IMPLANT;  Surgeon: Duke Salvia, MD;  Location: [facility];  Service: Cardiovascular;  Laterality: N/A;   TONSILLECTOMY  1949     reports that she has never smoked. She has never used smokeless tobacco. She reports that she does not drink alcohol and does not use drugs.  Allergies  Allergen Reactions   Tessalon Perles [Benzonatate] Other (See Comments)    unknown   Neurontin [Gabapentin] Other (See Comments)    unknown   Zantac [Ranitidine  Hcl] Other (See Comments)    GI upset    Family History  Problem Relation Age of Onset   Pneumonia Mother        old age   Heart attack Father    Diabetes Father    Diabetes Sister    Diabetes Brother    Cancer Sister    Diabetes Sister    Diabetes Sister     Prior to Admission medications   Medication Sig Start Date End Date Taking? Authorizing Provider  acetaminophen (TYLENOL) 325 MG tablet Take 2 tablets (650 mg total) by mouth  every 6 (six) hours as needed for mild pain (or Fever >/= 101). 03/05/19  Yes Hoyt Koch, MD  ascorbic acid (VITAMIN C) 500 MG tablet Take 500 mg by mouth daily.   Yes [provider]  cholecalciferol (VITAMIN D3) 25 MCG (1000 UNIT) tablet Take 1,000 Units by mouth at bedtime.    Yes [provider]  CREON 24000-76000 units CPEP Take 1 capsule by mouth 3 (three) times daily. 04/23/20  Yes [provider]  diltiazem (CARDIZEM CD) 180 MG 24 hr capsule Take 1 capsule (180 mg total) by mouth daily. 01/08/20 12/08/20 Yes Deboraha Sprang, MD  ELIQUIS 2.5 MG TABS tablet Take 2.5 mg by mouth 2 (two) times daily. 05/19/20  Yes [provider]  pantoprazole (PROTONIX) 40 MG tablet Take 40 mg by mouth daily. 05/01/20  Yes [provider]  losartan (COZAAR) 50 MG tablet Take 1 tablet (50 mg total) by mouth daily. 07/24/19 10/11/19  Aline August, MD    Physical Exam:  Constitutional: Elderly female currently in NAD, calm, comfortable Vitals:   12/08/20 0900 12/08/20 0945 12/08/20 1000 12/08/20 1015  BP:  136/80 133/67 (!) 145/77  Pulse: 82 68 70 73  Resp: 15 16 19 20   Temp:      TempSrc:      SpO2: 98% 97% 96% 96%  Weight:      Height:       Eyes: Right eyelid lag ENMT: Mucous membranes are moist. Posterior pharynx clear of any exudate or lesions.Normal dentition.  Neck: normal, supple, no masses, no thyromegaly Respiratory: clear to auscultation bilaterally, no wheezing, no crackles. Normal respiratory effort. No accessory muscle use.  Cardiovascular: Regular rate and rhythm, no murmurs / rubs / gallops.  Trace to 1+ pitting lower extremity edema. 2+ pedal pulses. No carotid bruits.  Abdomen: no tenderness, no masses palpated. No hepatosplenomegaly. Bowel sounds positive.  Musculoskeletal: no clubbing / cyanosis. No joint deformity upper and lower extremities. Good ROM, no contractures. Normal muscle tone.  Skin: no rashes, lesions, ulcers. No  induration Neurologic: CN 2-12 grossly intact. Sensation intact, DTR normal. Strength 5/5 in all 4.  Psychiatric: Normal judgment and insight. Alert and oriented x 3. Normal mood.     Labs on Admission: I have personally reviewed following labs and imaging studies  CBC: Recent Labs  Lab 12/08/20 0900  WBC 7.6  NEUTROABS 5.5  HGB 13.0  HCT 39.2  MCV 91.0  PLT 123XX123   Basic Metabolic Panel: Recent Labs  Lab 12/08/20 0900  NA 139  K 4.0  CL 102  CO2 28  GLUCOSE 105*  BUN 21  CREATININE 1.13*  CALCIUM 10.7*   GFR: Estimated Creatinine Clearance: 30.7 mL/min (A) (by C-G formula based on SCr of 1.13 mg/dL (H)). Liver Function Tests: Recent Labs  Lab 12/08/20 0900  AST 18  ALT 22  ALKPHOS 108  BILITOT 0.7  PROT 7.6  ALBUMIN 4.3   No results for input(s): LIPASE, AMYLASE in the last 168 hours. No results for input(s): AMMONIA in the last 168 hours. Coagulation Profile: No results for input(s): INR, PROTIME in the last 168 hours. Cardiac Enzymes: No results for input(s): CKTOTAL, CKMB, CKMBINDEX, TROPONINI in the last 168 hours. BNP (last 3 results) No results for input(s): PROBNP in the last 8760 hours. HbA1C: No results for input(s): HGBA1C in the last 72 hours. CBG: No results for input(s): GLUCAP in the last 168 hours. Lipid Profile: No results for input(s): CHOL, HDL, LDLCALC, TRIG, CHOLHDL, LDLDIRECT in the last 72 hours. Thyroid Function Tests: No results for input(s): TSH, T4TOTAL, FREET4, T3FREE, THYROIDAB in the last 72 hours. Anemia Panel: No results for input(s): VITAMINB12, FOLATE, FERRITIN, TIBC, IRON, RETICCTPCT in the last 72 hours. Urine analysis:    Component Value Date/Time   COLORURINE COLORLESS (A) 12/08/2020 0845   APPEARANCEUR CLEAR 12/08/2020 0845   LABSPEC 1.007 12/08/2020 0845   PHURINE 7.0 12/08/2020 0845   GLUCOSEU NEGATIVE 12/08/2020 0845   HGBUR NEGATIVE 12/08/2020 0845   BILIRUBINUR NEGATIVE 12/08/2020 0845   KETONESUR  NEGATIVE 12/08/2020 0845   PROTEINUR NEGATIVE 12/08/2020 0845   NITRITE NEGATIVE 12/08/2020 0845   LEUKOCYTESUR NEGATIVE 12/08/2020 0845   Sepsis Labs: Recent Results (from the past 240 hour(s))  Resp Panel by RT-PCR (Flu A&B, Covid) Nasopharyngeal Swab     Status: None   Collection Time: 12/08/20 10:46 AM   Specimen: Nasopharyngeal Swab; Nasopharyngeal(NP) swabs in vial transport medium  Result Value Ref Range Status   SARS Coronavirus 2 by RT PCR NEGATIVE NEGATIVE Final    Comment: (NOTE) SARS-CoV-2 target nucleic acids are NOT DETECTED.  The SARS-CoV-2 RNA is generally detectable in upper respiratory specimens during the acute phase of infection. The lowest concentration of SARS-CoV-2 viral copies this assay can detect is 138 copies/mL. A negative result does not preclude SARS-Cov-2 infection and should not be used as the sole basis for treatment or other patient management decisions. A negative result may occur with  improper specimen collection/handling, submission of specimen other than nasopharyngeal swab, presence of viral mutation(s) within the areas targeted by this assay, and inadequate number of viral copies(<138 copies/mL). A negative result must be combined with clinical observations, patient history, and epidemiological information. The expected result is Negative.  Fact Sheet for Patients:  EntrepreneurPulse.com.au  Fact Sheet for Healthcare Providers:  IncredibleEmployment.be  This test is no t yet approved or cleared by the [place] FDA and  has been authorized for detection and/or diagnosis of SARS-CoV-2 by FDA under an Emergency Use Authorization (EUA). This EUA will remain  in effect (meaning this test can be used) for the duration of the COVID-19 declaration under Section 564(b)(1) of the Act, 21 U.S.C.section 360bbb-3(b)(1), unless the authorization is terminated  or revoked sooner.       Influenza A by PCR  NEGATIVE NEGATIVE Final   Influenza B by PCR NEGATIVE NEGATIVE Final    Comment: (NOTE) The Xpert Xpress SARS-CoV-2/FLU/RSV plus assay is intended as an aid in the diagnosis of influenza from Nasopharyngeal swab specimens and should not be used as a sole basis for treatment. Nasal washings and aspirates are unacceptable for Xpert Xpress SARS-CoV-2/FLU/RSV testing.  Fact Sheet for Patients: EntrepreneurPulse.com.au  Fact Sheet for Healthcare Providers: IncredibleEmployment.be  This test is not yet approved or cleared by the [place] FDA and has been authorized for detection and/or diagnosis of SARS-CoV-2 by FDA under an  Emergency Use Authorization (EUA). This EUA will remain in effect (meaning this test can be used) for the duration of the COVID-19 declaration under Section 564(b)(1) of the Act, 21 U.S.C. section 360bbb-3(b)(1), unless the authorization is terminated or revoked.  Performed at [facility], [address], [place], [place] [address]      Radiological Exams on Admission: CT Head Wo Contrast  Result Date: 12/08/2020 CLINICAL DATA:  Dizziness. EXAM: CT HEAD WITHOUT CONTRAST TECHNIQUE: Contiguous axial images were obtained from the base of the skull through the vertex without intravenous contrast. COMPARISON:  July 22, 2019. FINDINGS: Brain: No evidence of acute infarction, hemorrhage, hydrocephalus, extra-axial collection or mass lesion/mass effect. Vascular: No hyperdense vessel or unexpected calcification. Skull: Normal. Negative for fracture or focal lesion. Sinuses/Orbits: No acute finding. Other: None. IMPRESSION: No acute intracranial abnormality seen. Electronically Signed   By: Marijo Conception M.D.   On: 12/08/2020 09:37   DG Chest Portable 1 View  Result Date: 12/08/2020 CLINICAL DATA:  Edema, dizziness EXAM: PORTABLE CHEST 1 VIEW COMPARISON:  Chest radiograph 09/27/2018 FINDINGS: The left chest wall  cardiac device and associated leads are stable. The cardiomediastinal silhouette is stable. There is no focal consolidation. There is no pulmonary edema. Linear opacity projecting over the left midlung may reflect atelectasis or scar. There is no pleural effusion or pneumothorax. There is no acute osseous abnormality. IMPRESSION: No radiographic evidence of acute cardiopulmonary process. Electronically Signed   By: Valetta Mole M.D.   On: 12/08/2020 09:38    EKG: Independently reviewed.  Sinus rhythm at 76 bpm  Assessment/Plan Vertigo: Acute.  Patient presents with complaints of the room spinning around her especially with any movement.  Denies any other focal weakness.  Initial CT scan of the brain negative for any acute abnormalities.  Transferred from med center for MRI of the brain.  Suspect BPPV more so than possible central cause of vertigo. -Admit to a medical telemetry bed -Up with assistance -Check thyroid -Check MRI of the brain without contrast rule out -Vestibular PT evaluation -Meclizine as needed for vertigo -Reglan IV as needed    Hypercalcemia: Acute.  Calcium mildly elevated 10.7 on admission.  Patient is on vitamin D supplementation. -Held Vit D  Paroxysmal atrial fibrillation on chronic anticoagulation: Patient appears to be in sinus rhythm at this time -Continue Cardizem and Eliquis  Sick sinus syndrome s/p pacemaker: Patient had a [employer] pacemaker placed in 2021.  She is followed by Dr. Caryl Comes of cardiology in outpatient setting.  Chronic kidney disease stage IIIa: Creatinine appears slightly elevated at 1.13 and baseline appears to be around 1.  This does not qualify as an acute kidney injury. -Continue to monitor kidney function  Heart failure with preserved EF: Stable. Patient with mild lower extremity edema, but no significant JVD or crackles heard on lung exam.  Patient appears to be euvolemic.  Chest x-ray otherwise noted to be clear and BNP was within normal  limits. Last EF noted to be 60 to 65% with grade 3 diastolic dysfunction back in 07/2019. -Continue to monitor  Abnormal TSH: Previously TSH has been elevated at 4.553 on 07/22/2019. -Check TSH and free T4  GERD -Continue protonix  DVT prophylaxis: Eliquis Code Status: Full Family Communication: Daughter updated over the phone Disposition Plan:  Consults called:None Admission status: observation  Norval Morton MD [facility]   If 7PM-7AM, please contact night-coverage   12/08/2020, 2:04 PM

## 2020-12-08 NOTE — Evaluation (Addendum)
Physical Therapy Evaluation Patient Details Name: Danielle Colon MRN: [id number] DOB: 10-17-1930 Today's Date: 12/08/2020  History of Present Illness  87 y.o. female presented 12/08/20 with dizziness. Reports several days of spinning dizziness. Head CT negative MRI pending PMH- Bell's palsy, pancreatitis, pacemaker, afib, urinary incontinence  Clinical Impression   Pt admitted secondary to problem above with deficits below. PTA patient was independent with mobility and lives with her daughter who works from home 4 days/week.  Pt currently requires min assist for transfer and was unable to ambulate due to orthostasis (symptomatic). See vitals flowsheet. Vestibular evaluation completed with no sign of vestibular involvement (was not able to recreate spinning sensation, however pt was lightheaded with drop in BP).  Anticipate patient will benefit from PT to address problems listed below.Will continue to follow acutely to maximize functional mobility independence and safety.          Recommendations for follow up therapy are one component of a multi-disciplinary discharge planning process, led by the attending physician.  Recommendations may be updated based on patient status, additional functional criteria and insurance authorization.  Follow Up Recommendations Home health PT    Assistance Recommended at Discharge Intermittent Supervision/Assistance  Functional Status Assessment Patient has had a recent decline in their functional status and demonstrates the ability to make significant improvements in function in a reasonable and predictable amount of time.  Equipment Recommendations  None recommended by PT    Recommendations for Other Services OT consult     Precautions / Restrictions Precautions Precautions: Fall   Vestibular Assessment  12/08/20 0001  Symptom Behavior  Subjective history of current problem several days of worsening  spinning with movement  Type of Dizziness   Spinning  Frequency of Dizziness several times per day  Duration of Dizziness seconds  Symptom Nature Motion provoked  Aggravating Factors Forward bending;Lying supine  Relieving Factors Head stationary  Progression of Symptoms Worse  History of similar episodes no  Oculomotor Exam  Oculomotor Alignment Normal  Ocular ROM normal  Spontaneous Absent  Gaze-induced  Absent  Smooth Pursuits Intact  Vestibulo-Ocular Reflex  VOR to Slow Head Movement Normal  Comment HIT negative bil  Positional Testing  Dix-Hallpike Dix-Hallpike Right;Dix-Hallpike Left  Horizontal Canal Testing Horizontal Canal Right;Horizontal Canal Left  Dix-Hallpike Right  Dix-Hallpike Right Duration 0  Dix-Hallpike Right Symptoms No nystagmus  Dix-Hallpike Left  Dix-Hallpike Left Duration 0  Dix-Hallpike Left Symptoms No nystagmus  Horizontal Canal Right  Horizontal Canal Right Duration 0  Horizontal Canal Right Symptoms Normal  Horizontal Canal Left  Horizontal Canal Left Duration 0  Horizontal Canal Left Symptoms Normal  Orthostatics  Orthostatics Comment see flowsheet (+)     Mobility  Bed Mobility Overal bed mobility: Modified Independent             General bed mobility comments: supine to sit and sit to supine with HOB elevated    Transfers Overall transfer level: Needs assistance Equipment used: 1 person hand held assist Transfers: Sit to/from Stand Sit to Stand: Min guard           General transfer comment: due to recent dizziness, HHA for safety    Ambulation/Gait               General Gait Details: deferred due to urgent need to use BSC and drop in SBP with lightheadedness  Stairs            Wheelchair Mobility    Modified Rankin (Stroke Patients Only)  Balance Overall balance assessment: Needs assistance Sitting-balance support: No upper extremity supported;Feet unsupported Sitting balance-Leahy Scale: Good     Standing balance support: Single  extremity supported;During functional activity Standing balance-Leahy Scale: Poor Standing balance comment: feeling dizzy and offbalance with UE support for safety                             Pertinent Vitals/Pain Pain Assessment: No/denies pain    Home Living Family/patient expects to be discharged to:: Private residence Living Arrangements: Children Available Help at Discharge: Family;Available PRN/intermittently (daughter goes to office on Thursdays) Type of Home: House Home Access: Level entry       Home Layout: One level Home Equipment: Agricultural consultant (2 wheels)      Prior Function Prior Level of Function : Independent/Modified Independent             Mobility Comments: walks without a device ADLs Comments: takes a "bird bath"; still drives even though MD told her not to     Hand Dominance        Extremity/Trunk Assessment   Upper Extremity Assessment Upper Extremity Assessment: Overall WFL for tasks assessed    Lower Extremity Assessment Lower Extremity Assessment: Overall WFL for tasks assessed    Cervical / Trunk Assessment Cervical / Trunk Assessment: Normal  Communication   Communication: No difficulties  Cognition Arousal/Alertness: Awake/alert Behavior During Therapy: WFL for tasks assessed/performed Overall Cognitive Status: Within Functional Limits for tasks assessed                                          General Comments General comments (skin integrity, edema, etc.): see +orthostatic BPs in flowsheet    Exercises     Assessment/Plan    PT Assessment Patient needs continued PT services  PT Problem List Decreased activity tolerance;Decreased balance;Decreased mobility;Decreased knowledge of use of DME;Cardiopulmonary status limiting activity;Obesity       PT Treatment Interventions DME instruction;Gait training;Functional mobility training;Therapeutic activities;Therapeutic exercise;Balance  training;Patient/family education    PT Goals (Current goals can be found in the Care Plan section)  Acute Rehab PT Goals Patient Stated Goal: stop having dizziness PT Goal Formulation: With patient Time For Goal Achievement: 12/22/20 Potential to Achieve Goals: Good    Frequency Min 3X/week   Barriers to discharge        Co-evaluation               AM-PAC PT "6 Clicks" Mobility  Outcome Measure Help needed turning from your back to your side while in a flat bed without using bedrails?: None Help needed moving from lying on your back to sitting on the side of a flat bed without using bedrails?: None Help needed moving to and from a bed to a chair (including a wheelchair)?: A Little Help needed standing up from a chair using your arms (e.g., wheelchair or bedside chair)?: A Little Help needed to walk in hospital room?: Total Help needed climbing 3-5 steps with a railing? : Total 6 Click Score: 16    End of Session   Activity Tolerance: Treatment limited secondary to medical complications (Comment) (orthostasis, lightheaded) Patient left: in bed;with call bell/phone within reach;with bed alarm set Nurse Communication: Mobility status;Other (comment) (orthostasis) PT Visit Diagnosis: Unsteadiness on feet (R26.81);Difficulty in walking, not elsewhere classified (R26.2)    Time: 7106-2694 PT  Time Calculation (min) (ACUTE ONLY): 38 min   Charges:   PT Evaluation $PT Eval Moderate Complexity: 1 Mod PT Treatments $Therapeutic Activity: 8-22 mins         Arby Barrette, PT Acute Rehabilitation Services  Pager [phone] Office [phone]   Rexanne Mano 12/08/2020, 4:41 PM

## 2020-12-08 NOTE — ED Notes (Signed)
ED Provider at bedside. 

## 2020-12-08 NOTE — Plan of Care (Signed)

## 2020-12-09 ENCOUNTER — Observation Stay (HOSPITAL_COMMUNITY): Payer: Medicare Other

## 2020-12-09 DIAGNOSIS — R42 Dizziness and giddiness: Secondary | ICD-10-CM | POA: Diagnosis not present

## 2020-12-09 LAB — CBC
HCT: 34.2 % — ABNORMAL LOW (ref 36.0–46.0)
Hemoglobin: 11.2 g/dL — ABNORMAL LOW (ref 12.0–15.0)
MCH: 29.9 pg (ref 26.0–34.0)
MCHC: 32.7 g/dL (ref 30.0–36.0)
MCV: 91.4 fL (ref 80.0–100.0)
Platelets: 162 10*3/uL (ref 150–400)
RBC: 3.74 MIL/uL — ABNORMAL LOW (ref 3.87–5.11)
RDW: 13 % (ref 11.5–15.5)
WBC: 6.3 10*3/uL (ref 4.0–10.5)
nRBC: 0 % (ref 0.0–0.2)

## 2020-12-09 LAB — BASIC METABOLIC PANEL
Anion gap: 7 (ref 5–15)
BUN: 17 mg/dL (ref 8–23)
CO2: 25 mmol/L (ref 22–32)
Calcium: 9.1 mg/dL (ref 8.9–10.3)
Chloride: 106 mmol/L (ref 98–111)
Creatinine, Ser: 1.09 mg/dL — ABNORMAL HIGH (ref 0.44–1.00)
GFR, Estimated: 49 mL/min — ABNORMAL LOW (ref >60)
Glucose, Bld: 92 mg/dL (ref 70–99)
Potassium: 3.9 mmol/L (ref 3.5–5.1)
Sodium: 138 mmol/L (ref 135–145)

## 2020-12-09 LAB — T4, FREE: Free T4: 0.97 ng/dL (ref 0.61–1.12)

## 2020-12-09 LAB — TSH: TSH: 6.693 u[IU]/mL — ABNORMAL HIGH (ref 0.350–4.500)

## 2020-12-09 MED ORDER — MECLIZINE HCL 25 MG PO TABS
25.0000 mg | ORAL_TABLET | Freq: Three times a day (TID) | ORAL | Status: DC
  Administered 2020-12-09: 25 mg via ORAL
  Filled 2020-12-09 (×3): qty 1

## 2020-12-09 MED ORDER — MECLIZINE HCL 25 MG PO TABS: 25.0000 mg | ORAL_TABLET | Freq: Three times a day (TID) | ORAL | 0 refills | Status: DC | PRN

## 2020-12-09 NOTE — Evaluation (Signed)
Occupational Therapy Evaluation Patient Details Name: Danielle Colon MRN: [id number] DOB: 03/21/1930 Today's Date: 12/09/2020   History of Present Illness 87 y.o. female presented 12/08/20 with dizziness. Reports several days of spinning dizziness. Head CT negative MRI pending PMH- Bell's palsy, pancreatitis, pacemaker, afib, urinary incontinence   Clinical Impression   Patient evaluated by Occupational Therapy with no further acute OT needs identified. All education has been completed and the patient has no further questions. Pt is able to complete ADLs with supervision/set up assist - min guard assist.  She reports she lives with her daughter, who can assist as needed.   See below for any follow-up Occupational Therapy or equipment needs. OT is signing off. Thank you for this referral.      Recommendations for follow up therapy are one component of a multi-disciplinary discharge planning process, led by the attending physician.  Recommendations may be updated based on patient status, additional functional criteria and insurance authorization.   Follow Up Recommendations  No OT follow up    Assistance Recommended at Discharge Intermittent Supervision/Assistance  Functional Status Assessment  Patient has had a recent decline in their functional status and demonstrates the ability to make significant improvements in function in a reasonable and predictable amount of time.  Equipment Recommendations  None recommended by OT    Recommendations for Other Services       Precautions / Restrictions Precautions Precautions: Fall Restrictions Weight Bearing Restrictions: No      Mobility Bed Mobility Overal bed mobility: Modified Independent             General bed mobility comments: pt up in chair    Transfers Overall transfer level: Needs assistance Equipment used: None Transfers: Sit to/from Stand;Bed to chair/wheelchair/BSC Sit to Stand: Supervision            General transfer comment: supervision for safety      Balance Overall balance assessment: Needs assistance Sitting-balance support: No upper extremity supported;Feet supported Sitting balance-Leahy Scale: Good     Standing balance support: No upper extremity supported Standing balance-Leahy Scale: Fair                             ADL either performed or assessed with clinical judgement   ADL Overall ADL's : Needs assistance/impaired Eating/Feeding: Independent   Grooming: Wash/dry hands;Wash/dry face;Oral care;Brushing hair;Supervision/safety;Standing   Upper Body Bathing: Set up;Sitting   Lower Body Bathing: Sit to/from stand;Supervison/ safety   Upper Body Dressing : Set up;Sitting   Lower Body Dressing: Sit to/from stand;Supervision/safety   Toilet Transfer: Min guard;Ambulation;Comfort height toilet   Toileting- Clothing Manipulation and Hygiene: Supervision/safety;Sit to/from stand       Functional mobility during ADLs: Min guard General ADL Comments: Pt c/o mild dizziness     Vision Baseline Vision/History: 0 No visual deficits Ability to See in Adequate Light: 0 Adequate Patient Visual Report: No change from baseline Vision Assessment?: Yes Eye Alignment: Within Functional Limits Ocular Range of Motion: Within Functional Limits Alignment/Gaze Preference: Within Defined Limits Tracking/Visual Pursuits: Able to track stimulus in all quads without difficulty Visual Fields: No apparent deficits Additional Comments: Pt with ptosis Rt eye which she reports is due to h/o Bell's palsy     Perception     Praxis      Pertinent Vitals/Pain Pain Assessment: Faces Faces Pain Scale: Hurts little more Pain Location: L trap, posterior neck Pain Descriptors / Indicators: Sore Pain Intervention(s): Monitored during  session     Hand Dominance     Extremity/Trunk Assessment Upper Extremity Assessment Upper Extremity Assessment: Overall WFL for tasks  assessed   Lower Extremity Assessment Lower Extremity Assessment: Overall WFL for tasks assessed   Cervical / Trunk Assessment Cervical / Trunk Assessment: Normal   Communication Communication Communication: No difficulties   Cognition Arousal/Alertness: Awake/alert Behavior During Therapy: WFL for tasks assessed/performed Overall Cognitive Status: Within Functional Limits for tasks assessed                                 General Comments: Pt scored 0/28 on the Short Blessed Test which was no errors     General Comments  VSS throughout session    Exercises Exercises: Other exercises Other Exercises Other Exercises: L trap stretch, 2 one minute holds Other Exercises: self soft tissue massage L upper trap   Shoulder Instructions      Home Living Family/patient expects to be discharged to:: Private residence Living Arrangements: Children Available Help at Discharge: Family;Available PRN/intermittently (Pt reports her daughter works from home and is available except on Thursdays she has to go into the office) Type of Home: House Home Access: Level entry     Home Layout: One level     Bathroom Shower/Tub: Chief Strategy Officer: Standard     Home Equipment: Agricultural consultant (2 wheels)          Prior Functioning/Environment Prior Level of Function : Independent/Modified Independent;Driving             Mobility Comments: walks without a device ADLs Comments: takes a "bird bath"; still drives even though MD told her not to        OT Problem List: Decreased activity tolerance      OT Treatment/Interventions:      OT Goals(Current goals can be found in the care plan section) Acute Rehab OT Goals Patient Stated Goal: to go home today OT Goal Formulation: All assessment and education complete, DC therapy  OT Frequency:     Barriers to D/C:            Co-evaluation              AM-PAC OT "6 Clicks" Daily Activity      Outcome Measure Help from another person eating meals?: None Help from another person taking care of personal grooming?: A Little Help from another person toileting, which includes using toliet, bedpan, or urinal?: A Little Help from another person bathing (including washing, rinsing, drying)?: A Little Help from another person to put on and taking off regular upper body clothing?: A Little Help from another person to put on and taking off regular lower body clothing?: A Little 6 Click Score: 19   End of Session Nurse Communication: Mobility status  Activity Tolerance: Patient tolerated treatment well Patient left: in chair;with call bell/phone within reach;with chair alarm set  OT Visit Diagnosis: Dizziness and giddiness (R42)                Time: 1660-6301 OT Time Calculation (min): 16 min Charges:  OT General Charges $OT Visit: 1 Visit OT Evaluation $OT Eval Moderate Complexity: 1 Mod  Yasamin Karel C., OTR/L Acute Rehabilitation Services Pager [phone] Office [phone]   Jeani Hawking M 12/09/2020, 11:59 AM
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
## 2020-12-09 NOTE — Care Management Obs Status (Signed)
MEDICARE OBSERVATION STATUS NOTIFICATION   Patient Details  Name: Danielle Colon MRN: [id number] Date of Birth: 22-Dec-1930   Medicare Observation Status Notification Given:  Yes    Harriet Masson, RN 12/09/2020, 9:32 AM

## 2020-12-09 NOTE — TOC Initial Note (Signed)
Transition of Care [facility]) - Initial/Assessment Note    Patient Details  Name: Danielle Colon MRN: [id number] Date of Birth: 1930/10/20  Transition of Care [facility]) CM/SW Contact:    Harriet Masson, RN Phone Number: 12/09/2020, 12:54 PM  Clinical Narrative:         Order fro HH-PT/OT. Spoke to patient and daughter regarding transition needs. Patient has a walker at home. Patient lives with daughter. Patient deferred to [facility] to find [facility] agency. Spoke to [employer] at [facility] and referral accepted for HH-PT/OT Address, Phone number and PCP verified.            Expected Discharge Plan: Home w Home Health Services Barriers to Discharge: Continued Medical Work up   Patient Goals and CMS Choice Patient states their goals for this hospitalization and ongoing recovery are:: return home CMS Medicare.gov Compare Post Acute Care list provided to:: Patient Choice offered to / list presented to : Patient, Adult Children  Expected Discharge Plan and Services Expected Discharge Plan: Home w Home Health Services   Discharge Planning Services: CM Consult Post Acute Care Choice: Home Health Living arrangements for the past 2 months: Single Family Home                           HH Arranged: PT, OT HH Agency: [facility] Date [facility] Agency Contacted: 12/09/20 Time HH Agency Contacted: [id number] Representative spoke with at [facility] Agency: Sue Lush  Prior Living Arrangements/Services Living arrangements for the past 2 months: Single Family Home Lives with:: Adult Children Patient language and need for interpreter reviewed:: Yes Do you feel safe going back to the place where you live?: Yes      Need for Family Participation in Patient Care: Yes (Comment) Care giver support system in place?: Yes (comment) Current home services: DME (walker) Criminal Activity/Legal Involvement Pertinent to Current Situation/Hospitalization: No - Comment as needed  Activities of Daily Living Home Assistive  Devices/Equipment: Walker (specify type) ADL Screening (condition at time of admission) Patient's cognitive ability adequate to safely complete daily activities?: Yes Is the patient deaf or have difficulty hearing?: No Does the patient have difficulty seeing, even when wearing glasses/contacts?: No Does the patient have difficulty concentrating, remembering, or making decisions?: No Patient able to express need for assistance with ADLs?: Yes Does the patient have difficulty dressing or bathing?: Yes Independently performs ADLs?: No Communication: Independent Dressing (OT): Needs assistance Is this a change from baseline?: Pre-admission baseline Grooming: Independent Is this a change from baseline?: Pre-admission baseline Feeding: Independent Bathing: Needs assistance Is this a change from baseline?: Pre-admission baseline Toileting: Needs assistance Is this a change from baseline?: Pre-admission baseline In/Out Bed: Needs assistance Is this a change from baseline?: Pre-admission baseline Walks in Home: Independent Does the patient have difficulty walking or climbing stairs?: No Weakness of Legs: None Weakness of Arms/Hands: None  Permission Sought/Granted Permission sought to share information with : Facility Industrial/product designer granted to share information with : Yes, Verbal Permission Granted     Permission granted to share info w AGENCY: home health        Emotional Assessment Appearance:: Appears older than stated age Attitude/Demeanor/Rapport: Engaged Affect (typically observed): Accepting Orientation: : Oriented to Self, Oriented to Place, Oriented to  Time, Oriented to Situation Alcohol / Substance Use: Not Applicable Psych Involvement: No (comment)  Admission diagnosis:  Vertigo [R42] Patient Active Problem List   Diagnosis Date Noted   Vertigo 12/08/2020  Hypercalcemia 12/08/2020   SSS (sick sinus syndrome) ([place]) 12/08/2020   Stage 3a chronic  kidney disease (CKD) ([place]) 12/08/2020   Abnormal TSH 12/08/2020   Acute pancreatitis 05/22/2020   Sinus bradycardia 03/31/2020   Pacemaker 01/05/2020   Atrial flutter with rapid ventricular response ([place]) 01/05/2020   Syncope 07/23/2019   Atrial fibrillation ([place]) 07/22/2019   Atrial fibrillation with RVR ([place]) 07/22/2019   Syncope and collapse 07/22/2019   Hypokalemia 03/01/2019   Anemia 03/01/2019   Pancreatitis 02/28/2019   Hyperlipidemia 02/28/2019   Obesity (BMI 30.0-34.9) 02/28/2019   Hypoxia 02/28/2019   GERD (gastroesophageal reflux disease) 07/19/2013   Urinary incontinence 07/19/2013   Lung nodule seen on imaging study 07/19/2013   Benign hypertension 07/18/2013   History of Bell's palsy 06/27/2013   PCP:  Lawerance Cruel, MD Pharmacy:   [id number] [place], [address] [place] [address] Phone: [phone] Fax: [phone]     Social Determinants of Health (SDOH) Interventions    Readmission Risk Interventions Readmission Risk Prevention Plan 03/05/2019  Transportation Screening Complete  PCP or Specialist Appt within 5-7 Days Not Complete  Home Care Screening Complete  Medication Review (RN CM) Referral to Pharmacy  Some recent data might be hidden

## 2020-12-09 NOTE — Progress Notes (Signed)
Per order, Changed device settings for MRI to  ?DOO at 80 bpm  ?Will program device back to pre-MRI settings after completion of exam, and send transmission ?

## 2020-12-09 NOTE — Plan of Care (Signed)

## 2020-12-09 NOTE — Discharge Instructions (Signed)
Follow with Primary MD Daisy Floro, MD in 7 days   Get CBC, CMP, 2 view Chest X ray -  checked next visit within 1 week by Primary MD workup next visit with your PCP, your PCP may decide not to get them or add new tests based on their clinical decision)  Activity: As tolerated with Full fall precautions use walker/cane & assistance as needed  Disposition Home     Diet: Heart Healthy   Special Instructions: If you have smoked or chewed Tobacco  in the last 2 yrs please stop smoking, stop any regular Alcohol  and or any Recreational drug use.  On your next visit with your primary care physician please Get Medicines reviewed and adjusted.  Please request your Prim.MD to go over all Hospital Tests and Procedure/Radiological results at the follow up, please get all Hospital records sent to your Prim MD by signing hospital release before you go home.  If you experience worsening of your admission symptoms, develop shortness of breath, life threatening emergency, suicidal or homicidal thoughts you must seek medical attention immediately by calling 911 or calling your MD immediately  if symptoms less severe.  You Must read complete instructions/literature along with all the possible adverse reactions/side effects for all the Medicines you take and that have been prescribed to you. Take any new Medicines after you have completely understood and accpet all the possible adverse reactions/side effects.

## 2020-12-09 NOTE — Discharge Summary (Signed)
Danielle Colon [facility] DPO:[id number] DOB: [birth year] DOA: 12/08/2020  PCP: Daisy Floro, MD  Admit date: 12/08/2020  Discharge date: 12/09/2020  Admitted From: Home   Disposition:  Home   Recommendations for Outpatient Follow-up:   Follow up with PCP in 1-2 weeks  PCP Please obtain BMP/CBC, 2 view CXR in 1week,  (see Discharge instructions)   PCP Please follow up on the following pending results: monitor for Vertigo, follow CMP, Calcium, TSH closely   Home Health: PT,OT   Equipment/Devices: Cane  Consultations: None  Discharge Condition: Stable    CODE STATUS: Full    Diet Recommendation: Heart Healthy   Diet Order             Diet - low sodium heart healthy           Diet Heart Room service appropriate? Yes; Fluid consistency: Thin  Diet effective now                    Chief Complaint  Patient presents with   Dizziness    With feet swelling     Brief history of present illness from the day of admission and additional interim summary    : Danielle Colon is a 87 y.o. female with medical history significant of hypertension, hyperlipidemia, paroxysmal atrial fibrillation on Eliquis, SSS s/p PPM, and GERD presents with a 4-day history of dizziness, likely BPV.                                                                 Hospital Course    Vertigo: Acute.  likely BPV, treated with Meclizine and PT-OT, much better, MRI non acute, DC on Antivert, HHPT - OT and Cane, PCP to monitor.  Mild Hypercalcemia: Acute.  Calcium mildly elevated 10.7 on admission.  Patient is on vitamin D supplementation. -Held Vit D here, PCP to follow.   Paroxysmal atrial fibrillation on chronic anticoagulation: Patient appears to be in sinus rhythm at this time -Continue Cardizem and Eliquis   Sick sinus syndrome  s/p pacemaker: Patient had a Medtronic pacemaker placed in 2021.  She is followed by Dr. Graciela Husbands of cardiology in outpatient setting.   Chronic kidney disease stage IIIa: Creatinine appears at baseline, no AKI.   Heart failure with preserved EF 60% : Stable. Patient with mild lower extremity edema, but no significant JVD or crackles heard on lung exam.  Patient appears to be euvolemic.     Abnormal TSH:  likely sick Euthyroid, PCP to repeat in 2-3 weeks   GERD -Continue protonix     Discharge diagnosis     Principal Problem:   Vertigo Active Problems:   Sinus bradycardia   Hypercalcemia   SSS (sick sinus syndrome) ([facility])   Stage 3a chronic kidney disease (CKD) ([facility])  Abnormal TSH    Discharge instructions    Discharge Instructions     Diet - low sodium heart healthy   Complete by: As directed    Discharge instructions   Complete by: As directed    Follow with Primary MD Lawerance Cruel, MD in 7 days   Get CBC, CMP, 2 view Chest X ray -  checked next visit within 1 week by Primary MD workup next visit with your PCP, your PCP may decide not to get them or add new tests based on their clinical decision)  Activity: As tolerated with Full fall precautions use walker/cane & assistance as needed  Disposition Home     Diet: Heart Healthy   Special Instructions: If you have smoked or chewed Tobacco  in the last 2 yrs please stop smoking, stop any regular Alcohol  and or any Recreational drug use.  On your next visit with your primary care physician please Get Medicines reviewed and adjusted.  Please request your Prim.MD to go over all Hospital Tests and Procedure/Radiological results at the follow up, please get all Hospital records sent to your Prim MD by signing hospital release before you go home.  If you experience worsening of your admission symptoms, develop shortness of breath, life threatening emergency, suicidal or homicidal thoughts you must seek medical attention  immediately by calling 911 or calling your MD immediately  if symptoms less severe.  You Must read complete instructions/literature along with all the possible adverse reactions/side effects for all the Medicines you take and that have been prescribed to you. Take any new Medicines after you have completely understood and accpet all the possible adverse reactions/side effects.   Increase activity slowly   Complete by: As directed        Discharge Medications   Allergies as of 12/09/2020       Reactions   Tessalon Perles [benzonatate] Other (See Comments)   unknown   Neurontin [gabapentin] Other (See Comments)   unknown   Zantac [ranitidine Hcl] Other (See Comments)   GI upset        Medication List     TAKE these medications    acetaminophen 325 MG tablet Commonly known as: TYLENOL Take 2 tablets (650 mg total) by mouth every 6 (six) hours as needed for mild pain (or Fever >/= 101).   ascorbic acid 500 MG tablet Commonly known as: VITAMIN C Take 500 mg by mouth daily.   cholecalciferol 25 MCG (1000 UNIT) tablet Commonly known as: VITAMIN D3 Take 1,000 Units by mouth at bedtime.   Creon 24000-76000 units Cpep Generic drug: Pancrelipase (Lip-Prot-Amyl) Take 1 capsule by mouth 3 (three) times daily.   diltiazem 180 MG 24 hr capsule Commonly known as: CARDIZEM CD Take 1 capsule (180 mg total) by mouth daily.   Eliquis 2.5 MG Tabs tablet Generic drug: apixaban Take 2.5 mg by mouth 2 (two) times daily.   meclizine 25 MG tablet Commonly known as: ANTIVERT Take 1 tablet (25 mg total) by mouth 3 (three) times daily as needed.   pantoprazole 40 MG tablet Commonly known as: PROTONIX Take 40 mg by mouth daily.               Durable Medical Equipment  (From admission, onward)           Start     Ordered   12/09/20 1036  For home use only DME Cane  Once        12/09/20 1035  Follow-up Information     Lawerance Cruel, MD. Schedule  an appointment as soon as possible for a visit in 1 week(s).   Specialty: Family Medicine Contact information: [place] [address] [phone]         Kate Sable, MD .   Specialties: Cardiology, Radiology Contact information: [place] [address] [place], [place] Follow up.   Specialty: Home Health Services Why: HH-PT/OT arranged. They will contact you to schedle apt Contact information: [place] [address] [phone]                 Major procedures and Radiology Reports - PLEASE review detailed and final reports thoroughly  -        CT Head Wo Contrast  Result Date: 12/08/2020 CLINICAL DATA:  Dizziness. EXAM: CT HEAD WITHOUT CONTRAST TECHNIQUE: Contiguous axial images were obtained from the base of the skull through the vertex without intravenous contrast. COMPARISON:  July 22, 2019. FINDINGS: Brain: No evidence of acute infarction, hemorrhage, hydrocephalus, extra-axial collection or mass lesion/mass effect. Vascular: No hyperdense vessel or unexpected calcification. Skull: Normal. Negative for fracture or focal lesion. Sinuses/Orbits: No acute finding. Other: None. IMPRESSION: No acute intracranial abnormality seen. Electronically Signed   By: Marijo Conception M.D.   On: 12/08/2020 09:37   MR BRAIN WO CONTRAST  Result Date: 12/09/2020 CLINICAL DATA:  Dizziness, persistent/recurrent, cardiac or vascular cause suspected EXAM: MRI HEAD WITHOUT CONTRAST TECHNIQUE: Multiplanar, multiecho pulse sequences of the brain and surrounding structures were obtained without intravenous contrast. COMPARISON:  None. FINDINGS: Brain: No acute infarction, hemorrhage, hydrocephalus, extra-axial collection or mass lesion. Mild scattered T2/FLAIR hyperintensities in the white matter, nonspecific but compatible with chronic microvascular ischemic disease Vascular:  Major arterial flow voids are maintained at the skull base. Skull and upper cervical spine: Normal marrow signal. Sinuses/Orbits: Mild ethmoid air cell mucosal thickening. Other: No mastoid effusions. IMPRESSION: 1. No evidence of acute intracranial abnormality.  No acute infarct. 2. Mild chronic microvascular ischemic disease. Electronically Signed   By: Margaretha Sheffield M.D.   On: 12/09/2020 14:59   DG Chest Portable 1 View  Result Date: 12/08/2020 CLINICAL DATA:  Edema, dizziness EXAM: PORTABLE CHEST 1 VIEW COMPARISON:  Chest radiograph 09/27/2018 FINDINGS: The left chest wall cardiac device and associated leads are stable. The cardiomediastinal silhouette is stable. There is no focal consolidation. There is no pulmonary edema. Linear opacity projecting over the left midlung may reflect atelectasis or scar. There is no pleural effusion or pneumothorax. There is no acute osseous abnormality. IMPRESSION: No radiographic evidence of acute cardiopulmonary process. Electronically Signed   By: Valetta Mole M.D.   On: 12/08/2020 09:38      Today   Subjective    Jeanie Sewer today has no headache,no chest abdominal pain,no new weakness tingling or numbness, feels much better wants to go home today.   Objective   Blood pressure 118/66, pulse 60, temperature 98.3 F (36.8 C), temperature source Oral, resp. rate 14, height 5\' 1"  (1.549 m), weight 69.9 kg, SpO2 96 %.   Intake/Output Summary (Last 24 hours) at 12/09/2020 1549 Last data filed at 12/09/2020 1118 Gross per 24 hour  Intake 350 ml  Output 1150 ml  Net -800 ml    Exam  Awake Alert, No new F.N deficits, Normal affect [place].AT,PERRAL Supple Neck,No JVD, No cervical lymphadenopathy appriciated.  Symmetrical Chest wall movement, Good air movement bilaterally, CTAB RRR,No Gallops,Rubs or new Murmurs, No Parasternal Heave +ve B.Sounds, Abd Soft, Non tender, No organomegaly appriciated, No rebound -guarding or rigidity. No Cyanosis,  Clubbing or edema, No new Rash or bruise   Data Review   CBC w Diff:  Lab Results  Component Value Date   WBC 6.3 12/09/2020   HGB 11.2 (L) 12/09/2020   HGB 12.3 09/03/2019   HCT 34.2 (L) 12/09/2020   HCT 35.3 09/03/2019   PLT 162 12/09/2020   PLT 179 09/03/2019   LYMPHOPCT 17 12/08/2020   MONOPCT 8 12/08/2020   EOSPCT 2 12/08/2020   BASOPCT 1 12/08/2020    CMP:  Lab Results  Component Value Date   NA 138 12/09/2020   NA 143 09/03/2019   K 3.9 12/09/2020   CL 106 12/09/2020   CO2 25 12/09/2020   BUN 17 12/09/2020   BUN 16 09/03/2019   CREATININE 1.09 (H) 12/09/2020   PROT 7.6 12/08/2020   ALBUMIN 4.3 12/08/2020   BILITOT 0.7 12/08/2020   ALKPHOS 108 12/08/2020   AST 18 12/08/2020   ALT 22 12/08/2020  .   Total Time in preparing paper work, data evaluation and todays exam - 97 minutes  Lala Lund M.D on 12/09/2020 at 3:49 PM  [facility]

## 2020-12-09 NOTE — Care Management Important Message (Deleted)
Important Message  Patient Details  Name: Danielle Colon MRN: [id number] Date of Birth: 1930-09-20   Medicare Important Message Given:        Harriet Masson, RN 12/09/2020, 9:32 AM

## 2020-12-15 DIAGNOSIS — R42 Dizziness and giddiness: Secondary | ICD-10-CM | POA: Diagnosis not present

## 2020-12-25 ENCOUNTER — Ambulatory Visit (INDEPENDENT_AMBULATORY_CARE_PROVIDER_SITE_OTHER): Payer: Medicare Other

## 2020-12-25 DIAGNOSIS — I495 Sick sinus syndrome: Secondary | ICD-10-CM | POA: Diagnosis not present

## 2020-12-25 LAB — CUP PACEART REMOTE DEVICE CHECK
Battery Remaining Longevity: 159 mo
Battery Voltage: 3.06 V
Brady Statistic AP VP Percent: 0.04 %
Brady Statistic AP VS Percent: 49.14 %
Brady Statistic AS VP Percent: 0.02 %
Brady Statistic AS VS Percent: 50.83 %
Brady Statistic RA Percent Paced: 42.24 %
Brady Statistic RV Percent Paced: 1.42 %
Date Time Interrogation Session: 20221208181828
Implantable Lead Implant Date: 20210910
Implantable Lead Implant Date: 20210910
Implantable Lead Location: 753859
Implantable Lead Location: 753860
Implantable Lead Model: 5076
Implantable Lead Model: 5076
Implantable Pulse Generator Implant Date: 20210910
Lead Channel Impedance Value: 323 Ohm
Lead Channel Impedance Value: 399 Ohm
Lead Channel Impedance Value: 437 Ohm
Lead Channel Impedance Value: 551 Ohm
Lead Channel Pacing Threshold Amplitude: 0.625 V
Lead Channel Pacing Threshold Amplitude: 0.625 V
Lead Channel Pacing Threshold Pulse Width: 0.4 ms
Lead Channel Pacing Threshold Pulse Width: 0.4 ms
Lead Channel Sensing Intrinsic Amplitude: 1.125 mV
Lead Channel Sensing Intrinsic Amplitude: 1.125 mV
Lead Channel Sensing Intrinsic Amplitude: 5.75 mV
Lead Channel Sensing Intrinsic Amplitude: 5.75 mV
Lead Channel Setting Pacing Amplitude: 1.5 V
Lead Channel Setting Pacing Amplitude: 2.5 V
Lead Channel Setting Pacing Pulse Width: 0.4 ms
Lead Channel Setting Sensing Sensitivity: 1.2 mV

## 2021-01-03 DIAGNOSIS — Z20822 Contact with and (suspected) exposure to covid-19: Secondary | ICD-10-CM | POA: Diagnosis not present

## 2021-01-03 DIAGNOSIS — U071 COVID-19: Secondary | ICD-10-CM | POA: Diagnosis not present

## 2021-01-04 ENCOUNTER — Emergency Department (HOSPITAL_BASED_OUTPATIENT_CLINIC_OR_DEPARTMENT_OTHER): Payer: Medicare Other

## 2021-01-04 ENCOUNTER — Encounter (HOSPITAL_BASED_OUTPATIENT_CLINIC_OR_DEPARTMENT_OTHER): Payer: Self-pay

## 2021-01-04 ENCOUNTER — Other Ambulatory Visit: Payer: Self-pay

## 2021-01-04 ENCOUNTER — Emergency Department (HOSPITAL_BASED_OUTPATIENT_CLINIC_OR_DEPARTMENT_OTHER)
Admission: EM | Admit: 2021-01-04 | Discharge: 2021-01-04 | Disposition: A | Discharge status: Home / Self Care | Payer: Medicare Other | Attending: Emergency Medicine | Admitting: Emergency Medicine

## 2021-01-04 DIAGNOSIS — U071 COVID-19: Secondary | ICD-10-CM | POA: Diagnosis not present

## 2021-01-04 DIAGNOSIS — I517 Cardiomegaly: Secondary | ICD-10-CM | POA: Diagnosis not present

## 2021-01-04 DIAGNOSIS — Z950 Presence of cardiac pacemaker: Secondary | ICD-10-CM | POA: Diagnosis not present

## 2021-01-04 DIAGNOSIS — R509 Fever, unspecified: Secondary | ICD-10-CM | POA: Diagnosis present

## 2021-01-04 DIAGNOSIS — I129 Hypertensive chronic kidney disease with stage 1 through stage 4 chronic kidney disease, or unspecified chronic kidney disease: Secondary | ICD-10-CM | POA: Insufficient documentation

## 2021-01-04 DIAGNOSIS — Z7901 Long term (current) use of anticoagulants: Secondary | ICD-10-CM | POA: Insufficient documentation

## 2021-01-04 DIAGNOSIS — N1831 Chronic kidney disease, stage 3a: Secondary | ICD-10-CM | POA: Diagnosis not present

## 2021-01-04 DIAGNOSIS — Z79899 Other long term (current) drug therapy: Secondary | ICD-10-CM | POA: Insufficient documentation

## 2021-01-04 MED ORDER — DEXAMETHASONE SODIUM PHOSPHATE 10 MG/ML IJ SOLN
10.0000 mg | Freq: Once | INTRAMUSCULAR | Status: AC
  Administered 2021-01-04: 05:00:00 10 mg via INTRAMUSCULAR
  Filled 2021-01-04: qty 1

## 2021-01-04 NOTE — ED Provider Notes (Signed)
[facility] EMERGENCY DEPT Provider Note   CSN: [id number] Arrival date & time: 01/04/21  2595     History Chief Complaint  Patient presents with   Cough   Fever    Danielle Colon is a 87 y.o. female.  HPI     This is an 87 year old female with a history of atrial fibrillation on Eliquis, reflux, hypertension, hyperlipidemia who presents with cough and fever.  Patient had onset of symptoms on Saturday.  She tested positive for COVID-19 on Sunday.  She has not vaccinated.  She called her primary physician who prescribed her Paxilovid.  The pharmacy was out but she is planning to pick up later today.  Overnight she developed a temperature up to 101.  She took Tylenol prior to arrival.  She has had intermittent cough overnight but has been able to sleep some.  She reports sore throat.  Denies any significant shortness of breath only cough.  No chest pain, nausea, vomiting, diarrhea.  Past Medical History:  Diagnosis Date   Atrial fibrillation (HCC)    Bell's palsy 2009;    on the right   Brain TIA    pt denies this hx on 07/18/2013; "they thought it was a stroke but dr said later that it was Bell's Palsy"   Cerebrovascular disease    GERD (gastroesophageal reflux disease)    Hepatitis    "don't remember what kind; had it ~ 3 times when I was younger; last time was in the 1990's"   Hyperlipidemia    Hypertension    Pancreatitis     Patient Active Problem List   Diagnosis Date Noted   Vertigo 12/08/2020   Hypercalcemia 12/08/2020   SSS (sick sinus syndrome) (HCC) 12/08/2020   Stage 3a chronic kidney disease (CKD) (HCC) 12/08/2020   Abnormal TSH 12/08/2020   Acute pancreatitis 05/22/2020   Sinus bradycardia 03/31/2020   Pacemaker 01/05/2020   Atrial flutter with rapid ventricular response (HCC) 01/05/2020   Syncope 07/23/2019   Atrial fibrillation (HCC) 07/22/2019   Atrial fibrillation with RVR (HCC) 07/22/2019   Syncope and collapse 07/22/2019    Hypokalemia 03/01/2019   Anemia 03/01/2019   Pancreatitis 02/28/2019   Hyperlipidemia 02/28/2019   Obesity (BMI 30.0-34.9) 02/28/2019   Hypoxia 02/28/2019   GERD (gastroesophageal reflux disease) 07/19/2013   Urinary incontinence 07/19/2013   Lung nodule seen on imaging study 07/19/2013   Benign hypertension 07/18/2013   History of Bell's palsy 06/27/2013    Past Surgical History:  Procedure Laterality Date   APPENDECTOMY  ~ 1971   CATARACT EXTRACTION Right    CHOLECYSTECTOMY  ~ 1971   PACEMAKER IMPLANT N/A 09/27/2019   Procedure: PACEMAKER IMPLANT;  Surgeon: Duke Salvia, MD;  Location: [facility] INVASIVE CV LAB;  Service: Cardiovascular;  Laterality: N/A;   TONSILLECTOMY  1949     OB History   No obstetric history on file.     Family History  Problem Relation Age of Onset   Pneumonia Mother        old age   Heart attack Father    Diabetes Father    Diabetes Sister    Diabetes Brother    Cancer Sister    Diabetes Sister    Diabetes Sister     Social History   Tobacco Use   Smoking status: Never   Smokeless tobacco: Never  Vaping Use   Vaping Use: Never used  Substance Use Topics   Alcohol use: No   Drug use: No  Home Medications Prior to Admission medications   Medication Sig Start Date End Date Taking? Authorizing Provider  acetaminophen (TYLENOL) 325 MG tablet Take 2 tablets (650 mg total) by mouth every 6 (six) hours as needed for mild pain (or Fever >/= 101). 03/05/19  Yes Hoyt Koch, MD  ascorbic acid (VITAMIN C) 500 MG tablet Take 500 mg by mouth daily.   Yes [provider]  cholecalciferol (VITAMIN D3) 25 MCG (1000 UNIT) tablet Take 1,000 Units by mouth at bedtime.    Yes [provider]  CREON 24000-76000 units CPEP Take 1 capsule by mouth 3 (three) times daily. 04/23/20  Yes [provider]  ELIQUIS 2.5 MG TABS tablet Take 2.5 mg by mouth 2 (two) times daily. 05/19/20  Yes [provider]  pantoprazole  (PROTONIX) 40 MG tablet Take 40 mg by mouth daily. 05/01/20  Yes [provider]  diltiazem (CARDIZEM CD) 180 MG 24 hr capsule Take 1 capsule (180 mg total) by mouth daily. 01/08/20 12/08/20  Deboraha Sprang, MD  meclizine (ANTIVERT) 25 MG tablet Take 1 tablet (25 mg total) by mouth 3 (three) times daily as needed. 12/09/20   Thurnell Lose, MD  losartan (COZAAR) 50 MG tablet Take 1 tablet (50 mg total) by mouth daily. 07/24/19 10/11/19  Aline August, MD    Allergies    Tessalon perles [benzonatate], Neurontin [gabapentin], and Zantac [ranitidine hcl]  Review of Systems   Review of Systems  Constitutional:  Positive for chills and fever.  HENT:  Positive for sore throat.   Respiratory:  Positive for cough. Negative for shortness of breath.   Cardiovascular:  Negative for chest pain.  Gastrointestinal:  Negative for abdominal pain, nausea and vomiting.  All other systems reviewed and are negative.  Physical Exam Updated Vital Signs BP (!) 164/78 (BP Location: Right Arm)    Pulse 88    Temp (!) 101.2 F (38.4 C) (Oral)    Resp 18    Ht 1.549 m (5\' 1" )    Wt 77.1 kg    SpO2 96%    BMI 32.12 kg/m   Physical Exam Vitals and nursing note reviewed.  Constitutional:      Appearance: She is well-developed.     Comments: Elderly, nontoxic-appearing  HENT:     Head: Normocephalic and atraumatic.     Nose: Nose normal.     Mouth/Throat:     Mouth: Mucous membranes are moist.     Comments: Posterior oropharynx erythematous, there is uvular and tonsillar swelling bilaterally, symmetric, no exudate Eyes:     Pupils: Pupils are equal, round, and reactive to light.  Cardiovascular:     Rate and Rhythm: Normal rate and regular rhythm.     Heart sounds: Normal heart sounds.  Pulmonary:     Effort: Pulmonary effort is normal. No respiratory distress.     Breath sounds: No wheezing.  Abdominal:     Palpations: Abdomen is soft.     Tenderness: There is no abdominal tenderness.   Musculoskeletal:     Cervical back: Neck supple.     Comments: Trace bilateral lower extremity edema  Skin:    General: Skin is warm and dry.  Neurological:     Mental Status: She is alert and oriented to person, place, and time.  Psychiatric:        Mood and Affect: Mood normal.    ED Results / Procedures / Treatments   Labs (all labs ordered are listed, but  only abnormal results are displayed) Labs Reviewed - No data to display  EKG None  Radiology DG Chest Portable 1 View  Result Date: 01/04/2021 CLINICAL DATA:  Cough, COVID positive. EXAM: PORTABLE CHEST 1 VIEW COMPARISON:  Portable chest 12/08/2020. FINDINGS: Left chest dual lead pacing system and wire insertions are stable. There is mild cardiomegaly, without evidence of vascular congestion, edema or pleural effusion. The aorta is slightly tortuous with calcification of the transverse segment. The lungs are clear of infiltrates with old linear scarring left mid field. Osteopenia. Stable chest with cardiomegaly. IMPRESSION: No evidence of acute chest disease or interval changes. Stable chest with mild cardiomegaly. Electronically Signed   By: Telford Nab M.D.   On: 01/04/2021 05:30    Procedures Procedures   Medications Ordered in ED Medications  dexamethasone (DECADRON) injection 10 mg (10 mg Intramuscular Given 01/04/21 0507)    ED Course  I have reviewed the triage vital signs and the nursing notes.  Pertinent labs & imaging results that were available during my care of the patient were reviewed by me and considered in my medical decision making (see chart for details).    MDM Rules/Calculators/A&P                          Patient presents with worsening symptoms of COVID-19.  She will start Paxilovid later today.  Vital signs notable for temperature one 101.2.  Otherwise pulse ox 96-97 percent.  She is in no respiratory distress.  She does have some tonsillar and uvular swelling although no airway compromise.   Doubt deep space infection.  Likely related to viral infection.  She was given a dose of IM Decadron for the this and inflammation.  Chest x-ray obtained and shows no evidence of pneumonia.  Symptoms likely related to known COVID-19 infection.  She is not hypoxic and in no acute distress.  Recommend supportive measures at home.  They will pick up prescription later this afternoon.  After history, exam, and medical workup I feel the patient has been appropriately medically screened and is safe for discharge home. Pertinent diagnoses were discussed with the patient. Patient was given return precautions.      Final Clinical Impression(s) / ED Diagnoses Final diagnoses:  [id number]    Rx / DC Orders ED Discharge Orders     None        Lashanna Angelo, Barbette Hair, MD 01/04/21 [phone]

## 2021-01-04 NOTE — Discharge Instructions (Signed)
Make sure to pick up your antiviral as planned later today.  Start taking it immediately.  Take Tylenol as needed for fevers.  You were given a dose of steroids given your throat symptoms.  This should start helping within the next 12 hours.  Otherwise make sure that you are staying hydrated.  Return for any new or worsening shortness of breath.  You may obtain a pulse oximeter to monitor your oxygen levels.  If they drop below 91%, you should return for evaluation.

## 2021-01-04 NOTE — Progress Notes (Signed)
Remote pacemaker transmission.   

## 2021-01-04 NOTE — ED Triage Notes (Signed)
Patient had a positive covid test yesterday at [facility]. Patient c/o cough and fever. Patient took 1g of tylenol and Delsym cough before arriving to ED.

## 2021-01-22 ENCOUNTER — Ambulatory Visit (HOSPITAL_COMMUNITY)
Admission: RE | Admit: 2021-01-22 | Discharge: 2021-01-22 | Disposition: A | Discharge status: Home / Self Care | Payer: Medicare Other | Source: Ambulatory Visit | Attending: Family Medicine | Admitting: Family Medicine

## 2021-01-22 ENCOUNTER — Other Ambulatory Visit: Payer: Self-pay

## 2021-01-22 ENCOUNTER — Other Ambulatory Visit (HOSPITAL_COMMUNITY): Payer: Self-pay | Admitting: Family Medicine

## 2021-01-22 DIAGNOSIS — M7989 Other specified soft tissue disorders: Secondary | ICD-10-CM | POA: Diagnosis not present

## 2021-01-22 DIAGNOSIS — I480 Paroxysmal atrial fibrillation: Secondary | ICD-10-CM | POA: Diagnosis not present

## 2021-01-22 DIAGNOSIS — N183 Chronic kidney disease, stage 3 unspecified: Secondary | ICD-10-CM | POA: Diagnosis not present

## 2021-01-22 DIAGNOSIS — I10 Essential (primary) hypertension: Secondary | ICD-10-CM | POA: Diagnosis not present

## 2021-02-05 DIAGNOSIS — K648 Other hemorrhoids: Secondary | ICD-10-CM | POA: Diagnosis not present

## 2021-02-05 DIAGNOSIS — K5900 Constipation, unspecified: Secondary | ICD-10-CM | POA: Diagnosis not present

## 2021-02-05 DIAGNOSIS — K625 Hemorrhage of anus and rectum: Secondary | ICD-10-CM | POA: Diagnosis not present

## 2021-03-04 DIAGNOSIS — Z130 Encounter for screening for diseases of the blood and blood-forming organs and certain disorders involving the immune mechanism: Secondary | ICD-10-CM | POA: Diagnosis not present

## 2021-03-04 DIAGNOSIS — K648 Other hemorrhoids: Secondary | ICD-10-CM | POA: Diagnosis not present

## 2021-03-04 DIAGNOSIS — K625 Hemorrhage of anus and rectum: Secondary | ICD-10-CM | POA: Diagnosis not present

## 2021-03-26 ENCOUNTER — Ambulatory Visit (INDEPENDENT_AMBULATORY_CARE_PROVIDER_SITE_OTHER): Payer: Medicare Other

## 2021-03-26 DIAGNOSIS — I495 Sick sinus syndrome: Secondary | ICD-10-CM | POA: Diagnosis not present

## 2021-03-26 LAB — CUP PACEART REMOTE DEVICE CHECK
Battery Remaining Longevity: 154 mo
Battery Voltage: 3.05 V
Brady Statistic AP VP Percent: 0.03 %
Brady Statistic AP VS Percent: 35.52 %
Brady Statistic AS VP Percent: 0.02 %
Brady Statistic AS VS Percent: 64.46 %
Brady Statistic RA Percent Paced: 30.75 %
Brady Statistic RV Percent Paced: 0.93 %
Date Time Interrogation Session: 20230310035632
Implantable Lead Implant Date: 20210910
Implantable Lead Implant Date: 20210910
Implantable Lead Location: 753859
Implantable Lead Location: 753860
Implantable Lead Model: 5076
Implantable Lead Model: 5076
Implantable Pulse Generator Implant Date: 20210910
Lead Channel Impedance Value: 323 Ohm
Lead Channel Impedance Value: 399 Ohm
Lead Channel Impedance Value: 418 Ohm
Lead Channel Impedance Value: 437 Ohm
Lead Channel Pacing Threshold Amplitude: 0.5 V
Lead Channel Pacing Threshold Amplitude: 0.625 V
Lead Channel Pacing Threshold Pulse Width: 0.4 ms
Lead Channel Pacing Threshold Pulse Width: 0.4 ms
Lead Channel Sensing Intrinsic Amplitude: 1.125 mV
Lead Channel Sensing Intrinsic Amplitude: 1.125 mV
Lead Channel Sensing Intrinsic Amplitude: 6.25 mV
Lead Channel Sensing Intrinsic Amplitude: 6.25 mV
Lead Channel Setting Pacing Amplitude: 1.5 V
Lead Channel Setting Pacing Amplitude: 2.5 V
Lead Channel Setting Pacing Pulse Width: 0.4 ms
Lead Channel Setting Sensing Sensitivity: 1.2 mV

## 2021-03-30 NOTE — Progress Notes (Signed)
Remote pacemaker transmission.   

## 2021-03-31 DIAGNOSIS — K5900 Constipation, unspecified: Secondary | ICD-10-CM | POA: Diagnosis not present

## 2021-03-31 DIAGNOSIS — K648 Other hemorrhoids: Secondary | ICD-10-CM | POA: Diagnosis not present

## 2021-05-05 DIAGNOSIS — I480 Paroxysmal atrial fibrillation: Secondary | ICD-10-CM | POA: Diagnosis not present

## 2021-05-05 DIAGNOSIS — I10 Essential (primary) hypertension: Secondary | ICD-10-CM | POA: Diagnosis not present

## 2021-05-05 DIAGNOSIS — K8689 Other specified diseases of pancreas: Secondary | ICD-10-CM | POA: Diagnosis not present

## 2021-05-05 DIAGNOSIS — D6869 Other thrombophilia: Secondary | ICD-10-CM | POA: Diagnosis not present

## 2021-05-05 DIAGNOSIS — E7800 Pure hypercholesterolemia, unspecified: Secondary | ICD-10-CM | POA: Diagnosis not present

## 2021-05-05 DIAGNOSIS — R42 Dizziness and giddiness: Secondary | ICD-10-CM | POA: Diagnosis not present

## 2021-05-05 DIAGNOSIS — Z8719 Personal history of other diseases of the digestive system: Secondary | ICD-10-CM | POA: Diagnosis not present

## 2021-06-25 ENCOUNTER — Ambulatory Visit (INDEPENDENT_AMBULATORY_CARE_PROVIDER_SITE_OTHER): Payer: Medicare Other

## 2021-06-25 DIAGNOSIS — I495 Sick sinus syndrome: Secondary | ICD-10-CM | POA: Diagnosis not present

## 2021-06-25 LAB — CUP PACEART REMOTE DEVICE CHECK
Battery Remaining Longevity: 154 mo
Battery Voltage: 3.03 V
Brady Statistic AP VP Percent: 0.04 %
Brady Statistic AP VS Percent: 54.83 %
Brady Statistic AS VP Percent: 0.02 %
Brady Statistic AS VS Percent: 45.14 %
Brady Statistic RA Percent Paced: 45.77 %
Brady Statistic RV Percent Paced: 2.28 %
Date Time Interrogation Session: 20230609044636
Implantable Lead Implant Date: 20210910
Implantable Lead Implant Date: 20210910
Implantable Lead Location: 753859
Implantable Lead Location: 753860
Implantable Lead Model: 5076
Implantable Lead Model: 5076
Implantable Pulse Generator Implant Date: 20210910
Lead Channel Impedance Value: 304 Ohm
Lead Channel Impedance Value: 380 Ohm
Lead Channel Impedance Value: 418 Ohm
Lead Channel Impedance Value: 551 Ohm
Lead Channel Pacing Threshold Amplitude: 0.625 V
Lead Channel Pacing Threshold Amplitude: 0.625 V
Lead Channel Pacing Threshold Pulse Width: 0.4 ms
Lead Channel Pacing Threshold Pulse Width: 0.4 ms
Lead Channel Sensing Intrinsic Amplitude: 0.75 mV
Lead Channel Sensing Intrinsic Amplitude: 0.75 mV
Lead Channel Sensing Intrinsic Amplitude: 6.125 mV
Lead Channel Sensing Intrinsic Amplitude: 6.125 mV
Lead Channel Setting Pacing Amplitude: 1.5 V
Lead Channel Setting Pacing Amplitude: 2.5 V
Lead Channel Setting Pacing Pulse Width: 0.4 ms
Lead Channel Setting Sensing Sensitivity: 1.2 mV

## 2021-07-01 NOTE — Progress Notes (Signed)
Remote pacemaker transmission.   

## 2021-07-12 DIAGNOSIS — H26493 Other secondary cataract, bilateral: Secondary | ICD-10-CM | POA: Diagnosis not present

## 2021-07-12 DIAGNOSIS — H16211 Exposure keratoconjunctivitis, right eye: Secondary | ICD-10-CM | POA: Diagnosis not present

## 2021-07-12 DIAGNOSIS — H0102A Squamous blepharitis right eye, upper and lower eyelids: Secondary | ICD-10-CM | POA: Diagnosis not present

## 2021-07-12 DIAGNOSIS — H0102B Squamous blepharitis left eye, upper and lower eyelids: Secondary | ICD-10-CM | POA: Diagnosis not present

## 2021-07-19 ENCOUNTER — Encounter (HOSPITAL_BASED_OUTPATIENT_CLINIC_OR_DEPARTMENT_OTHER): Payer: Self-pay | Admitting: Emergency Medicine

## 2021-07-19 ENCOUNTER — Other Ambulatory Visit: Payer: Self-pay

## 2021-07-19 ENCOUNTER — Emergency Department (HOSPITAL_BASED_OUTPATIENT_CLINIC_OR_DEPARTMENT_OTHER): Payer: Medicare Other

## 2021-07-19 ENCOUNTER — Emergency Department (HOSPITAL_BASED_OUTPATIENT_CLINIC_OR_DEPARTMENT_OTHER)
Admission: EM | Admit: 2021-07-19 | Discharge: 2021-07-19 | Disposition: A | Discharge status: Home / Self Care | Payer: Medicare Other | Attending: Emergency Medicine | Admitting: Emergency Medicine

## 2021-07-19 DIAGNOSIS — M79604 Pain in right leg: Secondary | ICD-10-CM | POA: Diagnosis not present

## 2021-07-19 DIAGNOSIS — Z7901 Long term (current) use of anticoagulants: Secondary | ICD-10-CM | POA: Insufficient documentation

## 2021-07-19 DIAGNOSIS — Z79899 Other long term (current) drug therapy: Secondary | ICD-10-CM | POA: Diagnosis not present

## 2021-07-19 DIAGNOSIS — M7989 Other specified soft tissue disorders: Secondary | ICD-10-CM | POA: Insufficient documentation

## 2021-07-19 DIAGNOSIS — R600 Localized edema: Secondary | ICD-10-CM | POA: Diagnosis not present

## 2021-07-19 DIAGNOSIS — R2240 Localized swelling, mass and lump, unspecified lower limb: Secondary | ICD-10-CM | POA: Diagnosis not present

## 2021-07-19 NOTE — ED Provider Notes (Cosign Needed Addendum)
[facility] EMERGENCY DEPT Provider Note   CSN: [id number] Arrival date & time: 07/19/21  1543     History  Chief Complaint  Patient presents with   Knee Pain    Danielle Colon is a 88 y.o. female.   Knee Pain  Patient is a 88 year old female presented to the emergency room today with complaints of right leg pain.  She states that she was sent from [facility] urgent care because of concern for DVT.  She states that she has had some right leg swelling for the past 2 weeks.  She denies any trauma or falls.  She denies any coldness or weakness.  She states that the pain is relatively mild but she has noticed the swelling.  She states she is still able to walk as she normally does.  No chest pain difficulty breathing no other associate symptoms.    Home Medications Prior to Admission medications   Medication Sig Start Date End Date Taking? Authorizing Provider  acetaminophen (TYLENOL) 325 MG tablet Take 2 tablets (650 mg total) by mouth every 6 (six) hours as needed for mild pain (or Fever >/= 101). 03/05/19   Hoyt Koch, MD  ascorbic acid (VITAMIN C) 500 MG tablet Take 500 mg by mouth daily.    [provider]  cholecalciferol (VITAMIN D3) 25 MCG (1000 UNIT) tablet Take 1,000 Units by mouth at bedtime.     [provider]  CREON 24000-76000 units CPEP Take 1 capsule by mouth 3 (three) times daily. 04/23/20   [provider]  diltiazem (CARDIZEM CD) 180 MG 24 hr capsule Take 1 capsule (180 mg total) by mouth daily. 01/08/20 12/08/20  Deboraha Sprang, MD  ELIQUIS 2.5 MG TABS tablet Take 2.5 mg by mouth 2 (two) times daily. 05/19/20   [provider]  meclizine (ANTIVERT) 25 MG tablet Take 1 tablet (25 mg total) by mouth 3 (three) times daily as needed. 12/09/20   Thurnell Lose, MD  pantoprazole (PROTONIX) 40 MG tablet Take 40 mg by mouth daily. 05/01/20   [provider]  losartan (COZAAR) 50 MG tablet Take 1 tablet (50 mg  total) by mouth daily. 07/24/19 10/11/19  Aline August, MD      Allergies    Carafate [sucralfate], Tessalon perles [benzonatate], Neurontin [gabapentin], and Zantac [ranitidine hcl]    Review of Systems   Review of Systems  Physical Exam Updated Vital Signs BP 128/68   Pulse 71   Temp 97.9 F (36.6 C)   Resp 16   Ht 5\' 2"  (1.575 m)   Wt 73.5 kg   SpO2 100%   BMI 29.63 kg/m  Physical Exam Vitals and nursing note reviewed.  Constitutional:      General: She is not in acute distress. HENT:     Head: Normocephalic and atraumatic.     Nose: Nose normal.  Eyes:     General: No scleral icterus. Cardiovascular:     Rate and Rhythm: Normal rate and regular rhythm.     Pulses: Normal pulses.     Heart sounds: Normal heart sounds.     Comments: DP PT pulses 3+ and symmetric Pulmonary:     Effort: Pulmonary effort is normal. No respiratory distress.     Breath sounds: No wheezing.     Comments: No crackles Abdominal:     Palpations: Abdomen is soft.     Tenderness: There is no abdominal tenderness.  Musculoskeletal:     Cervical back: Normal range  of motion.     Right lower leg: No edema.     Left lower leg: No edema.     Comments: Right lower extremity with nonpitting edema to the knee  Skin:    General: Skin is warm and dry.     Capillary Refill: Capillary refill takes less than 2 seconds.  Neurological:     Mental Status: She is alert. Mental status is at baseline.     Comments: Sensation symmetric in bilateral lower extremities  Psychiatric:        Mood and Affect: Mood normal.        Behavior: Behavior normal.     ED Results / Procedures / Treatments   Labs (all labs ordered are listed, but only abnormal results are displayed) Labs Reviewed - No data to display  EKG None  Radiology US Venous Img Lower Unilateral Right  Result Date: 07/19/2021 CLINICAL DATA:  Right calf pain and swelling for 1-2 weeks Fall 3 years ago EXAM: RIGHT LOWER EXTREMITY VENOUS  DOPPLER ULTRASOUND TECHNIQUE: Gray-scale sonography with compression, as well as color and duplex ultrasound, were performed to evaluate the deep venous system(s) from the level of the common femoral vein through the popliteal and proximal calf veins. COMPARISON:  None Available. FINDINGS: VENOUS Normal compressibility of the common femoral, superficial femoral, and popliteal veins, as well as the visualized calf veins. Visualized portions of profunda femoral vein and great saphenous vein unremarkable. No filling defects to suggest DVT on grayscale or color Doppler imaging. Doppler waveforms show normal direction of venous flow, normal respiratory plasticity and response to augmentation. Limited views of the contralateral common femoral vein are unremarkable. OTHER None. Limitations: none IMPRESSION: No right lower extremity DVT Electronically Signed   By: Acquanetta Belling M.D.   On: 07/19/2021 17:12    Procedures Procedures    Medications Ordered in ED Medications - No data to display  ED Course/ Medical Decision Making/ A&P                           Medical Decision Making  Patient is an 88 year old female present emergency room today with complaints of right leg swelling for 2 weeks.  Has had difficulty getting one of her PCP and went to urgent care where they referred her to ER because of concern for DVT.  I personally viewed images of DVT study which was negative for clot.  Offered labs which she declined because she would like to go home.  My exam is [place] with normal pulses and symmetric sensation good movement distally.  Doubt acute arterial disease, also doubt heart failure exacerbation given that patient has clear lungs and no JVP.  No cellulitic changes.  Low suspicion for fracture given no trauma history.  I discussed this case with my attending physician who cosigned this note including patient's presenting symptoms, physical exam, and planned diagnostics and interventions. Attending  physician stated agreement with plan or made changes to plan which were implemented.   Attending physician assessed patient at bedside.   Will discharge home at this time.  Patient and family member agreeable to follow-up with PCP.  Return precautions discussed    Final Clinical Impression(s) / ED Diagnoses Final diagnoses:  Right leg swelling    Rx / DC Orders ED Discharge Orders     None         Gailen Shelter, [place] 07/19/21 2029    Solon Augusta [place], [place] 07/19/21  2029    Milagros Loll, MD 07/20/21 1919

## 2021-07-19 NOTE — ED Triage Notes (Signed)
Pt sent from [facility] related to possible clot in right leg. Pt Started having pain in the back of her leg about 2 weeks ago.

## 2021-07-19 NOTE — Discharge Instructions (Addendum)
Please follow-up with your primary care provider.  Please continue taking your prescribed medications as prescribed.  You may elevate your leg to help with the swelling.

## 2021-08-04 DIAGNOSIS — I480 Paroxysmal atrial fibrillation: Secondary | ICD-10-CM | POA: Diagnosis not present

## 2021-08-04 DIAGNOSIS — I10 Essential (primary) hypertension: Secondary | ICD-10-CM | POA: Diagnosis not present

## 2021-08-04 DIAGNOSIS — K219 Gastro-esophageal reflux disease without esophagitis: Secondary | ICD-10-CM | POA: Diagnosis not present

## 2021-08-04 DIAGNOSIS — M25461 Effusion, right knee: Secondary | ICD-10-CM | POA: Diagnosis not present

## 2021-08-04 DIAGNOSIS — R600 Localized edema: Secondary | ICD-10-CM | POA: Diagnosis not present

## 2021-08-04 DIAGNOSIS — E7800 Pure hypercholesterolemia, unspecified: Secondary | ICD-10-CM | POA: Diagnosis not present

## 2021-08-04 DIAGNOSIS — N183 Chronic kidney disease, stage 3 unspecified: Secondary | ICD-10-CM | POA: Diagnosis not present

## 2021-08-13 DIAGNOSIS — M7989 Other specified soft tissue disorders: Secondary | ICD-10-CM | POA: Diagnosis not present

## 2021-08-24 DIAGNOSIS — M7989 Other specified soft tissue disorders: Secondary | ICD-10-CM | POA: Diagnosis not present

## 2021-08-24 DIAGNOSIS — M79674 Pain in right toe(s): Secondary | ICD-10-CM | POA: Diagnosis not present

## 2021-09-24 ENCOUNTER — Ambulatory Visit (INDEPENDENT_AMBULATORY_CARE_PROVIDER_SITE_OTHER): Payer: PPO

## 2021-09-24 DIAGNOSIS — I495 Sick sinus syndrome: Secondary | ICD-10-CM | POA: Diagnosis not present

## 2021-09-24 IMAGING — CT CT VIRTUAL COLONOSCOPY DIAGNOSTIC
3 of 9 series · 15 of 46 positions shown, 17 images · non-contrast
Comparison: CT abdomen pelvis 02/28/2019.

CLINICAL DATA: Severe constipation.

EXAM:
CT VIRTUAL COLONOSCOPY DIAGNOSTIC
TECHNIQUE: The patient was given a standard Lo so bowel preparation with
Gastrografin and barium for fluid and stool tagging respectively.
The quality of the bowel preparation is excellent. Automated CO2
insufflation of the colon was performed prior to image acquisition
and colonic distention is moderate to poor in the sigmoid colon,
otherwise moderate to excellent. Image post processing was used to
generate a 3D endoluminal fly-through projection of the colon and to
electronically subtract stool/fluid as appropriate.

[Series 3: supine colon 1.50 br40 s3 supine thins · axial · 0.87mm/px · z∈[+1193,+1556]mm · 9 of 304 slices shown, 11 images]
[im 31/304  soft-tissue]
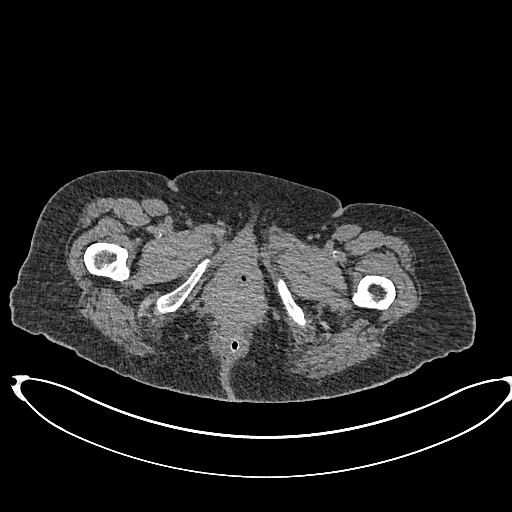
[im 31/304  bone]
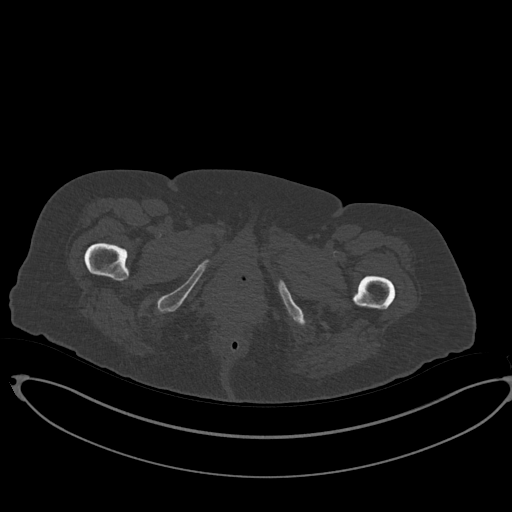
[im 61/304  soft-tissue]
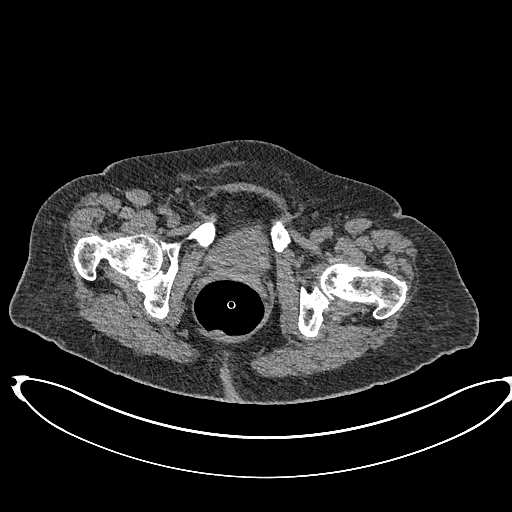
[im 91/304  soft-tissue]
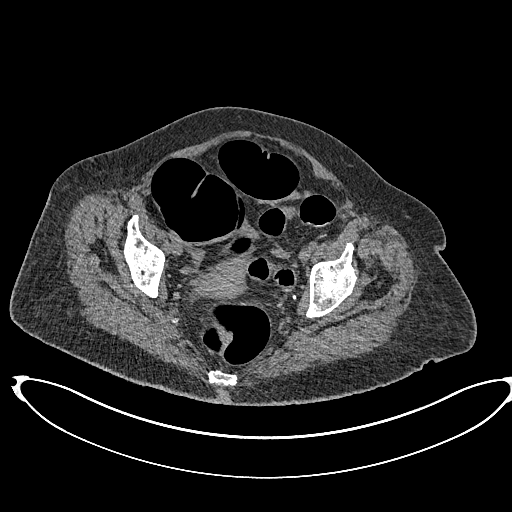
[im 122/304  soft-tissue]
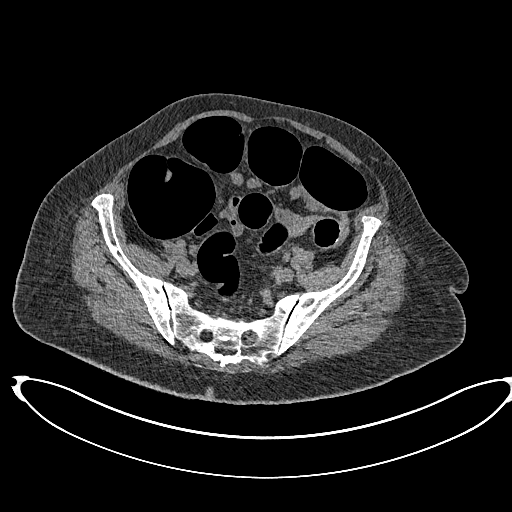
[im 152/304  soft-tissue]
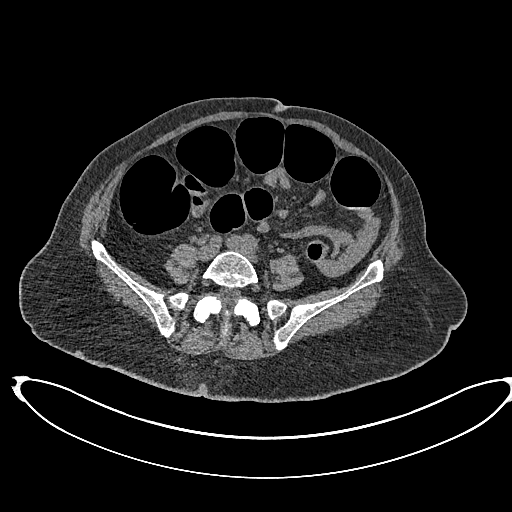
[im 182/304  soft-tissue]
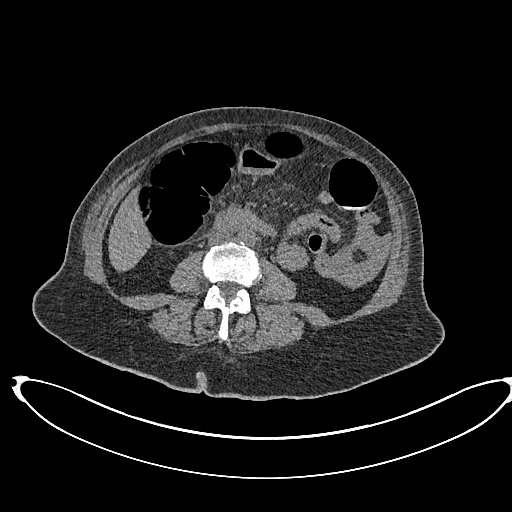
[im 213/304  soft-tissue]
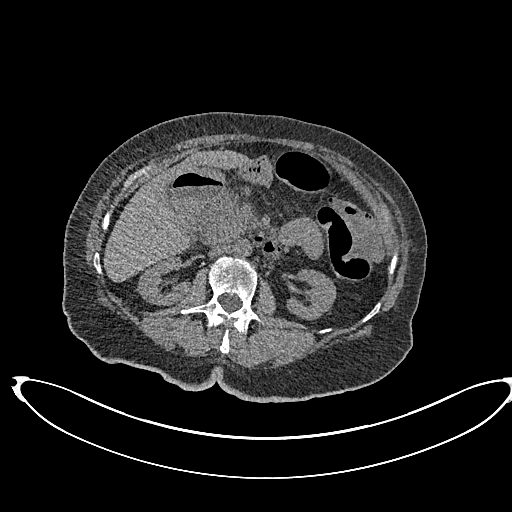
[im 243/304  soft-tissue]
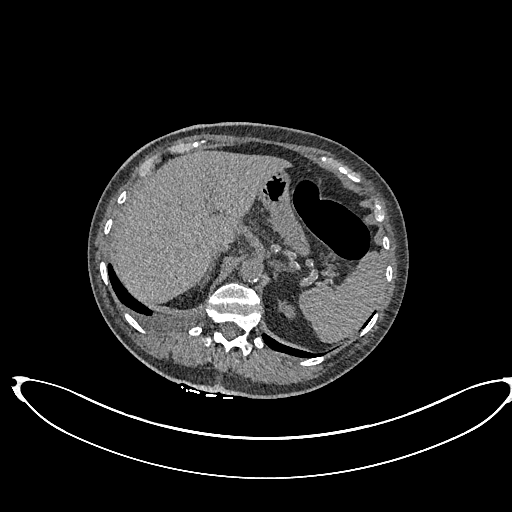
[im 273/304  soft-tissue]
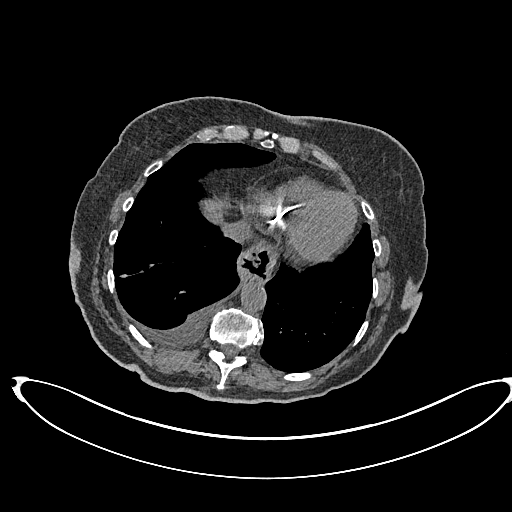
[im 273/304  bone]
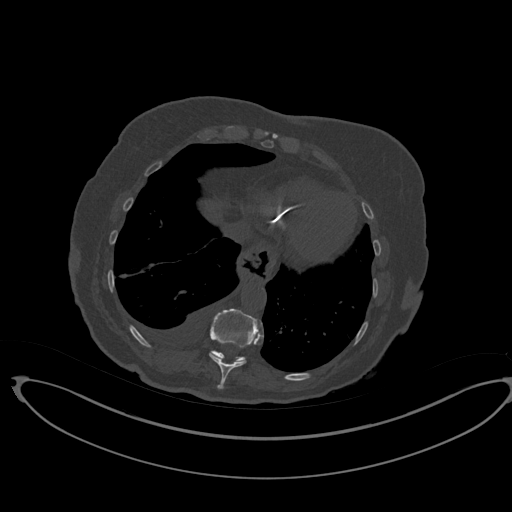

[Series 5: supine colon 3.00 br40 s3 cor supine · coronal · 0.81mm/px · 3 of 92 slices shown]
[im 23/92  soft-tissue]
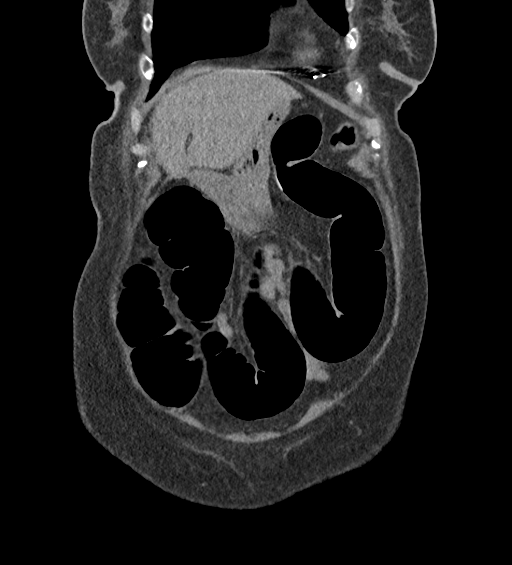
[im 46/92  soft-tissue]
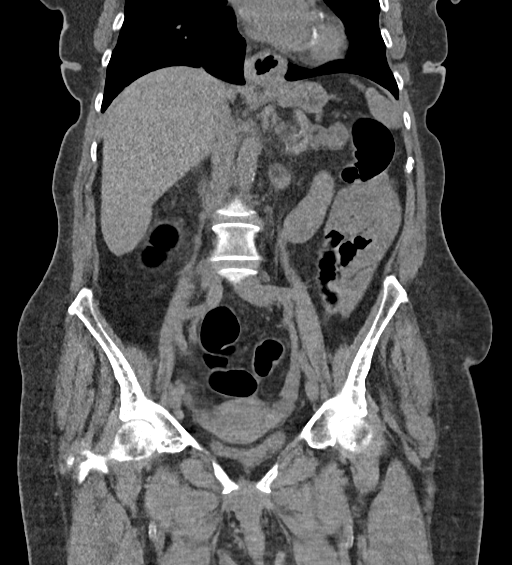
[im 69/92  soft-tissue]
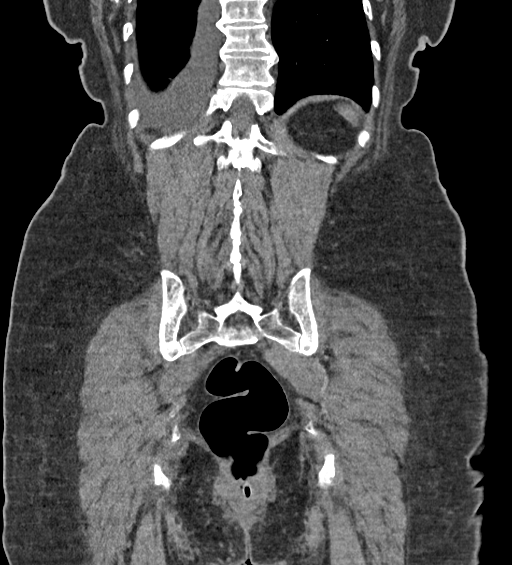

[Series 10: prone colon 1.50 br40 s3 prone thin · axial · 0.77mm/px · z∈[+1323,+1419]mm · 3 of 291 slices shown]
[im 33/291  soft-tissue]
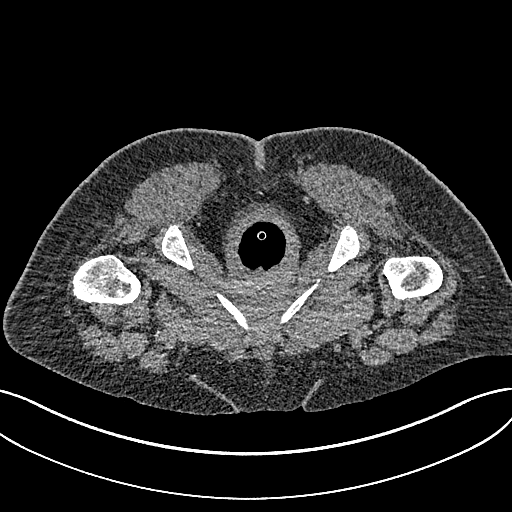
[im 65/291  soft-tissue]
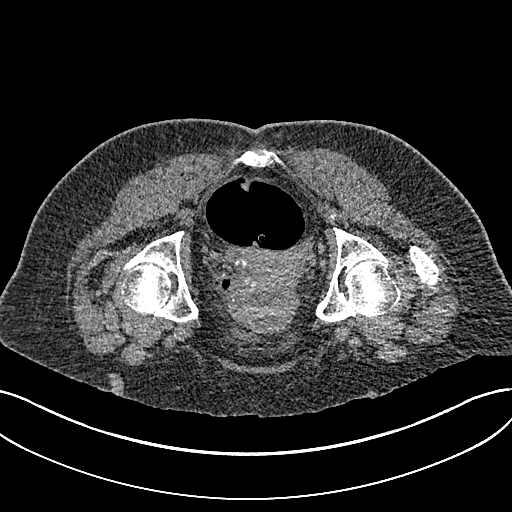
[im 97/291  soft-tissue]
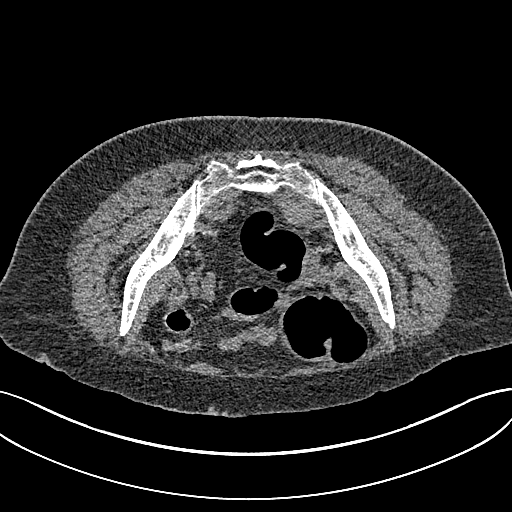

[15 of 46 positions shown; findings below may reference images not displayed]

FINDINGS: VIRTUAL COLONOSCOPY

There is mobile stool seen dependently in the transverse colon on
supine series 3, image 200 and prone series 10, image 158. Possible
10 mm polyp in the distal transverse or proximal sigmoid colon (3/97
and 10/69). On supine imaging, it is located approximately 111 cm
from the anal verge. Distance from the anal verge on prone imaging
is limited due to poor distension and 3D reconstruction. Otherwise,
no polyp, mass or stricture.

Virtual colonoscopy is not designed to detect diminutive polyps
(i.e., less than or equal to 5 mm), the presence or absence of which
may not affect clinical management.

CT ABDOMEN AND PELVIS WITHOUT CONTRAST

Lower chest: Small to moderate right pleural effusion. Linear
subsegmental volume loss in the lower lobes. Heart size normal. No
pericardial effusion. Small hiatal hernia.

Hepatobiliary: Liver is unremarkable. Cholecystectomy. No biliary
ductal dilatation.

Pancreas: Head appears somewhat full and ill-defined with adjacent
stranding, fluid and haziness. Appearance is similar to 02/28/2019.
Further evaluation is limited without IV contrast. Otherwise
unremarkable.

Spleen: Negative.

Adrenals/Urinary Tract: Adrenal glands and right kidney are
unremarkable. 12 mm low-attenuation lesion in the left kidney is
likely a cyst although definitive characterization is limited by
size and lack of postcontrast imaging. Ureters are decompressed.
Bladder is very low in volume.

Stomach/Bowel: Stomach is unremarkable. Duodenal diverticula are
incidentally noted. Small bowel is otherwise unremarkable. Patient
is reportedly status post appendectomy. Colon is discussed in the
virtual colonoscopy section of this report.

Vascular/Lymphatic: Atherosclerotic calcification of the aorta. No
pathologically enlarged lymph nodes.

Reproductive: Uterus is visualized.  No adnexal mass.

Other: There may be trace pelvic free fluid. Mesenteries and
peritoneum are otherwise unremarkable.

Musculoskeletal: Degenerative changes in the spine. No worrisome
lytic or sclerotic lesions.
IMPRESSION: 1. Suspected 10 mm polyp in the distal transverse or proximal
descending colon.
2. No additional colonic polyp, mass or stricture.
3. Small to moderate right pleural effusion.
4. Pancreatic head appears somewhat full and ill-defined with
adjacent stranding and fluid, similar to 02/28/2019. Please
correlate for acute pancreatitis.
5.  Aortic atherosclerosis ([id number]).

## 2021-09-28 LAB — CUP PACEART REMOTE DEVICE CHECK
Battery Remaining Longevity: 150 mo
Battery Voltage: 3.03 V
Brady Statistic AP VP Percent: 0.04 %
Brady Statistic AP VS Percent: 60.13 %
Brady Statistic AS VP Percent: 0.01 %
Brady Statistic AS VS Percent: 39.86 %
Brady Statistic RA Percent Paced: 47.32 %
Brady Statistic RV Percent Paced: 2.99 %
Date Time Interrogation Session: 20230907195419
Implantable Lead Implant Date: 20210910
Implantable Lead Implant Date: 20210910
Implantable Lead Location: 753859
Implantable Lead Location: 753860
Implantable Lead Model: 5076
Implantable Lead Model: 5076
Implantable Pulse Generator Implant Date: 20210910
Lead Channel Impedance Value: 285 Ohm
Lead Channel Impedance Value: 399 Ohm
Lead Channel Impedance Value: 437 Ohm
Lead Channel Impedance Value: 475 Ohm
Lead Channel Pacing Threshold Amplitude: 0.625 V
Lead Channel Pacing Threshold Amplitude: 0.625 V
Lead Channel Pacing Threshold Pulse Width: 0.4 ms
Lead Channel Pacing Threshold Pulse Width: 0.4 ms
Lead Channel Sensing Intrinsic Amplitude: 1.25 mV
Lead Channel Sensing Intrinsic Amplitude: 1.25 mV
Lead Channel Sensing Intrinsic Amplitude: 5.75 mV
Lead Channel Sensing Intrinsic Amplitude: 5.75 mV
Lead Channel Setting Pacing Amplitude: 1.5 V
Lead Channel Setting Pacing Amplitude: 2.5 V
Lead Channel Setting Pacing Pulse Width: 0.4 ms
Lead Channel Setting Sensing Sensitivity: 1.2 mV

## 2021-10-08 NOTE — Progress Notes (Signed)
Remote transmission scheduled for 12/08/2021.

## 2021-10-08 NOTE — Progress Notes (Signed)
Remote pacemaker transmission.   

## 2021-10-21 ENCOUNTER — Telehealth: Payer: Self-pay

## 2021-10-21 MED ORDER — DILTIAZEM HCL ER 120 MG PO CP12: 120.0000 mg | ORAL_CAPSULE | Freq: Two times a day (BID) | ORAL | 3 refills | Status: DC

## 2021-10-21 NOTE — Telephone Encounter (Signed)
Spoke with pt's daughter, Lisa,DPR and advised of device check review and recommendations per Dr Caryl Comes as below.  Pt's daughter verbalizes understanding and agrees with current plan.  Rx sent to [facility] as requested.

## 2021-10-21 NOTE — Telephone Encounter (Signed)
-----   Message from Deboraha Sprang, MD sent at 10/08/2021  1:34 PM EDT ----- Remote reviewed. This remote is abnormal for atrial fib with some rapid ventricular response Would like to hoave her hold her losartan and increase her dilt from 180 daily to 120 bid and see 1) how she feels and if ok 2) will recehcjk a transimission in about 2 months

## 2021-10-27 ENCOUNTER — Encounter (HOSPITAL_COMMUNITY): Payer: Self-pay

## 2021-10-27 ENCOUNTER — Other Ambulatory Visit: Payer: Self-pay

## 2021-10-27 ENCOUNTER — Inpatient Hospital Stay (HOSPITAL_BASED_OUTPATIENT_CLINIC_OR_DEPARTMENT_OTHER)
Admission: EM | Admit: 2021-10-27 | Discharge: 2021-10-29 | DRG: 439 | Disposition: A | Discharge status: Home Health | Payer: PPO | Attending: Internal Medicine | Admitting: Internal Medicine

## 2021-10-27 ENCOUNTER — Telehealth: Payer: Self-pay | Admitting: Internal Medicine

## 2021-10-27 ENCOUNTER — Inpatient Hospital Stay (HOSPITAL_COMMUNITY): Payer: PPO

## 2021-10-27 ENCOUNTER — Encounter (HOSPITAL_BASED_OUTPATIENT_CLINIC_OR_DEPARTMENT_OTHER): Payer: Self-pay

## 2021-10-27 DIAGNOSIS — K858 Other acute pancreatitis without necrosis or infection: Secondary | ICD-10-CM | POA: Diagnosis not present

## 2021-10-27 DIAGNOSIS — N1831 Chronic kidney disease, stage 3a: Secondary | ICD-10-CM | POA: Diagnosis not present

## 2021-10-27 DIAGNOSIS — K8500 Idiopathic acute pancreatitis without necrosis or infection: Principal | ICD-10-CM

## 2021-10-27 DIAGNOSIS — I4891 Unspecified atrial fibrillation: Secondary | ICD-10-CM | POA: Diagnosis present

## 2021-10-27 DIAGNOSIS — D72828 Other elevated white blood cell count: Secondary | ICD-10-CM | POA: Diagnosis present

## 2021-10-27 DIAGNOSIS — N179 Acute kidney failure, unspecified: Secondary | ICD-10-CM | POA: Diagnosis present

## 2021-10-27 DIAGNOSIS — I480 Paroxysmal atrial fibrillation: Secondary | ICD-10-CM | POA: Diagnosis not present

## 2021-10-27 DIAGNOSIS — Z7901 Long term (current) use of anticoagulants: Secondary | ICD-10-CM | POA: Diagnosis not present

## 2021-10-27 DIAGNOSIS — R739 Hyperglycemia, unspecified: Secondary | ICD-10-CM | POA: Diagnosis not present

## 2021-10-27 DIAGNOSIS — Z8249 Family history of ischemic heart disease and other diseases of the circulatory system: Secondary | ICD-10-CM

## 2021-10-27 DIAGNOSIS — E785 Hyperlipidemia, unspecified: Secondary | ICD-10-CM | POA: Diagnosis present

## 2021-10-27 DIAGNOSIS — I129 Hypertensive chronic kidney disease with stage 1 through stage 4 chronic kidney disease, or unspecified chronic kidney disease: Secondary | ICD-10-CM | POA: Diagnosis present

## 2021-10-27 DIAGNOSIS — Z9049 Acquired absence of other specified parts of digestive tract: Secondary | ICD-10-CM

## 2021-10-27 DIAGNOSIS — Z833 Family history of diabetes mellitus: Secondary | ICD-10-CM

## 2021-10-27 DIAGNOSIS — Z8673 Personal history of transient ischemic attack (TIA), and cerebral infarction without residual deficits: Secondary | ICD-10-CM | POA: Diagnosis not present

## 2021-10-27 DIAGNOSIS — K859 Acute pancreatitis without necrosis or infection, unspecified: Secondary | ICD-10-CM | POA: Diagnosis present

## 2021-10-27 DIAGNOSIS — Z809 Family history of malignant neoplasm, unspecified: Secondary | ICD-10-CM | POA: Diagnosis not present

## 2021-10-27 DIAGNOSIS — I10 Essential (primary) hypertension: Secondary | ICD-10-CM

## 2021-10-27 DIAGNOSIS — I495 Sick sinus syndrome: Secondary | ICD-10-CM | POA: Diagnosis present

## 2021-10-27 DIAGNOSIS — Z79899 Other long term (current) drug therapy: Secondary | ICD-10-CM | POA: Diagnosis not present

## 2021-10-27 DIAGNOSIS — I679 Cerebrovascular disease, unspecified: Secondary | ICD-10-CM | POA: Diagnosis present

## 2021-10-27 DIAGNOSIS — Z950 Presence of cardiac pacemaker: Secondary | ICD-10-CM | POA: Diagnosis not present

## 2021-10-27 DIAGNOSIS — K219 Gastro-esophageal reflux disease without esophagitis: Secondary | ICD-10-CM | POA: Diagnosis present

## 2021-10-27 DIAGNOSIS — I482 Chronic atrial fibrillation, unspecified: Secondary | ICD-10-CM | POA: Diagnosis present

## 2021-10-27 DIAGNOSIS — R109 Unspecified abdominal pain: Secondary | ICD-10-CM | POA: Diagnosis not present

## 2021-10-27 DIAGNOSIS — I700 Atherosclerosis of aorta: Secondary | ICD-10-CM | POA: Diagnosis not present

## 2021-10-27 LAB — COMPREHENSIVE METABOLIC PANEL
ALT: 8 U/L (ref 0–44)
AST: 12 U/L — ABNORMAL LOW (ref 15–41)
Albumin: 4.2 g/dL (ref 3.5–5.0)
Alkaline Phosphatase: 109 U/L (ref 38–126)
Anion gap: 9 (ref 5–15)
BUN: 18 mg/dL (ref 8–23)
CO2: 27 mmol/L (ref 22–32)
Calcium: 10 mg/dL (ref 8.9–10.3)
Chloride: 103 mmol/L (ref 98–111)
Creatinine, Ser: 1.44 mg/dL — ABNORMAL HIGH (ref 0.44–1.00)
GFR, Estimated: 35 mL/min — ABNORMAL LOW (ref >60)
Glucose, Bld: 136 mg/dL — ABNORMAL HIGH (ref 70–99)
Potassium: 3.9 mmol/L (ref 3.5–5.1)
Sodium: 139 mmol/L (ref 135–145)
Total Bilirubin: 0.4 mg/dL (ref 0.3–1.2)
Total Protein: 7.7 g/dL (ref 6.5–8.1)

## 2021-10-27 LAB — CBC WITH DIFFERENTIAL/PLATELET
Abs Immature Granulocytes: 0.04 10*3/uL (ref 0.00–0.07)
Basophils Absolute: 0 10*3/uL (ref 0.0–0.1)
Basophils Relative: 0 %
Eosinophils Absolute: 0.1 10*3/uL (ref 0.0–0.5)
Eosinophils Relative: 1 %
HCT: 40.6 % (ref 36.0–46.0)
Hemoglobin: 13.6 g/dL (ref 12.0–15.0)
Immature Granulocytes: 0 %
Lymphocytes Relative: 9 %
Lymphs Abs: 1.3 10*3/uL (ref 0.7–4.0)
MCH: 30.1 pg (ref 26.0–34.0)
MCHC: 33.5 g/dL (ref 30.0–36.0)
MCV: 89.8 fL (ref 80.0–100.0)
Monocytes Absolute: 0.8 10*3/uL (ref 0.1–1.0)
Monocytes Relative: 6 %
Neutro Abs: 12.3 10*3/uL — ABNORMAL HIGH (ref 1.7–7.7)
Neutrophils Relative %: 84 %
Platelets: 182 10*3/uL (ref 150–400)
RBC: 4.52 MIL/uL (ref 3.87–5.11)
RDW: 13.3 % (ref 11.5–15.5)
WBC: 14.6 10*3/uL — ABNORMAL HIGH (ref 4.0–10.5)
nRBC: 0 % (ref 0.0–0.2)

## 2021-10-27 LAB — HEMOGLOBIN A1C
Hgb A1c MFr Bld: 5.7 % — ABNORMAL HIGH (ref 4.8–5.6)
Mean Plasma Glucose: 116.89 mg/dL

## 2021-10-27 LAB — LIPASE, BLOOD: Lipase: 4095 U/L — ABNORMAL HIGH (ref 11–51)

## 2021-10-27 MED ORDER — PROCHLORPERAZINE EDISYLATE 10 MG/2ML IJ SOLN: 10.0000 mg | Freq: Four times a day (QID) | INTRAMUSCULAR | Status: DC | PRN

## 2021-10-27 MED ORDER — NAPHAZOLINE-GLYCERIN 0.012-0.25 % OP SOLN
1.0000 [drp] | Freq: Four times a day (QID) | OPHTHALMIC | Status: DC | PRN
  Filled 2021-10-27: qty 15

## 2021-10-27 MED ORDER — HYDROMORPHONE HCL 1 MG/ML IJ SOLN
0.5000 mg | INTRAMUSCULAR | Status: DC | PRN
  Administered 2021-10-27: 0.5 mg via INTRAVENOUS
  Filled 2021-10-27: qty 0.5

## 2021-10-27 MED ORDER — ONDANSETRON HCL 4 MG/2ML IJ SOLN
4.0000 mg | Freq: Once | INTRAMUSCULAR | Status: AC
  Administered 2021-10-27: 4 mg via INTRAVENOUS
  Filled 2021-10-27: qty 2

## 2021-10-27 MED ORDER — HYDROMORPHONE HCL 1 MG/ML IJ SOLN
0.5000 mg | Freq: Once | INTRAMUSCULAR | Status: AC
  Administered 2021-10-27: 0.5 mg via INTRAVENOUS
  Filled 2021-10-27: qty 1

## 2021-10-27 MED ORDER — MORPHINE SULFATE (PF) 2 MG/ML IV SOLN
2.0000 mg | Freq: Once | INTRAVENOUS | Status: AC
  Administered 2021-10-27: 2 mg via INTRAVENOUS
  Filled 2021-10-27: qty 1

## 2021-10-27 MED ORDER — PANTOPRAZOLE SODIUM 40 MG IV SOLR
40.0000 mg | INTRAVENOUS | Status: DC
  Administered 2021-10-27 – 2021-10-29 (×3): 40 mg via INTRAVENOUS
  Filled 2021-10-27 (×3): qty 10

## 2021-10-27 MED ORDER — SODIUM CHLORIDE 0.9 % IV BOLUS
1000.0000 mL | Freq: Once | INTRAVENOUS | Status: AC
  Administered 2021-10-27: 1000 mL via INTRAVENOUS

## 2021-10-27 MED ORDER — APIXABAN 2.5 MG PO TABS
2.5000 mg | ORAL_TABLET | Freq: Two times a day (BID) | ORAL | Status: DC
  Administered 2021-10-27 – 2021-10-29 (×5): 2.5 mg via ORAL
  Filled 2021-10-27 (×5): qty 1

## 2021-10-27 MED ORDER — SODIUM CHLORIDE 0.9 % IV SOLN: INTRAVENOUS | Status: DC

## 2021-10-27 MED ORDER — MORPHINE SULFATE (PF) 4 MG/ML IV SOLN: 4.0000 mg | INTRAVENOUS | Status: DC | PRN

## 2021-10-27 MED ORDER — DILTIAZEM HCL ER COATED BEADS 120 MG PO CP24
120.0000 mg | ORAL_CAPSULE | Freq: Two times a day (BID) | ORAL | Status: DC
  Administered 2021-10-27 – 2021-10-29 (×5): 120 mg via ORAL
  Filled 2021-10-27 (×5): qty 1

## 2021-10-27 NOTE — Plan of Care (Signed)
Patient arrived to the unit, she is oriented x4 and stable. Patient ambulated with standby assist from the stretcher to the bed, she endorsed dizziness with movement. Skin is intact and  VSS.

## 2021-10-27 NOTE — Telephone Encounter (Signed)
Pt c/o medication issue:  1. Name of Medication: not sure which medications will be changed  2. How are you currently taking this medication (dosage and times per day)?   3. Are you having a reaction (difficulty breathing--STAT)? no  4. What is your medication issue? Patient's daughter states she is returning a call about changing the patient's medications due to her pacemaker.

## 2021-10-27 NOTE — Progress Notes (Signed)
Mobility Specialist - Progress Note Pt refused mobility, had a "belly ache" and didn't want to mobilize to make it worse. Will check back in if schedule permits.   Roderick Pee Mobility Specialist

## 2021-10-27 NOTE — Progress Notes (Signed)
Patient with c/o continued pain despite having prescribed medication less than an hour ago. Pain reported 7/10 in abdomen

## 2021-10-27 NOTE — ED Triage Notes (Signed)
POV with daughter, BIB wheelchair, pt sts that upper abd pain began approx 2am with one bowel movement and one episode of vomiting, hx pancreatitis. Pt alert and oriented x 4.

## 2021-10-27 NOTE — H&P (Addendum)
History and Physical    Patient: Danielle Colon [id number] DOB: Jul 08, 1930 DOA: 10/27/2021 DOS: the patient was seen and examined on 10/27/2021 PCP: Daisy Floro, MD  Patient coming from: Home  Chief Complaint:  Chief Complaint  Patient presents with   Abdominal Pain   HPI: Danielle Colon is a 88 y.o. female with medical history significant of PAF, HTN, HLD, SSS s/p PPM, GERD. Presenting with abdominal pain. She reports she was in her normal state of health until early this morning when she began having abdominal pain. It was a "sharp, grabbing pain" located globally, but worse in the lower quadrants. She had some vomiting, but no hematemesis. She did not have any diarrhea or fever. She denies EtOH use. She she tried honey and APAP, but they did not help. She tried pacing her house, but it did not help either. When her symptoms did not improve, she decided to go to the ED for evaluation. She denies any other aggravating or alleviating factors.   Review of Systems: As mentioned in the history of present illness. All other systems reviewed and are negative. Past Medical History:  Diagnosis Date   Atrial fibrillation (HCC)    Bell's palsy 2009;    on the right   Brain TIA    pt denies this hx on 07/18/2013; "they thought it was a stroke but dr said later that it was Bell's Palsy"   Cerebrovascular disease    GERD (gastroesophageal reflux disease)    Hepatitis    "don't remember what kind; had it ~ 3 times when I was younger; last time was in the 1990's"   Hyperlipidemia    Hypertension    Pancreatitis    Past Surgical History:  Procedure Laterality Date   APPENDECTOMY  ~ 1971   CATARACT EXTRACTION Right    CHOLECYSTECTOMY  ~ 1971   PACEMAKER IMPLANT N/A 09/27/2019   Procedure: PACEMAKER IMPLANT;  Surgeon: Duke Salvia, MD;  Location: [facility] INVASIVE CV LAB;  Service: Cardiovascular;  Laterality: N/A;   TONSILLECTOMY  1949   Social History:  reports that she has never  smoked. She has never used smokeless tobacco. She reports that she does not drink alcohol and does not use drugs.  Allergies  Allergen Reactions   Carafate [Sucralfate] Other (See Comments)    Causes chills   Tessalon Perles [Benzonatate] Other (See Comments)    unknown   Neurontin [Gabapentin] Other (See Comments)    unknown   Zantac [Ranitidine Hcl] Other (See Comments)    GI upset    Family History  Problem Relation Age of Onset   Pneumonia Mother        old age   Heart attack Father    Diabetes Father    Diabetes Sister    Diabetes Brother    Cancer Sister    Diabetes Sister    Diabetes Sister     Prior to Admission medications   Medication Sig Start Date End Date Taking? Authorizing Provider  acetaminophen (TYLENOL) 325 MG tablet Take 2 tablets (650 mg total) by mouth every 6 (six) hours as needed for mild pain (or Fever >/= 101). 03/05/19   Myrlene Broker, MD  ascorbic acid (VITAMIN C) 500 MG tablet Take 500 mg by mouth daily.    [provider]  cholecalciferol (VITAMIN D3) 25 MCG (1000 UNIT) tablet Take 1,000 Units by mouth at bedtime.     [provider]  CREON 24000-76000 units CPEP Take 1  capsule by mouth 3 (three) times daily. 04/23/20   [provider]  diltiazem (CARDIZEM SR) 120 MG 12 hr capsule Take 1 capsule (120 mg total) by mouth 2 (two) times daily. 10/21/21   Deboraha Sprang, MD  ELIQUIS 2.5 MG TABS tablet Take 2.5 mg by mouth 2 (two) times daily. 05/19/20   [provider]  meclizine (ANTIVERT) 25 MG tablet Take 1 tablet (25 mg total) by mouth 3 (three) times daily as needed. 12/09/20   Thurnell Lose, MD  pantoprazole (PROTONIX) 40 MG tablet Take 40 mg by mouth daily. 05/01/20   [provider]  losartan (COZAAR) 50 MG tablet Take 1 tablet (50 mg total) by mouth daily. 07/24/19 10/11/19  Aline August, MD    Physical Exam: Vitals:   10/27/21 0530 10/27/21 0600 10/27/21 0730 10/27/21 0858  BP: (!) 157/65  (!) 153/63 (!) 147/67 137/72  Pulse: 65 72 79 68  Resp: (!) 22 14 17 18   Temp:    98.3 F (36.8 C)  TempSrc:    Oral  SpO2: 93% 94% 98% 98%  Weight:       General: 88 y.o. female resting in bed in NAD Eyes: PERRL, normal sclera ENMT: Nares patent w/o discharge, orophaynx clear, dentition normal, ears w/o discharge/lesions/ulcers Neck: Supple, trachea midline Cardiovascular: RRR, +S1, S2, no m/g/r, equal pulses throughout Respiratory: CTABL, no w/r/r, normal WOB GI: BS+, ND, TTP LUQ/LLQ mild, no masses noted, no organomegaly noted MSK: No c/c; BLE edema w/ chronic venous change (unchanged per patient) Neuro: A&O x 3, no focal deficits Psyc: Appropriate interaction and affect, calm/cooperative  Data Reviewed:  Results for orders placed or performed during the hospital encounter of 10/27/21 (from the past 24 hour(s))  Comprehensive metabolic panel     Status: Abnormal   Collection Time: 10/27/21  4:25 AM  Result Value Ref Range   Sodium 139 135 - 145 mmol/L   Potassium 3.9 3.5 - 5.1 mmol/L   Chloride 103 98 - 111 mmol/L   CO2 27 22 - 32 mmol/L   Glucose, Bld 136 (H) 70 - 99 mg/dL   BUN 18 8 - 23 mg/dL   Creatinine, Ser 1.44 (H) 0.44 - 1.00 mg/dL   Calcium 10.0 8.9 - 10.3 mg/dL   Total Protein 7.7 6.5 - 8.1 g/dL   Albumin 4.2 3.5 - 5.0 g/dL   AST 12 (L) 15 - 41 U/L   ALT 8 0 - 44 U/L   Alkaline Phosphatase 109 38 - 126 U/L   Total Bilirubin 0.4 0.3 - 1.2 mg/dL   GFR, Estimated 35 (L) >60 mL/min   Anion gap 9 5 - 15  Lipase, blood     Status: Abnormal   Collection Time: 10/27/21  4:25 AM  Result Value Ref Range   Lipase 4,095 (H) 11 - 51 U/L  CBC with Diff     Status: Abnormal   Collection Time: 10/27/21  4:25 AM  Result Value Ref Range   WBC 14.6 (H) 4.0 - 10.5 K/uL   RBC 4.52 3.87 - 5.11 MIL/uL   Hemoglobin 13.6 12.0 - 15.0 g/dL   HCT 40.6 36.0 - 46.0 %   MCV 89.8 80.0 - 100.0 fL   MCH 30.1 26.0 - 34.0 pg   MCHC 33.5 30.0 - 36.0 g/dL   RDW 13.3 11.5 - 15.5 %    Platelets 182 150 - 400 K/uL   nRBC 0.0 0.0 - 0.2 %   Neutrophils Relative % 84 %  Neutro Abs 12.3 (H) 1.7 - 7.7 K/uL   Lymphocytes Relative 9 %   Lymphs Abs 1.3 0.7 - 4.0 K/uL   Monocytes Relative 6 %   Monocytes Absolute 0.8 0.1 - 1.0 K/uL   Eosinophils Relative 1 %   Eosinophils Absolute 0.1 0.0 - 0.5 K/uL   Basophils Relative 0 %   Basophils Absolute 0.0 0.0 - 0.1 K/uL   Immature Granulocytes 0 %   Abs Immature Granulocytes 0.04 0.00 - 0.07 K/uL   EKG: sinus, no st elevation  Assessment and Plan: Acute pancreatitis     - placed in obs, tele     - check CT ab/pelvis, non-con     - NPO except ice chips, sips w/ meds     - pain control, anti-emetics, protonix  AKI on CKD3a     - fluids     - follow up CT     - watch nephrotoxins  PAF     - continue home regimen when confirmed  SSS s/p PPM     - continue home regimen when confirmed  HTN     - continue home regimen when confirmed  HLD     - continue home regimen when confirmed  GERD     - PPI  Hyperglycemia     - check A1c  Advance Care Planning:   Code Status: FULL  Consults: None  Family Communication: None at bedside  Severity of Illness: The appropriate patient status for this patient is OBSERVATION. Observation status is judged to be reasonable and necessary in order to provide the required intensity of service to ensure the patient's safety. The patient's presenting symptoms, physical exam findings, and initial radiographic and laboratory data in the context of their medical condition is felt to place them at decreased risk for further clinical deterioration. Furthermore, it is anticipated that the patient will be medically stable for discharge from the hospital within 2 midnights of admission.   Author: Teddy Spike, DO 10/27/2021 9:03 AM  For on call review [email or web address].

## 2021-10-27 NOTE — Telephone Encounter (Signed)
Returned call to patient's daughter Lattie Haw.  Lattie Haw states patient is not able to fill Rx for Diltiazem 120mg  12hr capsule BID that was ordered because she recently filled Diltiazem CD 120mg  24hr capsule on 10/14/21.   Lattie Haw asked if there was a difference between the 12hr capsule and 24hr capsule if the dose and frequency were the same. Lattie Haw states patient has been taking Diltiazem CD 24h capsule 120mg  BID since Spring 2023, prescribed and reduced from 180mg  by Dr. Lona Kettle (PCP).  Lattie Haw asked if patient could continue taking the 24hr capsule and refill the 12hr capsule that was ordered next month. She would have to pay $117 to fill it now.  Informed Lattie Haw I will need to check with Dr. Caryl Comes and his nurse on this matter and they will address when they return to the office.  Lattie Haw verbalized understanding and expressed appreciation for call.  Will forward to Dr. Olin Pia nurse to follow-up on this matter.

## 2021-10-27 NOTE — ED Provider Notes (Signed)
[facility] EMERGENCY DEPT Provider Note  CSN: [id number] Arrival date & time: 10/27/21 0410  Chief Complaint(s) Abdominal Pain  HPI Danielle Colon is a 88 y.o. female with a past medical history listed below including recurrent idiopathic pancreatitis here for 2 hours of epigastric and left upper quadrant abdominal pain consistent with her prior pancreatitis flares.  She had a bout of nonbloody nonbilious emesis while having a bowel movement 30 minutes prior to arrival prompting her visit.  She reports that she tried taking Tylenol for the pain which did not provide any relief.  She denies any recent fevers or infections.  No coughing or congestion.  No diarrhea.  The history is provided by the patient.    Past Medical History Past Medical History:  Diagnosis Date   Atrial fibrillation ([place])    Bell's palsy 2009;    on the right   Brain TIA    pt denies this hx on 07/18/2013; "they thought it was a stroke but dr said later that it was Bell's Palsy"   Cerebrovascular disease    GERD (gastroesophageal reflux disease)    Hepatitis    "don't remember what kind; had it ~ 3 times when I was younger; last time was in the 1990's"   Hyperlipidemia    Hypertension    Pancreatitis    Patient Active Problem List   Diagnosis Date Noted   Vertigo 12/08/2020   Hypercalcemia 12/08/2020   SSS (sick sinus syndrome) ([place]) 12/08/2020   Stage 3a chronic kidney disease (CKD) ([place]) 12/08/2020   Abnormal TSH 12/08/2020   Acute pancreatitis 05/22/2020   Sinus bradycardia 03/31/2020   Pacemaker 01/05/2020   Atrial flutter with rapid ventricular response ([place]) 01/05/2020   Syncope 07/23/2019   Atrial fibrillation ([place]) 07/22/2019   Atrial fibrillation with RVR ([place]) 07/22/2019   Syncope and collapse 07/22/2019   Hypokalemia 03/01/2019   Anemia 03/01/2019   Pancreatitis 02/28/2019   Hyperlipidemia 02/28/2019   Obesity (BMI 30.0-34.9) 02/28/2019   Hypoxia 02/28/2019   GERD  (gastroesophageal reflux disease) 07/19/2013   Urinary incontinence 07/19/2013   Lung nodule seen on imaging study 07/19/2013   Benign hypertension 07/18/2013   History of Bell's palsy 06/27/2013   Home Medication(s) Prior to Admission medications   Medication Sig Start Date End Date Taking? Authorizing Provider  acetaminophen (TYLENOL) 325 MG tablet Take 2 tablets (650 mg total) by mouth every 6 (six) hours as needed for mild pain (or Fever >/= 101). 03/05/19   Hoyt Koch, MD  ascorbic acid (VITAMIN C) 500 MG tablet Take 500 mg by mouth daily.    [provider]  cholecalciferol (VITAMIN D3) 25 MCG (1000 UNIT) tablet Take 1,000 Units by mouth at bedtime.     [provider]  CREON 24000-76000 units CPEP Take 1 capsule by mouth 3 (three) times daily. 04/23/20   [provider]  diltiazem (CARDIZEM SR) 120 MG 12 hr capsule Take 1 capsule (120 mg total) by mouth 2 (two) times daily. 10/21/21   Deboraha Sprang, MD  ELIQUIS 2.5 MG TABS tablet Take 2.5 mg by mouth 2 (two) times daily. 05/19/20   [provider]  meclizine (ANTIVERT) 25 MG tablet Take 1 tablet (25 mg total) by mouth 3 (three) times daily as needed. 12/09/20   Thurnell Lose, MD  pantoprazole (PROTONIX) 40 MG tablet Take 40 mg by mouth daily. 05/01/20   [provider]  losartan (COZAAR) 50 MG tablet Take 1 tablet (50 mg total)  by mouth daily. 07/24/19 10/11/19  Aline August, MD                                                                                                                                    Allergies Carafate [sucralfate], Tessalon perles [benzonatate], Neurontin [gabapentin], and Zantac [ranitidine hcl]  Review of Systems Review of Systems As noted in HPI  Physical Exam Vital Signs  I have reviewed the triage vital signs BP (!) 147/67   Pulse 79   Temp 98.6 F (37 C) (Oral)   Resp 17   Wt 73.5 kg   SpO2 98%   BMI 29.64 kg/m   Physical Exam Vitals  reviewed.  Constitutional:      General: She is not in acute distress.    Appearance: She is well-developed. She is not diaphoretic.  HENT:     Head: Normocephalic and atraumatic.     Right Ear: External ear normal.     Left Ear: External ear normal.     Nose: Nose normal.  Eyes:     General: No scleral icterus.    Conjunctiva/sclera: Conjunctivae normal.  Neck:     Trachea: Phonation normal.  Cardiovascular:     Rate and Rhythm: Normal rate. Rhythm regularly irregular.  Pulmonary:     Effort: Pulmonary effort is normal. No respiratory distress.     Breath sounds: No stridor.  Abdominal:     General: There is no distension.     Tenderness: There is abdominal tenderness in the epigastric area and left upper quadrant. There is no guarding or rebound.  Musculoskeletal:        General: Normal range of motion.     Cervical back: Normal range of motion.     Right lower leg: 2+ Edema present.     Left lower leg: 1+ Edema present.  Neurological:     Mental Status: She is alert and oriented to person, place, and time.  Psychiatric:        Behavior: Behavior normal.     ED Results and Treatments Labs (all labs ordered are listed, but only abnormal results are displayed) Labs Reviewed  COMPREHENSIVE METABOLIC PANEL - Abnormal; Notable for the following components:      Result Value   Glucose, Bld 136 (*)    Creatinine, Ser 1.44 (*)    AST 12 (*)    GFR, Estimated 35 (*)    All other components within normal limits  LIPASE, BLOOD - Abnormal; Notable for the following components:   Lipase 4,095 (*)    All other components within normal limits  CBC WITH DIFFERENTIAL/PLATELET - Abnormal; Notable for the following components:   WBC 14.6 (*)    Neutro Abs 12.3 (*)    All other components within normal limits  URINALYSIS, ROUTINE W REFLEX MICROSCOPIC  EKG  EKG  Interpretation  Date/Time:  Wednesday October 27 2021 04:33:14 EDT Ventricular Rate:  64 PR Interval:  210 QRS Duration: 83 QT Interval:  428 QTC Calculation: 442 R Axis:   -40 Text Interpretation: Sinus rhythm Atrial premature complex Left axis deviation Confirmed by Addison Lank [phone]) on 10/27/2021 4:46:27 AM       Radiology No results found.  Medications Ordered in ED Medications  sodium chloride 0.9 % bolus 1,000 mL (0 mLs Intravenous Stopped 10/27/21 0645)    And  0.9 %  sodium chloride infusion ( Intravenous Canceled Entry 10/27/21 0703)  ondansetron (ZOFRAN) injection 4 mg (4 mg Intravenous Given 10/27/21 0437)  HYDROmorphone (DILAUDID) injection 0.5 mg (0.5 mg Intravenous Given 10/27/21 0437)  morphine (PF) 2 MG/ML injection 2 mg (2 mg Intravenous Given 10/27/21 0729)  sodium chloride 0.9 % bolus 1,000 mL (1,000 mLs Intravenous New Bag/Given 10/27/21 0730)                                                                                                                                     Procedures .1-3 Lead EKG Interpretation  Performed by: Fatima Blank, MD Authorized by: Fatima Blank, MD     Interpretation: normal     ECG rate:  69   ECG rate assessment: normal     Rhythm: sinus rhythm     Ectopy: PAC     Conduction: normal     (including critical care time)  Medical Decision Making / ED Course   Medical Decision Making Amount and/or Complexity of Data Reviewed External Data Reviewed: notes.    Details: Admission from 05/2020 for pancreatitis flare noting it was idiopathic. Labs: ordered. Decision-making details documented in ED Course. Radiology:     Details: Considered but will deferred until lipase results ECG/medicine tests: ordered and independent interpretation performed. Decision-making details documented in ED Course.  Risk Prescription drug management. Parenteral controlled substances. Decision regarding  hospitalization.   Epigastric abdominal pain. Consistent with her prior flares of pancreatitis. Most likely etiology of her pain but will obtain labs to assess for other possible etiologies including gastroenteritis, colitis.  Will assess for any electrolyte/metabolic derangements or renal sufficiency.  CBC with leukocytosis.  No anemia. CMP without significant electrolyte derangements.  Mild hyperglycemia without evidence of DKA.  Mild renal sufficiency.  No evidence of biliary obstruction.   Lipase greater than 4000.  After IV pain medicine, patient pain improved.  Will defer CT imaging at this time. Patient provided with additional IV fluid boluses. Admitted to medicine for further work-up and management.       Final Clinical Impression(s) / ED Diagnoses Final diagnoses:  Idiopathic acute pancreatitis, unspecified complication status           This chart was dictated using voice recognition software.  Despite best efforts to proofread,  errors can occur which can change the documentation meaning.  Fatima Blank, MD 10/27/21 [phone]

## 2021-10-28 DIAGNOSIS — I495 Sick sinus syndrome: Secondary | ICD-10-CM

## 2021-10-28 DIAGNOSIS — I480 Paroxysmal atrial fibrillation: Secondary | ICD-10-CM

## 2021-10-28 DIAGNOSIS — K8500 Idiopathic acute pancreatitis without necrosis or infection: Secondary | ICD-10-CM | POA: Diagnosis not present

## 2021-10-28 DIAGNOSIS — R739 Hyperglycemia, unspecified: Secondary | ICD-10-CM

## 2021-10-28 DIAGNOSIS — K219 Gastro-esophageal reflux disease without esophagitis: Secondary | ICD-10-CM | POA: Diagnosis not present

## 2021-10-28 DIAGNOSIS — N179 Acute kidney failure, unspecified: Secondary | ICD-10-CM | POA: Diagnosis not present

## 2021-10-28 DIAGNOSIS — I10 Essential (primary) hypertension: Secondary | ICD-10-CM

## 2021-10-28 DIAGNOSIS — N1831 Chronic kidney disease, stage 3a: Secondary | ICD-10-CM

## 2021-10-28 DIAGNOSIS — E785 Hyperlipidemia, unspecified: Secondary | ICD-10-CM

## 2021-10-28 LAB — CBC
HCT: 36.2 % (ref 36.0–46.0)
Hemoglobin: 12.2 g/dL (ref 12.0–15.0)
MCH: 30.7 pg (ref 26.0–34.0)
MCHC: 33.7 g/dL (ref 30.0–36.0)
MCV: 91 fL (ref 80.0–100.0)
Platelets: 165 10*3/uL (ref 150–400)
RBC: 3.98 MIL/uL (ref 3.87–5.11)
RDW: 13.2 % (ref 11.5–15.5)
WBC: 9.3 10*3/uL (ref 4.0–10.5)
nRBC: 0 % (ref 0.0–0.2)

## 2021-10-28 LAB — COMPREHENSIVE METABOLIC PANEL
ALT: 146 U/L — ABNORMAL HIGH (ref 0–44)
AST: 125 U/L — ABNORMAL HIGH (ref 15–41)
Albumin: 3 g/dL — ABNORMAL LOW (ref 3.5–5.0)
Alkaline Phosphatase: 142 U/L — ABNORMAL HIGH (ref 38–126)
Anion gap: 7 (ref 5–15)
BUN: 10 mg/dL (ref 8–23)
CO2: 22 mmol/L (ref 22–32)
Calcium: 8.6 mg/dL — ABNORMAL LOW (ref 8.9–10.3)
Chloride: 106 mmol/L (ref 98–111)
Creatinine, Ser: 0.86 mg/dL (ref 0.44–1.00)
GFR, Estimated: >60 mL/min (ref >60)
Glucose, Bld: 81 mg/dL (ref 70–99)
Potassium: 3.7 mmol/L (ref 3.5–5.1)
Sodium: 135 mmol/L (ref 135–145)
Total Bilirubin: 1.7 mg/dL — ABNORMAL HIGH (ref 0.3–1.2)
Total Protein: 5.8 g/dL — ABNORMAL LOW (ref 6.5–8.1)

## 2021-10-28 LAB — TRIGLYCERIDES: Triglycerides: 52 mg/dL (ref <150)

## 2021-10-28 MED ORDER — PANCRELIPASE (LIP-PROT-AMYL) 12000-38000 UNITS PO CPEP
24000.0000 [IU] | ORAL_CAPSULE | Freq: Three times a day (TID) | ORAL | Status: DC
  Administered 2021-10-28 – 2021-10-29 (×3): 24000 [IU] via ORAL
  Filled 2021-10-28 (×3): qty 2

## 2021-10-28 NOTE — Progress Notes (Signed)
PROGRESS NOTE    Danielle Colon [facility]  NWG:[id number] DOB: 02-04-1930 DOA: 10/27/2021 PCP: Lawerance Cruel, MD    Brief Narrative:   Danielle Colon is a 88 y.o. female with past medical history significant for paroxysmal atrial fibrillation, essential hypertension, hyperlipidemia, SSS s/p PPM, GERD, CKD stage IIIa who presented to [facility] ED on 10/11 with progressive abdominal pain associated with nausea/vomiting.  Denies hematemesis, or diarrhea.  Denies EtOH abuse.  She attempted to utilize honey and APAP without improvement.  Denies any recent medication changes or sick contacts.  Given progression of symptoms, patient sought further evaluation in the ED.  In the ED, temperature 98.6 F, HR 67, RR 18, BP 160 71, SPO2 99% room air.  WBC count 14.6, hemoglobin 13.6, platelet 182.  Sodium 139, potassium 3.9, chloride 103, glucose 136, BUN 18, creatinine 1.44.  AST 12, ALT 8, total bilirubin 0.4.  Lipase 4095.  CT abdomen/pelvis with findings consistent with acute diffuse interstitial pancreatitis, small few small locules of gas noted possibly in the lower pancreatic head, some of this gas could be in the main pancreatic duct or common bile duct and related to a prior sphincterotomy but cannot exclude the possibility of mild emphysematous pancreatitis, associated inflammatory changes involving the duodenum along the duodenal diverticuli, small bilateral pleural effusions, right greater than left, large hiatal hernia, s/p cholecystectomy with stable mild intrahepatic biliary dilation, aortic atherosclerosis.  Patient was given IV fluid bolus, Dilaudid, morphine.  EDP consulted [facility] for admission and further evaluation and management of acute pancreatitis.  Assessment & Plan:   Acute pancreatitis Patient presenting to ED with progressive abdominal pain associate with nausea and vomiting.  Previous history of pancreatitis.  Denies alcohol abuse.  Lipase elevated 4095 on admission.  Triglyceride level 52,  within normal limits.  History of cholecystectomy. CT abdomen/pelvis with findings consistent with acute diffuse interstitial pancreatitis, small few small locules of gas noted possibly in the lower pancreatic head, some of this gas could be in the main pancreatic duct or common bile duct and related to a prior sphincterotomy but cannot exclude the possibility of mild emphysematous pancreatitis, associated inflammatory changes involving the duodenum along the duodenal diverticuli -- Advance diet to clears today -- Restart home Creon -- Continue IVF with NS at 125 mL/h -- Supportive care, pain control, antiemetics -- CMP, lipase daily  Transaminitis Unclear etiology, likely secondary to acute pancreatitis.  History of prior cholecystectomy, and CT notable for stable intrahepatic ductal dilation. -- AST 12>125 -- ALT 8>146 -- Tbili 0.4>1.7 -- Avoid hepatotoxins -- CMP daily  Paroxysmal atrial fibrillation Essential hypertension -- Diltiazem 120 mg p.o. twice daily -- Eliquis 2.5 mg p.o. twice daily  Sick sinus syndrome s/p PPM -- Continue monitor on telemetry -- Outpatient follow-up with cardiology  Acute renal failure on CKD stage IIIa Creatinine admission elevated 1.44, likely secondary to poor oral intake in the setting of nausea/vomiting from acute pancreatitis as above. -- Cr 1.44>0.86 -- Continue IV fluids as above. -- Avoid nephrotoxins, renal dose all medications -- CMP in a.m.  GERD: Protonix 40 mg IV every 24 hours   DVT prophylaxis: apixaban (ELIQUIS) tablet 2.5 mg Start: 10/27/21 1045 apixaban (ELIQUIS) tablet 2.5 mg    Code Status: Full Code Family Communication: No family present at bedside this morning  Disposition Plan:  Level of care: Telemetry Status is: Inpatient Remains inpatient appropriate because: Continue IV fluid hydration, slowly advance diet, anticipate discharge home in 1-2 days    Consultants:  None  Procedures:  None  Antimicrobials:   None   Subjective: Patient seen and examined at bedside, resting comfortably.  Sitting in bedside chair.  States nausea/vomiting resolved and no further abdominal pain.  Requesting advancement of diet.  Discussed will start with clears and then further advancement of full liquids if tolerates this evening.  No other specific questions or concerns at this time.  Denies headache, no dizziness, no chest pain, no palpitations, no shortness of breath, no current abdominal pain, no current nausea/vomiting, no diarrhea, no focal weakness, no cough/congestion, no fever/chills/night sweats, no fatigue, no paresthesias.  No acute events overnight per nursing staff.  Objective: Vitals:   10/27/21 1423 10/27/21 1657 10/27/21 2001 10/28/21 0420  BP: (!) 130/58 130/64 (!) 143/68 139/74  Pulse: 60 64 65 79  Resp: 17  20 18   Temp: 98 F (36.7 C) 98.4 F (36.9 C) 99.3 F (37.4 C) 99.6 F (37.6 C)  TempSrc: Oral Oral    SpO2: 96% 97% 97% 93%  Weight:      Height:        Intake/Output Summary (Last 24 hours) at 10/28/2021 1246 Last data filed at 10/28/2021 0441 Gross per 24 hour  Intake 572.92 ml  Output 1500 ml  Net -927.08 ml   Filed Weights   10/27/21 0429 10/27/21 0938  Weight: 73.5 kg 73.5 kg    Examination:  Physical Exam: GEN: NAD, alert and oriented x 3, elderly in appearance HEENT: NCAT, PERRL, EOMI, sclera clear, MMM PULM: CTAB w/o wheezes/crackles, normal respiratory effort on room air CV: RRR w/o M/G/R GI: abd soft, NTND, NABS, no R/G/M MSK: no peripheral edema, muscle strength globally intact 5/5 bilateral upper/lower extremities NEURO: CN II-XII intact, no focal deficits, sensation to light touch intact PSYCH: normal mood/affect Integumentary: dry/intact, no rashes or wounds    Data Reviewed: I have personally reviewed following labs and imaging studies  CBC: Recent Labs  Lab 10/27/21 0425 10/28/21 0501  WBC 14.6* 9.3  NEUTROABS 12.3*  --   HGB 13.6 12.2  HCT  40.6 36.2  MCV 89.8 91.0  PLT 182 [id number]   Basic Metabolic Panel: Recent Labs  Lab 10/27/21 0425 10/28/21 0501  NA 139 135  K 3.9 3.7  CL 103 106  CO2 27 22  GLUCOSE 136* 81  BUN 18 10  CREATININE 1.44* 0.86  CALCIUM 10.0 8.6*   GFR: Estimated Creatinine Clearance: 41.7 mL/min (by C-G formula based on SCr of 0.86 mg/dL). Liver Function Tests: Recent Labs  Lab 10/27/21 0425 10/28/21 0501  AST 12* 125*  ALT 8 146*  ALKPHOS 109 142*  BILITOT 0.4 1.7*  PROT 7.7 5.8*  ALBUMIN 4.2 3.0*   Recent Labs  Lab 10/27/21 0425  LIPASE 4,095*   No results for input(s): "AMMONIA" in the last 168 hours. Coagulation Profile: No results for input(s): "INR", "PROTIME" in the last 168 hours. Cardiac Enzymes: No results for input(s): "CKTOTAL", "CKMB", "CKMBINDEX", "TROPONINI" in the last 168 hours. BNP (last 3 results) No results for input(s): "PROBNP" in the last 8760 hours. HbA1C: Recent Labs    10/27/21 1019  HGBA1C 5.7*   CBG: No results for input(s): "GLUCAP" in the last 168 hours. Lipid Profile: Recent Labs    10/28/21 0501  TRIG 52   Thyroid Function Tests: No results for input(s): "TSH", "T4TOTAL", "FREET4", "T3FREE", "THYROIDAB" in the last 72 hours. Anemia Panel: No results for input(s): "VITAMINB12", "FOLATE", "FERRITIN", "TIBC", "IRON", "RETICCTPCT" in the last 72 hours. Sepsis Labs: No  results for input(s): "PROCALCITON", "LATICACIDVEN" in the last 168 hours.  No results found for this or any previous visit (from the past 240 hour(s)).       Radiology Studies: CT ABDOMEN PELVIS WO CONTRAST  Result Date: 10/27/2021 CLINICAL DATA:  Mid abdominal pain and markedly elevated lipase level suggesting pancreatitis. EXAM: CT ABDOMEN AND PELVIS WITHOUT CONTRAST TECHNIQUE: Multidetector CT imaging of the abdomen and pelvis was performed following the standard protocol without IV contrast. RADIATION DOSE REDUCTION: This exam was performed according to the  departmental dose-optimization program which includes automated exposure control, adjustment of the mA and/or kV according to patient size and/or use of iterative reconstruction technique. COMPARISON:  05/22/2020 FINDINGS: Lower chest: Small bilateral pleural effusions, right larger than left with overlying atelectasis and chronic basilar scarring changes. The heart is normal in size. The pacer wires are stable. Stable aortic and coronary artery calcifications. Stable large hiatal hernia. Hepatobiliary: No hepatic lesions are identified without contrast. Mild stable intrahepatic biliary dilatation. The gallbladder is surgically absent. Pancreas: Diffuse inflammation of the pancreas along with significant surrounding inflammatory changes. Findings consistent with acute diffuse interstitial pancreatitis. There are few small locules of gas noted possibly in the lower pancreatic head. Some of this gas could be in the main pancreatic duct or common bile duct and related to a prior sphincterotomy. There is a small duodenal diverticulum noted near the pancreatic head and a larger diverticulum projecting off the third portion of the duodenum. Spleen: Normal size.  No focal lesions. Adrenals/Urinary Tract: Adrenal glands and kidneys are unremarkable. Stomach/Bowel: Large hiatal hernia. The stomach is otherwise unremarkable. Marked inflammation of the second and third portions of the duodenum and duodenal diverticuli as detailed above. The small bowel and colon are grossly normal. No obstructive findings. Vascular/Lymphatic: Stable atherosclerotic calcifications involving the aorta and branch vessels. No aneurysm. No mesenteric or retroperitoneal mass or adenopathy. Reproductive: The uterus and ovaries are unremarkable and stable. Other: No ascites or abdominal hernia. Small amount of scattered abdominal and pelvic fluid associated with the patient's pancreatitis. Musculoskeletal: No significant bony findings. IMPRESSION: 1.  CT findings consistent with acute diffuse interstitial pancreatitis. There are few small locules of gas noted possibly in the lower pancreatic head. Some of this gas could be in the main pancreatic duct or common bile duct and related to a prior sphincterotomy. Could not exclude the possibility of mild emphysematous pancreatitis. 2. Associated inflammatory changes involving the duodenum along with duodenal diverticuli. 3. Small bilateral pleural effusions, right larger than left with overlying atelectasis and chronic basilar scarring changes. 4. Large hiatal hernia. 5. Status post cholecystectomy with stable mild intrahepatic biliary dilatation. 6. Aortic atherosclerosis. Aortic Atherosclerosis (ICD10-I70.0). Electronically Signed   By: Marijo Sanes M.D.   On: 10/27/2021 12:30        Scheduled Meds:  apixaban  2.5 mg Oral BID   diltiazem  120 mg Oral BID   pantoprazole (PROTONIX) IV  40 mg Intravenous Q24H   Continuous Infusions:  sodium chloride 125 mL/hr at 10/27/21 0949     LOS: 1 day    Time spent: 51 minutes spent on chart review, discussion with nursing staff, consultants, updating family and interview/physical exam; more than 50% of that time was spent in counseling and/or coordination of care.    Sebrena Engh J [place] DO [facility] Available via Epic secure chat 7am-7pm After these hours, please refer to coverage provider listed on [email or web address] 10/28/2021, 12:46 PM

## 2021-10-28 NOTE — Progress Notes (Signed)
Mobility Specialist - Progress Note   10/28/21 1346  Mobility  Activity Ambulated with assistance in hallway  Level of Assistance Contact guard assist, steadying assist  Assistive Device None  Distance Ambulated (ft) 110 ft  Activity Response Tolerated well  Mobility Referral Yes  $Mobility charge 1 Mobility   Pt received in chair and agreed for mobility, no c/o pain nor discomfort during ambulation, session short due to fatigue of not getting up in 2 days. Pt back to chair with all needs met.  Chase Moore Mobility Specialist   

## 2021-10-28 NOTE — Evaluation (Signed)
Physical Therapy Evaluation Patient Details Name: Danielle Colon MRN: [id number] DOB: [birth year] Today's Date: 10/28/2021  History of Present Illness  Patient is 88 y.o. female presented to [facility] ED on 10/11 with progressive abdominal pain associated with nausea/vomiting. PMH significant for paroxysmal atrial fibrillation, essential hypertension, hyperlipidemia, SSS s/p PPM, GERD, CKD stage IIIa. Pt admitted for acute pancreatitis.    Clinical Impression  Danielle Colon is 88 y.o. female admitted with above HPI and diagnosis. Patient is currently limited by functional impairments below (see PT problem list). Patient lives with her daughter and is modified independent with occasional use of AD at baseline. Currently pt requires min guard for transfers and gait with RW. Patient will benefit from continued skilled PT interventions to address impairments and progress independence with mobility, recommending HHPT. Acute PT will follow and progress as able.        Recommendations for follow up therapy are one component of a multi-disciplinary discharge planning process, led by the attending physician.  Recommendations may be updated based on patient status, additional functional criteria and insurance authorization.  Follow Up Recommendations Home health PT      Assistance Recommended at Discharge Intermittent Supervision/Assistance  Patient can return home with the following  A little help with walking and/or transfers;A little help with bathing/dressing/bathroom;Assistance with cooking/housework;Direct supervision/assist for medications management;Help with stairs or ramp for entrance;Assist for transportation    Equipment Recommendations None recommended by PT  Recommendations for Other Services       Functional Status Assessment Patient has had a recent decline in their functional status and demonstrates the ability to make significant improvements in function in a reasonable and  predictable amount of time.     Precautions / Restrictions Precautions Precautions: Fall Restrictions Weight Bearing Restrictions: No      Mobility  Bed Mobility               General bed mobility comments: OOB in recliner    Transfers Overall transfer level: Needs assistance Equipment used: Rolling walker (2 wheels) Transfers: Sit to/from Stand Sit to Stand: Min guard           General transfer comment: guarding for safety. pt with safe hand placement for power up. able to complete sit<>stand without UE use.    Ambulation/Gait Ambulation/Gait assistance: Min guard Gait Distance (Feet): 220 Feet Assistive device: Rolling walker (2 wheels) Gait Pattern/deviations: Step-through pattern, Decreased stride length, Trunk flexed Gait velocity: decr     General Gait Details: patient completed  Stairs            Wheelchair Mobility    Modified Rankin (Stroke Patients Only)       Balance Overall balance assessment: Needs assistance Sitting-balance support: Feet supported Sitting balance-Leahy Scale: Good     Standing balance support: Reliant on assistive device for balance, During functional activity, Bilateral upper extremity supported Standing balance-Leahy Scale: Fair                               Pertinent Vitals/Pain Pain Assessment Pain Assessment: No/denies pain    Home Living Family/patient expects to be discharged to:: Private residence Living Arrangements: Children Available Help at Discharge: Family;Available PRN/intermittently Type of Home: House Home Access: Level entry       Home Layout: One level Home Equipment: Conservation officer, nature (2 wheels)      Prior Function Prior Level of Function : Independent/Modified Independent;Driving  Mobility Comments: walks without a device ADLs Comments: takes a "bird bath"; still drives even though MD told her not to.     Hand Dominance        Extremity/Trunk  Assessment   Upper Extremity Assessment Upper Extremity Assessment: Overall WFL for tasks assessed    Lower Extremity Assessment Lower Extremity Assessment: Overall WFL for tasks assessed    Cervical / Trunk Assessment Cervical / Trunk Assessment: Kyphotic  Communication   Communication: No difficulties  Cognition Arousal/Alertness: Awake/alert Behavior During Therapy: WFL for tasks assessed/performed Overall Cognitive Status: Within Functional Limits for tasks assessed                                          General Comments General comments (skin integrity, edema, etc.): 5xSit<>Stand: no UE use, 28 seconds    Exercises     Assessment/Plan    PT Assessment Patient needs continued PT services  PT Problem List Decreased strength;Decreased activity tolerance;Decreased balance;Decreased mobility;Decreased knowledge of use of DME;Decreased safety awareness;Decreased knowledge of precautions       PT Treatment Interventions DME instruction;Gait training;Stair training;Functional mobility training;Therapeutic activities;Therapeutic exercise;Balance training;Patient/family education    PT Goals (Current goals can be found in the Care Plan section)  Acute Rehab PT Goals Patient Stated Goal: go home and remain independent PT Goal Formulation: With patient Time For Goal Achievement: 11/11/21 Potential to Achieve Goals: Good    Frequency Min 3X/week     Co-evaluation               AM-PAC PT "6 Clicks" Mobility  Outcome Measure Help needed turning from your back to your side while in a flat bed without using bedrails?: A Little Help needed moving from lying on your back to sitting on the side of a flat bed without using bedrails?: A Little Help needed moving to and from a bed to a chair (including a wheelchair)?: A Little Help needed standing up from a chair using your arms (e.g., wheelchair or bedside chair)?: A Little Help needed to walk in hospital  room?: A Little Help needed climbing 3-5 steps with a railing? : A Lot 6 Click Score: 17    End of Session Equipment Utilized During Treatment: Gait belt Activity Tolerance: Patient tolerated treatment well Patient left: in chair;with call bell/phone within reach Nurse Communication: Mobility status PT Visit Diagnosis: Muscle weakness (generalized) (M62.81);Difficulty in walking, not elsewhere classified (R26.2);Unsteadiness on feet (R26.81)    Time: [id number] PT Time Calculation (min) (ACUTE ONLY): 22 min   Charges:   PT Evaluation $PT Eval Low Complexity: 1 Low          Verner Mould, DPT Acute Rehabilitation Services Office [phone]  10/28/21 4:28 PM

## 2021-10-28 NOTE — Plan of Care (Signed)
Patient is awake and stable in bed. She denies pain or nausea at this time.

## 2021-10-29 DIAGNOSIS — N179 Acute kidney failure, unspecified: Secondary | ICD-10-CM | POA: Diagnosis not present

## 2021-10-29 DIAGNOSIS — K8500 Idiopathic acute pancreatitis without necrosis or infection: Secondary | ICD-10-CM | POA: Diagnosis not present

## 2021-10-29 DIAGNOSIS — K219 Gastro-esophageal reflux disease without esophagitis: Secondary | ICD-10-CM | POA: Diagnosis not present

## 2021-10-29 DIAGNOSIS — I480 Paroxysmal atrial fibrillation: Secondary | ICD-10-CM | POA: Diagnosis not present

## 2021-10-29 LAB — BASIC METABOLIC PANEL
Anion gap: 7 (ref 5–15)
BUN: 12 mg/dL (ref 8–23)
CO2: 24 mmol/L (ref 22–32)
Calcium: 8.8 mg/dL — ABNORMAL LOW (ref 8.9–10.3)
Chloride: 109 mmol/L (ref 98–111)
Creatinine, Ser: 0.79 mg/dL (ref 0.44–1.00)
GFR, Estimated: >60 mL/min (ref >60)
Glucose, Bld: 104 mg/dL — ABNORMAL HIGH (ref 70–99)
Potassium: 3.7 mmol/L (ref 3.5–5.1)
Sodium: 140 mmol/L (ref 135–145)

## 2021-10-29 LAB — CBC
HCT: 37.6 % (ref 36.0–46.0)
Hemoglobin: 12.7 g/dL (ref 12.0–15.0)
MCH: 30.5 pg (ref 26.0–34.0)
MCHC: 33.8 g/dL (ref 30.0–36.0)
MCV: 90.2 fL (ref 80.0–100.0)
Platelets: 173 10*3/uL (ref 150–400)
RBC: 4.17 MIL/uL (ref 3.87–5.11)
RDW: 13.3 % (ref 11.5–15.5)
WBC: 9.6 10*3/uL (ref 4.0–10.5)
nRBC: 0 % (ref 0.0–0.2)

## 2021-10-29 LAB — HEPATIC FUNCTION PANEL
ALT: 87 U/L — ABNORMAL HIGH (ref 0–44)
AST: 45 U/L — ABNORMAL HIGH (ref 15–41)
Albumin: 2.9 g/dL — ABNORMAL LOW (ref 3.5–5.0)
Alkaline Phosphatase: 134 U/L — ABNORMAL HIGH (ref 38–126)
Bilirubin, Direct: 0.2 mg/dL (ref 0.0–0.2)
Indirect Bilirubin: 0.7 mg/dL (ref 0.3–0.9)
Total Bilirubin: 0.9 mg/dL (ref 0.3–1.2)
Total Protein: 6 g/dL — ABNORMAL LOW (ref 6.5–8.1)

## 2021-10-29 LAB — LIPASE, BLOOD: Lipase: 83 U/L — ABNORMAL HIGH (ref 11–51)

## 2021-10-29 MED ORDER — DOCUSATE SODIUM 100 MG PO CAPS: 100.0000 mg | ORAL_CAPSULE | Freq: Two times a day (BID) | ORAL | 2 refills | Status: AC | PRN

## 2021-10-29 NOTE — Plan of Care (Signed)
Patient is alert and stable, she is denying pain and /or nausea. Patient has tolerated meal enhancements. IV line and telemetry monitor will be removed. AVS reviewed with patient and she acknowledged understanding. Daughter to transport her home.

## 2021-10-29 NOTE — TOC Transition Note (Signed)
Transition of Care [facility]) - CM/SW Discharge Note   Patient Details  Name: Danielle Colon MRN: [id number] Date of Birth: 12-07-1930  Transition of Care [facility]) CM/SW Contact:  Vassie Moselle, LCSW Phone Number: 10/29/2021, 9:27 AM   Clinical Narrative:    Met with pt and confirmed plan for home health. Pt reports she has had home health in the past but unsure of the agency. Pt is agreeable to having home health arranged. Pt currently resides with daughter and has RW at home. HHPT has been arranged with [facility]. No further needs identified at this time.   Final next level of care: [place] Barriers to Discharge: No Barriers Identified   Patient Goals and CMS Choice Patient states their goals for this hospitalization and ongoing recovery are:: To return home CMS Medicare.gov Compare Post Acute Care list provided to:: Patient Choice offered to / list presented to : Patient  Discharge Placement                       Discharge Plan and Services In-house Referral: NA Discharge Planning Services: CM Consult Post Acute Care Choice: Home Health          DME Arranged: N/A DME Agency: NA       HH Arranged: PT HH Agency: [place] Date [place]: 10/29/21 Time [place]: [phone] Representative spoke with at [place]: Amy  Social Determinants of Health ([place]) Interventions Housing Interventions: Intervention Not Indicated   Readmission Risk Interventions    10/29/2021    9:25 AM 10/28/2021    9:11 AM 03/05/2019   12:53 PM  Readmission Risk Prevention Plan  Post Dischage Appt Complete Complete   Medication Screening Complete Complete   Transportation Screening Complete Complete Complete  PCP or Specialist Appt within 5-7 Days   Not Complete  Home Care Screening   Complete  Medication Review (RN CM)   Referral to Pharmacy

## 2021-10-29 NOTE — Discharge Summary (Signed)
Physician Discharge Summary  [name] [facility] IEP:[id number] DOB: 19-May-1930 DOA: 10/27/2021  PCP: Lawerance Cruel, MD  Admit date: 10/27/2021 Discharge date: 10/29/2021  Admitted From: Home Disposition: Home Home  Recommendations for Outpatient Follow-up:  Follow up with PCP in 1-2 weeks Follow-up with [facility] gastroenterology, Dr. Alessandra Bevels Please obtain CMP in one week  Home Health: PT Equipment/Devices: None  Discharge Condition: Stable CODE STATUS: Full code Diet recommendation: Heart healthy/low-fat diet  History of present illness:  Danielle Colon is a 88 y.o. female with past medical history significant for paroxysmal atrial fibrillation, essential hypertension, hyperlipidemia, SSS s/p PPM, GERD, CKD stage IIIa who presented to [facility] ED on 10/11 with progressive abdominal pain associated with nausea/vomiting.  Denies hematemesis, or diarrhea.  Denies EtOH abuse.  She attempted to utilize honey and APAP without improvement.  Denies any recent medication changes or sick contacts.  Given progression of symptoms, patient sought further evaluation in the ED.   In the ED, temperature 98.6 F, HR 67, RR 18, BP 160 71, SPO2 99% room air.  WBC count 14.6, hemoglobin 13.6, platelet 182.  Sodium 139, potassium 3.9, chloride 103, glucose 136, BUN 18, creatinine 1.44.  AST 12, ALT 8, total bilirubin 0.4.  Lipase 4095.  CT abdomen/pelvis with findings consistent with acute diffuse interstitial pancreatitis, small few small locules of gas noted possibly in the lower pancreatic head, some of this gas could be in the main pancreatic duct or common bile duct and related to a prior sphincterotomy but cannot exclude the possibility of mild emphysematous pancreatitis, associated inflammatory changes involving the duodenum along the duodenal diverticuli, small bilateral pleural effusions, right greater than left, large hiatal hernia, s/p cholecystectomy with stable mild intrahepatic biliary dilation,  aortic atherosclerosis.  Patient was given IV fluid bolus, Dilaudid, morphine.  EDP consulted [facility] for admission and further evaluation and management of acute pancreatitis.  Hospital course:  Acute pancreatitis Patient presenting to ED with progressive abdominal pain associate with nausea and vomiting.  Previous history of pancreatitis.  Denies alcohol abuse.  Lipase elevated 4095 on admission.  Triglyceride level 52, within normal limits.  History of cholecystectomy. CT abdomen/pelvis with findings consistent with acute diffuse interstitial pancreatitis, small few small locules of gas noted possibly in the lower pancreatic head, some of this gas could be in the main pancreatic duct or common bile duct and related to a prior sphincterotomy but cannot exclude the possibility of mild emphysematous pancreatitis, associated inflammatory changes involving the duodenum along the duodenal diverticuli.  Patient was supported with IV fluid hydration and diet slowly advanced with toleration.  Discussed need to avoid fatty foods as this is likely etiology to her recurrent symptoms.  Continue home Creon.  Outpatient follow-up with GI.   Transaminitis Unclear etiology, likely secondary to acute pancreatitis.  History of prior cholecystectomy, and CT notable for stable intrahepatic ductal dilation.  LFTs were monitored and trended down during hospitalization.  Recommend repeat CMP 1 week.   Paroxysmal atrial fibrillation Essential hypertension Continue diltiazem 120 mg p.o. twice daily, Eliquis 2.5 mg p.o. twice daily.  Outpatient follow-up with cardiology.   Sick sinus syndrome s/p PPM Outpatient follow-up with cardiology   Acute renal failure on CKD stage IIIa Creatinine admission elevated 1.44, likely secondary to poor oral intake in the setting of nausea/vomiting from acute pancreatitis as above.  Creatinine improved to 0.79 at time of discharge.   GERD: Protonix 40 mg p.o. daily.  Discharge Diagnoses:   Principal Problem:   Acute  pancreatitis Active Problems:   HTN (hypertension)   GERD (gastroesophageal reflux disease)   Hyperlipidemia   Atrial fibrillation (HCC)   SSS (sick sinus syndrome) (HCC)   Stage 3a chronic kidney disease (CKD) (HCC)   AKI (acute kidney injury) ([place])   Hyperglycemia    Discharge Instructions  Discharge Instructions     Call MD for:  difficulty breathing, headache or visual disturbances   Complete by: As directed    Call MD for:  extreme fatigue   Complete by: As directed    Call MD for:  persistant dizziness or light-headedness   Complete by: As directed    Call MD for:  persistant nausea and vomiting   Complete by: As directed    Call MD for:  severe uncontrolled pain   Complete by: As directed    Call MD for:  temperature >100.4   Complete by: As directed    Diet - low sodium heart healthy   Complete by: As directed    Increase activity slowly   Complete by: As directed       Allergies as of 10/29/2021       Reactions   Carafate [sucralfate] Other (See Comments)   Rigor   Tessalon Perles [benzonatate] Other (See Comments)   Unknown reaction   Neurontin [gabapentin] Other (See Comments)   Unknown reaction   Zantac [ranitidine Hcl] Other (See Comments)   GI upset        Medication List     STOP taking these medications    diltiazem 120 MG 12 hr capsule Commonly known as: CARDIZEM SR       TAKE these medications    acetaminophen 500 MG tablet Commonly known as: TYLENOL Take 1,000 mg by mouth daily as needed for mild pain, fever or headache.   Creon 24000-76000 units Cpep Generic drug: Pancrelipase (Lip-Prot-Amyl) Take 1 capsule by mouth with breakfast, with lunch, and with evening meal.   diltiazem 120 MG 24 hr capsule Commonly known as: CARDIZEM CD Take 120 mg by mouth 2 (two) times daily.   docusate sodium 100 MG capsule Commonly known as: Colace Take 1 capsule (100 mg total) by mouth 2 (two) times daily as  needed for mild constipation.   DRY EYES OP Place 1 drop into the right eye 4 (four) times daily as needed (dry eyes).   Eliquis 2.5 MG Tabs tablet Generic drug: apixaban Take 2.5 mg by mouth 2 (two) times daily.   pantoprazole 40 MG tablet Commonly known as: PROTONIX Take 40 mg by mouth daily.        Follow-up Information     Lawerance Cruel, MD. Go to.   Specialty: Family Medicine Contact information: [place] [address] [phone]         Otis Brace, MD Follow up.   Specialty: Gastroenterology Contact information: [facility] [address] [place] [address] [phone]                Allergies  Allergen Reactions   Carafate [Sucralfate] Other (See Comments)    Rigor   Tessalon Perles [Benzonatate] Other (See Comments)    Unknown reaction   Neurontin [Gabapentin] Other (See Comments)    Unknown reaction    Zantac [Ranitidine Hcl] Other (See Comments)    GI upset    Consultations: None   Procedures/Studies: CT ABDOMEN PELVIS WO CONTRAST  Result Date: 10/27/2021 CLINICAL DATA:  Mid abdominal pain and markedly elevated lipase level suggesting pancreatitis.  EXAM: CT ABDOMEN AND PELVIS WITHOUT CONTRAST TECHNIQUE: Multidetector CT imaging of the abdomen and pelvis was performed following the standard protocol without IV contrast. RADIATION DOSE REDUCTION: This exam was performed according to the departmental dose-optimization program which includes automated exposure control, adjustment of the mA and/or kV according to patient size and/or use of iterative reconstruction technique. COMPARISON:  05/22/2020 FINDINGS: Lower chest: Small bilateral pleural effusions, right larger than left with overlying atelectasis and chronic basilar scarring changes. The heart is normal in size. The pacer wires are stable. Stable aortic and coronary artery calcifications. Stable large hiatal hernia. Hepatobiliary: No hepatic lesions are  identified without contrast. Mild stable intrahepatic biliary dilatation. The gallbladder is surgically absent. Pancreas: Diffuse inflammation of the pancreas along with significant surrounding inflammatory changes. Findings consistent with acute diffuse interstitial pancreatitis. There are few small locules of gas noted possibly in the lower pancreatic head. Some of this gas could be in the main pancreatic duct or common bile duct and related to a prior sphincterotomy. There is a small duodenal diverticulum noted near the pancreatic head and a larger diverticulum projecting off the third portion of the duodenum. Spleen: Normal size.  No focal lesions. Adrenals/Urinary Tract: Adrenal glands and kidneys are unremarkable. Stomach/Bowel: Large hiatal hernia. The stomach is otherwise unremarkable. Marked inflammation of the second and third portions of the duodenum and duodenal diverticuli as detailed above. The small bowel and colon are grossly normal. No obstructive findings. Vascular/Lymphatic: Stable atherosclerotic calcifications involving the aorta and branch vessels. No aneurysm. No mesenteric or retroperitoneal mass or adenopathy. Reproductive: The uterus and ovaries are unremarkable and stable. Other: No ascites or abdominal hernia. Small amount of scattered abdominal and pelvic fluid associated with the patient's pancreatitis. Musculoskeletal: No significant bony findings. IMPRESSION: 1. CT findings consistent with acute diffuse interstitial pancreatitis. There are few small locules of gas noted possibly in the lower pancreatic head. Some of this gas could be in the main pancreatic duct or common bile duct and related to a prior sphincterotomy. Could not exclude the possibility of mild emphysematous pancreatitis. 2. Associated inflammatory changes involving the duodenum along with duodenal diverticuli. 3. Small bilateral pleural effusions, right larger than left with overlying atelectasis and chronic basilar  scarring changes. 4. Large hiatal hernia. 5. Status post cholecystectomy with stable mild intrahepatic biliary dilatation. 6. Aortic atherosclerosis. Aortic Atherosclerosis (ICD10-I70.0). Electronically Signed   By: Marijo Sanes M.D.   On: 10/27/2021 12:30     Subjective: Patient seen examined bedside, resting comfortably.  No specific complaints this morning.  Tolerating advance diet.  Denies any abdominal pain.  Ready for discharge home.  Updated patient's daughter via telephone this morning.  No other questions or concerns at this time.  Discussed need for low-fat diet as likely precipitating etiology to her recurrent pancreatitis episodes.  Denies headache, no dizziness, no chest pain, no palpitations, no shortness of breath, no fever/chills/night sweats, no nausea/vomiting/diarrhea, no focal weakness, no fatigue, no cough/congestion, no paresthesias.  No acute events overnight per nursing staff.  Discharge Exam: Vitals:   10/29/21 0644 10/29/21 0659  BP: 131/74 128/80  Pulse: 93 (!) 106  Resp: 20   Temp: 97.8 F (36.6 C)   SpO2: 95% 93%   Vitals:   10/28/21 1331 10/28/21 2014 10/29/21 0644 10/29/21 0659  BP: 123/70 135/60 131/74 128/80  Pulse: 66 73 93 (!) 106  Resp: 17 18 20    Temp: 97.9 F (36.6 C) 98.5 F (36.9 C) 97.8 F (36.6 C)  TempSrc: Oral Oral Oral   SpO2: 100% 98% 95% 93%  Weight:      Height:        Physical Exam: GEN: NAD, alert and oriented x 3, elderly in appearance HEENT: NCAT, PERRL, right chronic eyebrow droop, EOMI, sclera clear, MMM PULM: CTAB w/o wheezes/crackles, normal respiratory effort, on room air CV: RRR w/o M/G/R GI: abd soft, NTND, NABS, no R/G/M MSK: no peripheral edema, muscle strength globally intact 5/5 bilateral upper/lower extremities NEURO: CN II-XII intact, no focal deficits, sensation to light touch intact PSYCH: normal mood/affect Integumentary: dry/intact, no rashes or wounds    The results of significant diagnostics from this  hospitalization (including imaging, microbiology, ancillary and laboratory) are listed below for reference.     Microbiology: No results found for this or any previous visit (from the past 240 hour(s)).   Labs: BNP (last 3 results) Recent Labs    12/08/20 0900  BNP [id number]   Basic Metabolic Panel: Recent Labs  Lab 10/27/21 0425 10/28/21 0501 10/29/21 0448  NA 139 135 140  K 3.9 3.7 3.7  CL 103 106 109  CO2 27 22 24   GLUCOSE 136* 81 104*  BUN 18 10 12   CREATININE 1.44* 0.86 0.79  CALCIUM 10.0 8.6* 8.8*   Liver Function Tests: Recent Labs  Lab 10/27/21 0425 10/28/21 0501 10/29/21 0448  AST 12* 125* 45*  ALT 8 146* 87*  ALKPHOS 109 142* 134*  BILITOT 0.4 1.7* 0.9  PROT 7.7 5.8* 6.0*  ALBUMIN 4.2 3.0* 2.9*   Recent Labs  Lab 10/27/21 0425 10/29/21 0448  LIPASE 4,095* 83*   No results for input(s): "AMMONIA" in the last 168 hours. CBC: Recent Labs  Lab 10/27/21 0425 10/28/21 0501 10/29/21 0448  WBC 14.6* 9.3 9.6  NEUTROABS 12.3*  --   --   HGB 13.6 12.2 12.7  HCT 40.6 36.2 37.6  MCV 89.8 91.0 90.2  PLT 182 165 173   Cardiac Enzymes: No results for input(s): "CKTOTAL", "CKMB", "CKMBINDEX", "TROPONINI" in the last 168 hours. BNP: Invalid input(s): "POCBNP" CBG: No results for input(s): "GLUCAP" in the last 168 hours. D-Dimer No results for input(s): "DDIMER" in the last 72 hours. Hgb A1c Recent Labs    10/27/21 1019  HGBA1C 5.7*   Lipid Profile Recent Labs    10/28/21 0501  TRIG 52   Thyroid function studies No results for input(s): "TSH", "T4TOTAL", "T3FREE", "THYROIDAB" in the last 72 hours.  Invalid input(s): "FREET3" Anemia work up No results for input(s): "VITAMINB12", "FOLATE", "FERRITIN", "TIBC", "IRON", "RETICCTPCT" in the last 72 hours. Urinalysis    Component Value Date/Time   COLORURINE COLORLESS (A) 12/08/2020 0845   APPEARANCEUR CLEAR 12/08/2020 0845   LABSPEC 1.007 12/08/2020 0845   PHURINE 7.0 12/08/2020 0845    GLUCOSEU NEGATIVE 12/08/2020 0845   HGBUR NEGATIVE 12/08/2020 0845   BILIRUBINUR NEGATIVE 12/08/2020 0845   KETONESUR NEGATIVE 12/08/2020 0845   PROTEINUR NEGATIVE 12/08/2020 0845   NITRITE NEGATIVE 12/08/2020 0845   LEUKOCYTESUR NEGATIVE 12/08/2020 0845   Sepsis Labs Recent Labs  Lab 10/27/21 0425 10/28/21 0501 10/29/21 0448  WBC 14.6* 9.3 9.6   Microbiology No results found for this or any previous visit (from the past 240 hour(s)).   Time coordinating discharge: Over 30 minutes  SIGNED:   [place] J [place] DO  [facility] 10/29/2021, 9:07 AM

## 2021-10-29 NOTE — Progress Notes (Signed)
       CROSS COVER NOTE  NAME: Danielle Colon MRN: [id number] DOB : January 02, 1931    Date of Service   10/29/2021   HPI/Events of Note   Notified by RN of patient's EKG reading A-fib w/ HR 120's.  Patient has a known history of paroxysmal atrial fibrillation and is already on diltiazem 120 mg twice daily.  Patient is otherwise stable and does mention feeling palpitations.  Patient denies chest pain, dizziness, shortness of breath.  Telemetry currently read heart rate in 100's.  RN was asked to give oral Cardizem early.   Interventions/ Plan   No change in regimen, oral Cardizem given early.     Raenette Rover, DNP, [place]

## 2021-10-29 NOTE — Progress Notes (Signed)
Mobility Specialist - Progress Note Pt refused mobility, wanted to wait for lunch, also being DC.   Roderick Pee Mobility Specialist

## 2021-10-29 NOTE — Telephone Encounter (Signed)
Spoke with pt's daughter who states pt was just discharged from the hospital for acute pancreatitis.  She states pt's Diltiazem was reduced previously by PCP from Diltiazem-CD180mg  to 120mg  bid.   Pt's daughter advised will discuss with Dr Caryl Comes and contact her next week.  Pt's daughter verbalizes understanding and agrees with current plan.

## 2021-11-02 DIAGNOSIS — Z8673 Personal history of transient ischemic attack (TIA), and cerebral infarction without residual deficits: Secondary | ICD-10-CM | POA: Diagnosis not present

## 2021-11-02 DIAGNOSIS — I495 Sick sinus syndrome: Secondary | ICD-10-CM | POA: Diagnosis not present

## 2021-11-02 DIAGNOSIS — M6281 Muscle weakness (generalized): Secondary | ICD-10-CM | POA: Diagnosis not present

## 2021-11-02 DIAGNOSIS — Z7901 Long term (current) use of anticoagulants: Secondary | ICD-10-CM | POA: Diagnosis not present

## 2021-11-02 DIAGNOSIS — E785 Hyperlipidemia, unspecified: Secondary | ICD-10-CM | POA: Diagnosis not present

## 2021-11-02 DIAGNOSIS — I480 Paroxysmal atrial fibrillation: Secondary | ICD-10-CM | POA: Diagnosis not present

## 2021-11-02 DIAGNOSIS — N1831 Chronic kidney disease, stage 3a: Secondary | ICD-10-CM | POA: Diagnosis not present

## 2021-11-02 DIAGNOSIS — Z8619 Personal history of other infectious and parasitic diseases: Secondary | ICD-10-CM | POA: Diagnosis not present

## 2021-11-02 DIAGNOSIS — K859 Acute pancreatitis without necrosis or infection, unspecified: Secondary | ICD-10-CM | POA: Diagnosis not present

## 2021-11-02 DIAGNOSIS — K219 Gastro-esophageal reflux disease without esophagitis: Secondary | ICD-10-CM | POA: Diagnosis not present

## 2021-11-02 DIAGNOSIS — I129 Hypertensive chronic kidney disease with stage 1 through stage 4 chronic kidney disease, or unspecified chronic kidney disease: Secondary | ICD-10-CM | POA: Diagnosis not present

## 2021-11-02 DIAGNOSIS — R2689 Other abnormalities of gait and mobility: Secondary | ICD-10-CM | POA: Diagnosis not present

## 2021-11-05 DIAGNOSIS — R899 Unspecified abnormal finding in specimens from other organs, systems and tissues: Secondary | ICD-10-CM | POA: Diagnosis not present

## 2021-11-05 DIAGNOSIS — Z6831 Body mass index (BMI) 31.0-31.9, adult: Secondary | ICD-10-CM | POA: Diagnosis not present

## 2021-11-05 DIAGNOSIS — E7800 Pure hypercholesterolemia, unspecified: Secondary | ICD-10-CM | POA: Diagnosis not present

## 2021-11-05 DIAGNOSIS — I10 Essential (primary) hypertension: Secondary | ICD-10-CM | POA: Diagnosis not present

## 2021-11-05 DIAGNOSIS — K859 Acute pancreatitis without necrosis or infection, unspecified: Secondary | ICD-10-CM | POA: Diagnosis not present

## 2021-11-05 DIAGNOSIS — Z09 Encounter for follow-up examination after completed treatment for conditions other than malignant neoplasm: Secondary | ICD-10-CM | POA: Diagnosis not present

## 2021-11-09 DIAGNOSIS — R899 Unspecified abnormal finding in specimens from other organs, systems and tissues: Secondary | ICD-10-CM | POA: Diagnosis not present

## 2021-11-09 DIAGNOSIS — I10 Essential (primary) hypertension: Secondary | ICD-10-CM | POA: Diagnosis not present

## 2021-11-09 DIAGNOSIS — E7800 Pure hypercholesterolemia, unspecified: Secondary | ICD-10-CM | POA: Diagnosis not present

## 2021-11-09 DIAGNOSIS — K859 Acute pancreatitis without necrosis or infection, unspecified: Secondary | ICD-10-CM | POA: Diagnosis not present

## 2021-11-18 NOTE — Telephone Encounter (Signed)
As best as I can find, and she should check with GI, is that,despite [employer] saying yes, literature reviews 2014 and 1761 DO NOT implicate CCB with pancreatitis and so, we should have her increase her dilt to 180 bid

## 2021-11-18 NOTE — Telephone Encounter (Signed)
Spoke with pt's daughter,DPR and advised of Dr Olin Pia recommendation as below.  Pt's daughter states she is not sure what dose of Diltiazem pt is taking or how she is taking it but she will confirm with pt and call back.  Provided office number of [phone].  She thanked Therapist, sports for the call.

## 2021-11-19 NOTE — Telephone Encounter (Signed)
Pt c/o medication issue:  1. Name of Medication:  diltiazem (CARDIZEM CD) 120 MG 24 hr capsule  2. How are you currently taking this medication (dosage and times per day)?   3. Are you having a reaction (difficulty breathing--STAT)?   4. What is your medication issue?   Patient's daughter is following up to confirm dose. She states patient takes Diltiazem 24-hr 120 MG capsule twice daily by mouth. Last prescribed on 10/30 by PCP, Dr. Harrington Challenger.

## 2021-11-24 MED ORDER — DILTIAZEM HCL ER COATED BEADS 180 MG PO CP24: 180.0000 mg | ORAL_CAPSULE | Freq: Two times a day (BID) | ORAL | 11 refills | Status: DC

## 2021-11-24 NOTE — Telephone Encounter (Signed)
Spoke with pt's daughter,DPR and advised per Dr Graciela Husbands pt should increase Diltiazem 180mg  - 1 tablet by mouth twice daily.  Pt's daughter verbalizes understanding and agrees with current plan.

## 2021-11-29 ENCOUNTER — Encounter (HOSPITAL_COMMUNITY): Payer: Self-pay

## 2021-11-29 ENCOUNTER — Inpatient Hospital Stay (HOSPITAL_BASED_OUTPATIENT_CLINIC_OR_DEPARTMENT_OTHER)
Admission: EM | Admit: 2021-11-29 | Discharge: 2021-12-02 | DRG: 439 | Disposition: A | Discharge status: Home / Self Care | Payer: PPO | Attending: Internal Medicine | Admitting: Internal Medicine

## 2021-11-29 ENCOUNTER — Encounter (HOSPITAL_BASED_OUTPATIENT_CLINIC_OR_DEPARTMENT_OTHER): Payer: Self-pay | Admitting: Emergency Medicine

## 2021-11-29 ENCOUNTER — Emergency Department (HOSPITAL_BASED_OUTPATIENT_CLINIC_OR_DEPARTMENT_OTHER): Payer: PPO

## 2021-11-29 ENCOUNTER — Other Ambulatory Visit: Payer: Self-pay

## 2021-11-29 DIAGNOSIS — Z7901 Long term (current) use of anticoagulants: Secondary | ICD-10-CM

## 2021-11-29 DIAGNOSIS — N1831 Chronic kidney disease, stage 3a: Secondary | ICD-10-CM | POA: Diagnosis not present

## 2021-11-29 DIAGNOSIS — I10 Essential (primary) hypertension: Secondary | ICD-10-CM

## 2021-11-29 DIAGNOSIS — Z9109 Other allergy status, other than to drugs and biological substances: Secondary | ICD-10-CM

## 2021-11-29 DIAGNOSIS — I480 Paroxysmal atrial fibrillation: Secondary | ICD-10-CM

## 2021-11-29 DIAGNOSIS — I4891 Unspecified atrial fibrillation: Secondary | ICD-10-CM | POA: Diagnosis present

## 2021-11-29 DIAGNOSIS — I482 Chronic atrial fibrillation, unspecified: Secondary | ICD-10-CM | POA: Diagnosis present

## 2021-11-29 DIAGNOSIS — K8689 Other specified diseases of pancreas: Secondary | ICD-10-CM | POA: Diagnosis not present

## 2021-11-29 DIAGNOSIS — J90 Pleural effusion, not elsewhere classified: Secondary | ICD-10-CM | POA: Diagnosis not present

## 2021-11-29 DIAGNOSIS — Z950 Presence of cardiac pacemaker: Secondary | ICD-10-CM | POA: Diagnosis not present

## 2021-11-29 DIAGNOSIS — E781 Pure hyperglyceridemia: Secondary | ICD-10-CM | POA: Diagnosis present

## 2021-11-29 DIAGNOSIS — K449 Diaphragmatic hernia without obstruction or gangrene: Secondary | ICD-10-CM | POA: Diagnosis present

## 2021-11-29 DIAGNOSIS — Z9049 Acquired absence of other specified parts of digestive tract: Secondary | ICD-10-CM

## 2021-11-29 DIAGNOSIS — I679 Cerebrovascular disease, unspecified: Secondary | ICD-10-CM | POA: Diagnosis not present

## 2021-11-29 DIAGNOSIS — K851 Biliary acute pancreatitis without necrosis or infection: Principal | ICD-10-CM | POA: Diagnosis present

## 2021-11-29 DIAGNOSIS — K219 Gastro-esophageal reflux disease without esophagitis: Secondary | ICD-10-CM | POA: Diagnosis not present

## 2021-11-29 DIAGNOSIS — N179 Acute kidney failure, unspecified: Secondary | ICD-10-CM | POA: Diagnosis present

## 2021-11-29 DIAGNOSIS — Z66 Do not resuscitate: Secondary | ICD-10-CM | POA: Diagnosis not present

## 2021-11-29 DIAGNOSIS — K861 Other chronic pancreatitis: Secondary | ICD-10-CM | POA: Diagnosis not present

## 2021-11-29 DIAGNOSIS — Z809 Family history of malignant neoplasm, unspecified: Secondary | ICD-10-CM | POA: Diagnosis not present

## 2021-11-29 DIAGNOSIS — N289 Disorder of kidney and ureter, unspecified: Secondary | ICD-10-CM | POA: Diagnosis not present

## 2021-11-29 DIAGNOSIS — Z8249 Family history of ischemic heart disease and other diseases of the circulatory system: Secondary | ICD-10-CM | POA: Diagnosis not present

## 2021-11-29 DIAGNOSIS — Z833 Family history of diabetes mellitus: Secondary | ICD-10-CM | POA: Diagnosis not present

## 2021-11-29 DIAGNOSIS — Z79899 Other long term (current) drug therapy: Secondary | ICD-10-CM | POA: Diagnosis not present

## 2021-11-29 DIAGNOSIS — R932 Abnormal findings on diagnostic imaging of liver and biliary tract: Secondary | ICD-10-CM | POA: Diagnosis not present

## 2021-11-29 DIAGNOSIS — K859 Acute pancreatitis without necrosis or infection, unspecified: Secondary | ICD-10-CM | POA: Diagnosis not present

## 2021-11-29 DIAGNOSIS — I129 Hypertensive chronic kidney disease with stage 1 through stage 4 chronic kidney disease, or unspecified chronic kidney disease: Secondary | ICD-10-CM | POA: Diagnosis present

## 2021-11-29 DIAGNOSIS — R748 Abnormal levels of other serum enzymes: Secondary | ICD-10-CM | POA: Diagnosis not present

## 2021-11-29 DIAGNOSIS — K858 Other acute pancreatitis without necrosis or infection: Secondary | ICD-10-CM | POA: Diagnosis not present

## 2021-11-29 DIAGNOSIS — Z888 Allergy status to other drugs, medicaments and biological substances status: Secondary | ICD-10-CM

## 2021-11-29 DIAGNOSIS — R109 Unspecified abdominal pain: Secondary | ICD-10-CM | POA: Diagnosis not present

## 2021-11-29 DIAGNOSIS — I4821 Permanent atrial fibrillation: Secondary | ICD-10-CM | POA: Diagnosis present

## 2021-11-29 LAB — CBC
HCT: 39.2 % (ref 36.0–46.0)
Hemoglobin: 13.1 g/dL (ref 12.0–15.0)
MCH: 30.3 pg (ref 26.0–34.0)
MCHC: 33.4 g/dL (ref 30.0–36.0)
MCV: 90.5 fL (ref 80.0–100.0)
Platelets: 183 K/uL (ref 150–400)
RBC: 4.33 MIL/uL (ref 3.87–5.11)
RDW: 13.2 % (ref 11.5–15.5)
WBC: 10.9 K/uL — ABNORMAL HIGH (ref 4.0–10.5)
nRBC: 0 % (ref 0.0–0.2)

## 2021-11-29 LAB — COMPREHENSIVE METABOLIC PANEL WITH GFR
ALT: 15 U/L (ref 0–44)
AST: 17 U/L (ref 15–41)
Albumin: 4.3 g/dL (ref 3.5–5.0)
Alkaline Phosphatase: 99 U/L (ref 38–126)
Anion gap: 11 (ref 5–15)
BUN: 15 mg/dL (ref 8–23)
CO2: 26 mmol/L (ref 22–32)
Calcium: 10.1 mg/dL (ref 8.9–10.3)
Chloride: 102 mmol/L (ref 98–111)
Creatinine, Ser: 1.18 mg/dL — ABNORMAL HIGH (ref 0.44–1.00)
GFR, Estimated: 44 mL/min — ABNORMAL LOW
Glucose, Bld: 175 mg/dL — ABNORMAL HIGH (ref 70–99)
Potassium: 3.7 mmol/L (ref 3.5–5.1)
Sodium: 139 mmol/L (ref 135–145)
Total Bilirubin: 0.5 mg/dL (ref 0.3–1.2)
Total Protein: 7.5 g/dL (ref 6.5–8.1)

## 2021-11-29 LAB — TROPONIN I (HIGH SENSITIVITY): Troponin I (High Sensitivity): 10 ng/L (ref <18)

## 2021-11-29 LAB — LIPASE, BLOOD: Lipase: 5772 U/L — ABNORMAL HIGH (ref 11–51)

## 2021-11-29 LAB — TRIGLYCERIDES: Triglycerides: 64 mg/dL (ref <150)

## 2021-11-29 MED ORDER — ONDANSETRON HCL 4 MG PO TABS: 4.0000 mg | ORAL_TABLET | Freq: Four times a day (QID) | ORAL | Status: DC | PRN

## 2021-11-29 MED ORDER — LACTATED RINGERS IV SOLN: INTRAVENOUS | Status: DC

## 2021-11-29 MED ORDER — ONDANSETRON HCL 4 MG/2ML IJ SOLN: 4.0000 mg | Freq: Four times a day (QID) | INTRAMUSCULAR | Status: DC | PRN

## 2021-11-29 MED ORDER — ORAL CARE MOUTH RINSE: 15.0000 mL | OROMUCOSAL | Status: DC | PRN

## 2021-11-29 MED ORDER — DILTIAZEM HCL ER COATED BEADS 180 MG PO CP24
180.0000 mg | ORAL_CAPSULE | Freq: Two times a day (BID) | ORAL | Status: DC
  Administered 2021-11-29 – 2021-12-02 (×6): 180 mg via ORAL
  Filled 2021-11-29 (×6): qty 1

## 2021-11-29 MED ORDER — KETOROLAC TROMETHAMINE 15 MG/ML IJ SOLN
15.0000 mg | Freq: Once | INTRAMUSCULAR | Status: AC
  Administered 2021-11-29: 15 mg via INTRAVENOUS
  Filled 2021-11-29: qty 1

## 2021-11-29 MED ORDER — ACETAMINOPHEN 650 MG RE SUPP: 650.0000 mg | Freq: Four times a day (QID) | RECTAL | Status: DC | PRN

## 2021-11-29 MED ORDER — PANTOPRAZOLE SODIUM 40 MG IV SOLR
40.0000 mg | INTRAVENOUS | Status: DC
  Administered 2021-11-29 – 2021-12-02 (×4): 40 mg via INTRAVENOUS
  Filled 2021-11-29 (×4): qty 10

## 2021-11-29 MED ORDER — HYDRALAZINE HCL 20 MG/ML IJ SOLN: 5.0000 mg | INTRAMUSCULAR | Status: DC | PRN

## 2021-11-29 MED ORDER — IOHEXOL 300 MG/ML  SOLN
100.0000 mL | Freq: Once | INTRAMUSCULAR | Status: AC | PRN
  Administered 2021-11-29: 75 mL via INTRAVENOUS

## 2021-11-29 MED ORDER — ONDANSETRON HCL 4 MG/2ML IJ SOLN
4.0000 mg | Freq: Once | INTRAMUSCULAR | Status: AC
  Administered 2021-11-29: 4 mg via INTRAVENOUS
  Filled 2021-11-29: qty 2

## 2021-11-29 MED ORDER — SODIUM CHLORIDE 0.9 % IV SOLN: Freq: Once | INTRAVENOUS | Status: AC

## 2021-11-29 MED ORDER — SODIUM CHLORIDE 0.9 % IV BOLUS
1000.0000 mL | Freq: Once | INTRAVENOUS | Status: AC
  Administered 2021-11-29: 1000 mL via INTRAVENOUS

## 2021-11-29 MED ORDER — MORPHINE SULFATE (PF) 2 MG/ML IV SOLN: 2.0000 mg | INTRAVENOUS | Status: DC | PRN

## 2021-11-29 MED ORDER — ACETAMINOPHEN 325 MG PO TABS
650.0000 mg | ORAL_TABLET | Freq: Four times a day (QID) | ORAL | Status: DC | PRN
  Administered 2021-12-01: 650 mg via ORAL
  Filled 2021-11-29: qty 2

## 2021-11-29 MED ORDER — APIXABAN 2.5 MG PO TABS
2.5000 mg | ORAL_TABLET | Freq: Two times a day (BID) | ORAL | Status: DC
  Administered 2021-11-29 – 2021-11-30 (×3): 2.5 mg via ORAL
  Filled 2021-11-29 (×3): qty 1

## 2021-11-29 MED ORDER — MORPHINE SULFATE (PF) 4 MG/ML IV SOLN
4.0000 mg | Freq: Once | INTRAVENOUS | Status: AC
  Administered 2021-11-29: 4 mg via INTRAVENOUS
  Filled 2021-11-29: qty 1

## 2021-11-29 MED ORDER — HYDROMORPHONE HCL 1 MG/ML IJ SOLN
0.5000 mg | Freq: Once | INTRAMUSCULAR | Status: AC
  Administered 2021-11-29: 0.5 mg via INTRAVENOUS
  Filled 2021-11-29: qty 1

## 2021-11-29 NOTE — ED Triage Notes (Signed)
10/10 abd pain starting tonight, states nature of pain feels like previous episodes of pancreatitis but that the pain is higher than previously.  Endorses constipation, N/V.  Has tried home medications for pain but unable to remember names.

## 2021-11-29 NOTE — Assessment & Plan Note (Addendum)
-  Rate controlled with Diltiazem -Has pacemaker -Continue Eliquis

## 2021-11-29 NOTE — Plan of Care (Signed)

## 2021-11-29 NOTE — Assessment & Plan Note (Signed)
-  Continue Diltiazem

## 2021-11-29 NOTE — Consult Note (Signed)
Initial Consultation Note   Patient: Danielle Colon WUJ:[id number] DOB: 12/29/1930 PCP: Daisy Florooss, Charles Alan, MD DOA: 11/29/2021 DOS: the patient was seen and examined on 11/29/2021 Primary service: Pricilla LovelessGoldston, Scott, MD  Referring physician: Wallace CullensGray Reason for consult: Acute pancreatitis, lipase >5000.  Admitted last month with the same.  CT without gas now, there prior.  S/p chole, no ETOH, no meds - ?idiopathic.  Not seen by GI prior, likely needs inpatient.    Assessment and Plan: * Acute on chronic pancreatitis (HCC) -Patient with prior h/o acute idiopathic pancreatitis last month presenting with similar symptoms -Now with frank pancreatitis by H&P, markedly elevated lipase -Will admit to med surg -Strict NPO for now -Aggressive IVF hydration at least for the first 12 hours with LR at 200 cc/hr -Pain control with morphine 2 mg q2h prn.   -Nausea control with Zofran -The 4 most likely causes for pancreatitis include:             -Gallstones - she is s/p cholecystectomy             -Alcohol - does not drink.             -Medications - She does not appear to be taking medications that place her at increased risk other than PPI (will hold)              -Hypertriglyceridemia - Normal TG testing 1 month ago during last hospitalization -If evaluation for all of these is negative, this will again be attributed to idiopathic pancreatitis - which unfortunately also means that there is no way to predict when it might recur. -Will add a C4 complement level to evaluate for autoimmune pancreatitis, which is rare but which responds to steroid therapy. -Given recurrence and inability to f/u with GI between hospitalizations, recommend inpatient GI consult upon arrival to [facility]. -Resume Creon when taking PO  DNR (do not resuscitate) -I have discussed code status with the patient and her daughter and  they are in agreement that the patient would not desire resuscitation and would prefer to die a natural  death should that situation arise. -She will need a gold out of facility DNR form at the time of discharge   Stage 3a chronic kidney disease (CKD) (HCC) -Mild AKI, likely related to n/v and anorexia -Will give IVF -Attempt to avoid nephrotoxic medications -Recheck BMP in AM   Atrial fibrillation (HCC) -Rate controlled with Diltiazem -Has pacemaker -Continue Eliquis  HTN (hypertension) -Continue Diltiazem      HPI: Danielle ChimesDorothy F Lusby is a 88 y.o. female with past medical history of afib, HTN, HLD, and pancreatitis presenting with abdominal pain.  She stayed up late and started to lie down and noticed abdominal pain.  She has constipation, tried to have a Bm and could not.  She was walking up and down the hall.  She drank some prune juice, took some Tylenol.  It was still hurting.  Pain was across the top of her abdomen.  She was last admitted from 10/11-13 with acute idiopathic pancreatitis.  Last time, she ate fried fish.  This time, she ate grilled salmon, mashed potatoes, drank water.  They were out of town over the weekend, ate some sweets but nothing fried/greasy.  She was last admitted from 10/11-13 for pancreatitis.  She was scheduled for outpatient GI f/u.  Also with transaminitis that improved during the hospitalization.    Review of Systems: As mentioned in the history of present illness. All other systems  reviewed and are negative. Past Medical History:  Diagnosis Date   Atrial fibrillation (HCC)    Bell's palsy 2009;    on the right   Brain TIA    pt denies this hx on 07/18/2013; "they thought it was a stroke but dr said later that it was Bell's Palsy"   Cerebrovascular disease    GERD (gastroesophageal reflux disease)    Hepatitis    "don't remember what kind; had it ~ 3 times when I was younger; last time was in the 1990's"   Hyperlipidemia    Hypertension    Pancreatitis    Past Surgical History:  Procedure Laterality Date   APPENDECTOMY  ~ 1971   CATARACT  EXTRACTION Right    CHOLECYSTECTOMY  ~ 1971   PACEMAKER IMPLANT N/A 09/27/2019   Procedure: PACEMAKER IMPLANT;  Surgeon: Duke Salvia, MD;  Location: [facility] INVASIVE CV LAB;  Service: Cardiovascular;  Laterality: N/A;   TONSILLECTOMY  1949   Social History:  reports that she has never smoked. She has never used smokeless tobacco. She reports that she does not drink alcohol and does not use drugs.  Allergies  Allergen Reactions   Carafate [Sucralfate] Other (See Comments)    Rigor   Tessalon Perles [Benzonatate] Other (See Comments)    Unknown reaction   Neurontin [Gabapentin] Other (See Comments)    Unknown reaction    Zantac [Ranitidine Hcl] Other (See Comments)    GI upset    Family History  Problem Relation Age of Onset   Pneumonia Mother        old age   Heart attack Father    Diabetes Father    Diabetes Sister    Diabetes Brother    Cancer Sister    Diabetes Sister    Diabetes Sister     Prior to Admission medications   Medication Sig Start Date End Date Taking? Authorizing Provider  acetaminophen (TYLENOL) 500 MG tablet Take 1,000 mg by mouth daily as needed for mild pain, fever or headache.    [provider]  Artificial Tear Ointment (DRY EYES OP) Place 1 drop into the right eye 4 (four) times daily as needed (dry eyes).    [provider]  CREON 24000-76000 units CPEP Take 1 capsule by mouth with breakfast, with lunch, and with evening meal. 04/23/20   [provider]  diltiazem (CARDIZEM CD) 180 MG 24 hr capsule Take 1 capsule (180 mg total) by mouth 2 (two) times daily. 11/24/21   Duke Salvia, MD  docusate sodium (COLACE) 100 MG capsule Take 1 capsule (100 mg total) by mouth 2 (two) times daily as needed for mild constipation. 10/29/21 10/29/22  [place], Eric J, DO  ELIQUIS 2.5 MG TABS tablet Take 2.5 mg by mouth 2 (two) times daily. 05/19/20   [provider]  pantoprazole (PROTONIX) 40 MG tablet Take 40 mg by mouth daily. 05/01/20    [provider]  losartan (COZAAR) 50 MG tablet Take 1 tablet (50 mg total) by mouth daily. 07/24/19 10/11/19  Glade Lloyd, MD    Physical Exam: Vitals:   11/29/21 1530 11/29/21 1547 11/29/21 1547 11/29/21 1630  BP: 131/69  (!) 144/71 (!) 144/72  Pulse:   60 60  Resp: 18  19 14   Temp:  97.8 F (36.6 C)    TempSrc:  Oral    SpO2:   98% 97%  Weight:       General:  Appears calm and comfortable and is  in NAD Eyes:  PERRL, EOMI, normal lids, iris ENT:  grossly normal hearing, lips & tongue, mmm; mostly absent dentition Neck:  no LAD, masses or thyromegaly Cardiovascular:  RRR, no m/r/g. No LE edema.  Respiratory:   CTA bilaterally with no wheezes/rales/rhonchi.  Normal respiratory effort. Abdomen:  soft, TTP across upper abdomen, ND Skin:  no rash or induration seen on limited exam Musculoskeletal:  grossly normal tone BUE/BLE, good ROM, no bony abnormality Psychiatric:  grossly normal mood and affect, speech fluent and appropriate, AOx3 Neurologic:  CN 2-12 grossly intact, moves all extremities in coordinated fashion   Radiological Exams on Admission: Independently reviewed - see discussion in A/P where applicable  CT ABDOMEN PELVIS W CONTRAST  Result Date: 11/29/2021 CLINICAL DATA:  88 year old female with history of epigastric pain. Recent hospital admission for pancreatitis. Constipation, nausea and vomiting. EXAM: CT ABDOMEN AND PELVIS WITH CONTRAST TECHNIQUE: Multidetector CT imaging of the abdomen and pelvis was performed using the standard protocol following bolus administration of intravenous contrast. RADIATION DOSE REDUCTION: This exam was performed according to the departmental dose-optimization program which includes automated exposure control, adjustment of the mA and/or kV according to patient size and/or use of iterative reconstruction technique. CONTRAST:  67mL OMNIPAQUE IOHEXOL 300 MG/ML  SOLN COMPARISON:  CT the abdomen and pelvis 10/27/2021. FINDINGS:  Lower chest: Pacemaker lead terminating in the right ventricle. Moderate to large hiatal hernia. Small right pleural effusion lying dependently. Passive areas of subsegmental atelectasis and/or scarring are noted in the lower lobes of the lungs bilaterally. Atherosclerotic calcifications in the distal descending thoracic aorta. Hepatobiliary: No definite suspicious cystic or solid hepatic lesions are confidently identified. Diffuse periportal edema is noted, nonspecific. No intra or extrahepatic biliary ductal dilatation. Common bile duct measures 4 mm in the porta hepatis. Status post cholecystectomy. Pancreas: Inflammatory changes are again noted surrounding the pancreas, most evident adjacent to the pancreatic head, uncinate process and proximal pancreatic body where there is fluid and fat stranding surrounding the pancreas and adjacent structures, including the second and third portions of the duodenum. Pancreatic parenchyma enhances normally. No pancreatic ductal dilatation. Spleen: Unremarkable. Adrenals/Urinary Tract: 11 mm low-attenuation nonenhancing lesion in the anterior aspect of the upper pole of the left kidney is compatible with a small simple cyst (Bosniak class 1, no imaging follow-up recommended). Right kidney and bilateral adrenal glands are normal in appearance. No hydroureteronephrosis. Urinary bladder is normal in appearance. Stomach/Bowel: The appearance of the stomach is normal. No pathologic dilatation of small bowel or colon. Extensive inflammatory changes and small volume of non organized fluid noted adjacent to the second and third portions of the duodenum. The appendix is not confidently identified and may be surgically absent. Regardless, there are no inflammatory changes noted adjacent to the cecum to suggest the presence of an acute appendicitis at this time. Vascular/Lymphatic: Atherosclerosis in the abdominal aorta and pelvic vasculature, without evidence of aneurysm or dissection.  Superior mesenteric vein, splenoportal confluence, splenic vein and portal vein all remain patent at this time. No definite lymphadenopathy noted in the abdomen or pelvis. Reproductive: Uterus and ovaries are atrophic but otherwise unremarkable in appearance. Other: Inflammatory changes and small amount of fluid adjacent to the head, uncinate process and proximal body of the pancreas, extending into adjacent portions of the retroperitoneum. No well organized fluid collection to clearly indicate the presence of a pseudocyst at this time. No significant volume of ascites. No pneumoperitoneum. Musculoskeletal: There are no aggressive appearing lytic or blastic lesions noted in the  visualized portions of the skeleton. IMPRESSION: 1. Imaging findings of acute pancreatitis redemonstrated, without substantial change compared to the recent prior study, as above. No well organized pancreatic pseudocyst identified at this time. 2. Periportal edema in the liver. This is a nonspecific finding, but correlation with liver function tests is recommended. 3. Small right pleural effusion lying dependently. 4. Aortic atherosclerosis. 5. Moderate to large hiatal hernia. 6. Additional incidental findings, as above. Electronically Signed   By: Trudie Reed M.D.   On: 11/29/2021 06:33    EKG: Independently reviewed.  NSR with rate 80; LAFB; no evidence of acute ischemia   Labs on Admission: I have personally reviewed the available labs and imaging studies at the time of the admission.  Pertinent labs:    Glucose 175 BUN 15/Creatinine 1.18/GFR 44; 12/0.79/>60 on 10/13 Lipase 5772 HS troponin 10 WBC 10.9 A1c 5.7 on 10/11   Family Communication: Daughter was present throughout evaluation Primary team communication: Patient was seen in person at the [facility]   Thank you very much for involving [place] in the care of your patient.  Author: Jonah Blue, MD 11/29/2021 6:33 PM  For on call review [email or web address].

## 2021-11-29 NOTE — ED Notes (Signed)
Consulted with Pharm related to Cardizem not being approve. Pharm stated that we did not have the amount ordered in the mar and that Pt was stable to hold off for this time. Pt's Hr has been WDL

## 2021-11-29 NOTE — H&P (Addendum)
History and Physical    Patient: Danielle Colon WEX:[id number] DOB: 06/01/1930 DOA: 11/29/2021 DOS: the patient was seen and examined on 11/29/2021 PCP: Daisy Floro, MD  Patient coming from: Home  Chief Complaint:  Chief Complaint  Patient presents with   Abdominal Pain   HPI: [name] is a 88 y.o. female with medical history significant of afib, HTN, HLD, and pancreatitis presenting with abdominal pain.  She stayed up late and started to lie down and noticed abdominal pain.  She has constipation, tried to have a Bm and could not.  She was walking up and down the hall.  She drank some prune juice, took some Tylenol.  It was still hurting.  Pain was across the top of her abdomen.  She was last admitted from 10/11-13 with acute idiopathic pancreatitis.  Last time, she ate fried fish.  This time, she ate grilled salmon, mashed potatoes, drank water.  They were out of town over the weekend, ate some sweets but nothing fried/greasy.   She was last admitted from 10/11-13 for pancreatitis.  She was scheduled for outpatient GI f/u.  Also with transaminitis that improved during the hospitalization.   Review of Systems: As mentioned in the history of present illness. All other systems reviewed and are negative. Past Medical History:  Diagnosis Date   Atrial fibrillation (HCC)    Bell's palsy 2009;    on the right   Brain TIA    pt denies this hx on 07/18/2013; "they thought it was a stroke but dr said later that it was Bell's Palsy"   Cerebrovascular disease    GERD (gastroesophageal reflux disease)    Hepatitis    "don't remember what kind; had it ~ 3 times when I was younger; last time was in the 1990's"   Hyperlipidemia    Hypertension    Pancreatitis    Past Surgical History:  Procedure Laterality Date   APPENDECTOMY  ~ 1971   CATARACT EXTRACTION Right    CHOLECYSTECTOMY  ~ 1971   PACEMAKER IMPLANT N/A 09/27/2019   Procedure: PACEMAKER IMPLANT;  Surgeon: Duke Salvia, MD;  Location: [facility] INVASIVE CV LAB;  Service: Cardiovascular;  Laterality: N/A;   TONSILLECTOMY  1949   Social History:  reports that she has never smoked. She has never used smokeless tobacco. She reports that she does not drink alcohol and does not use drugs.  Allergies  Allergen Reactions   Carafate [Sucralfate] Other (See Comments)    Rigor   Tessalon Perles [Benzonatate] Other (See Comments)    Unknown reaction   Neurontin [Gabapentin] Other (See Comments)    Unknown reaction    Zantac [Ranitidine Hcl] Other (See Comments)    GI upset    Family History  Problem Relation Age of Onset   Pneumonia Mother        old age   Heart attack Father    Diabetes Father    Diabetes Sister    Diabetes Brother    Cancer Sister    Diabetes Sister    Diabetes Sister     Prior to Admission medications   Medication Sig Start Date End Date Taking? Authorizing Provider  acetaminophen (TYLENOL) 500 MG tablet Take 1,000 mg by mouth daily as needed for mild pain, fever or headache.    [provider]  Artificial Tear Ointment (DRY EYES OP) Place 1 drop into the right eye 4 (four) times daily as needed (dry eyes).    [provider]  CREON 24000-76000 units CPEP Take 1 capsule by mouth with breakfast, with lunch, and with evening meal. 04/23/20   [provider]  diltiazem (CARDIZEM CD) 180 MG 24 hr capsule Take 1 capsule (180 mg total) by mouth 2 (two) times daily. 11/24/21   Duke SalviaKlein, Steven C, MD  docusate sodium (COLACE) 100 MG capsule Take 1 capsule (100 mg total) by mouth 2 (two) times daily as needed for mild constipation. 10/29/21 10/29/22  [place], Eric J, DO  ELIQUIS 2.5 MG TABS tablet Take 2.5 mg by mouth 2 (two) times daily. 05/19/20   [provider]  pantoprazole (PROTONIX) 40 MG tablet Take 40 mg by mouth daily. 05/01/20   [provider]  losartan (COZAAR) 50 MG tablet Take 1 tablet (50 mg total) by mouth daily. 07/24/19 10/11/19  Glade LloydAlekh, Kshitiz,  MD    Physical Exam: Vitals:   11/29/21 1630 11/29/21 1830 11/29/21 1930 11/29/21 2029  BP: (!) 144/72 (!) 143/72 (!) 140/70 (!) 147/77  Pulse: 60  (!) 58 62  Resp: 14 19 (!) 22 (!) 22  Temp:    97.6 F (36.4 C)  TempSrc:    Axillary  SpO2: 97%  97% 98%  Weight:    72.9 kg   Constitutional: NAD, calm, comfortable Eyes: PERRL, lids and conjunctivae normal ENMT: Mucous membranes are dry. Posterior pharynx clear of any exudate or lesions.Normal dentition.  Neck: normal, supple, no masses, no thyromegaly Respiratory: clear to auscultation bilaterally, no wheezing, no crackles. Normal respiratory effort. No accessory muscle use.  Cardiovascular: Regular rate and rhythm, no murmurs / rubs / gallops. No extremity edema. 2+ pedal pulses. No carotid bruits.  Abdomen: epigastric TTP Musculoskeletal: no clubbing / cyanosis. No joint deformity upper and lower extremities. Good ROM, no contractures. Normal muscle tone.  Skin: no rashes, lesions, ulcers. No induration Neurologic: CN 2-12 grossly intact. Sensation intact, DTR normal. Strength 5/5 in all 4.  Psychiatric: Normal judgment and insight. Alert and oriented x 3. Normal mood.   Data Reviewed:       Latest Ref Rng & Units 11/29/2021    4:42 AM 10/29/2021    4:48 AM 10/28/2021    5:01 AM  CBC  WBC 4.0 - 10.5 K/uL 10.9  9.6  9.3   Hemoglobin 12.0 - 15.0 g/dL 16.113.1  09.612.7  04.512.2   Hematocrit 36.0 - 46.0 % 39.2  37.6  36.2   Platelets 150 - 400 K/uL 183  173  165       Latest Ref Rng & Units 11/29/2021    4:42 AM 10/29/2021    4:48 AM 10/28/2021    5:01 AM  CMP  Glucose 70 - 99 mg/dL 409175  811104  81   BUN 8 - 23 mg/dL 15  12  10    Creatinine 0.44 - 1.00 mg/dL 9.141.18  7.820.79  9.560.86   Sodium 135 - 145 mmol/L 139  140  135   Potassium 3.5 - 5.1 mmol/L 3.7  3.7  3.7   Chloride 98 - 111 mmol/L 102  109  106   CO2 22 - 32 mmol/L 26  24  22    Calcium 8.9 - 10.3 mg/dL 21.310.1  8.8  8.6   Total Protein 6.5 - 8.1 g/dL 7.5  6.0  5.8   Total  Bilirubin 0.3 - 1.2 mg/dL 0.5  0.9  1.7   Alkaline Phos 38 - 126 U/L 99  134  142   AST 15 - 41 U/L 17  45  125   ALT 0 - 44 U/L 15  87  146    Lipase 5772  CT AP: IMPRESSION: 1. Imaging findings of acute pancreatitis redemonstrated, without substantial change compared to the recent prior study, as above. No well organized pancreatic pseudocyst identified at this time. 2. Periportal edema in the liver. This is a nonspecific finding, but correlation with liver function tests is recommended. 3. Small right pleural effusion lying dependently. 4. Aortic atherosclerosis. 5. Moderate to large hiatal hernia. 6. Additional incidental findings, as above.  Assessment and Plan: * Acute on chronic pancreatitis (HCC) -Patient with prior h/o acute idiopathic pancreatitis last month presenting with similar symptoms -Now with frank pancreatitis by H&P, markedly elevated lipase -Will admit to med surg -Strict NPO for now -Aggressive IVF hydration at least for the first 12 hours with LR at 200 cc/hr -500 uop over day, pt positive 1.2L, will back IVF down just a bit to 150 cc/hr overnight; day team to re-assess volume status in AM -Pain control with morphine 2 mg q2h prn.   -Nausea control with Zofran -The 4 most likely causes for pancreatitis include:             -Gallstones - she is s/p cholecystectomy,  did have LFT elevations during last admit but no duct dilation on imaging.  This time LFTs are stone cold normal despite a lipase of 5772              -Alcohol - does not drink.             -Medications - She does not appear to be taking medications that place her at increased risk other than PPI (will hold)              -Hypertriglyceridemia - Normal TG testing 1 month ago during last hospitalization, ordering repeat triglycerides -If evaluation for all of these is negative, this will again be attributed to idiopathic pancreatitis - which unfortunately also means that there is no way to predict when  it might recur. -Will add a C4 complement level to evaluate for autoimmune pancreatitis, which is rare but which responds to steroid therapy. -Given recurrence and inability to f/u with GI between hospitalizations, putting in for AM GI consult. -Resume Creon when taking PO -Adding Q4H CBG checks as BGL 175 today -Has PPM, appears to be MRI conditional model so could consider MRCP to look at duct, but I'm not sure if they will be able to see anything on MRI that close to pacer?  Would also need to go over to [facility] for [facility] so ill hold off on ordering for now until you guys can weigh in.  Stage 3a chronic kidney disease (CKD) (HCC) -Mild AKI, likely related to n/v and anorexia -Will give IVF -Attempt to avoid nephrotoxic medications -Recheck BMP in AM -Strict intake and output  DNR (do not resuscitate) -I have discussed code status with the patient and her daughter and  they are in agreement that the patient would not desire resuscitation and would prefer to die a natural death should that situation arise. -She will need a gold out of facility DNR form at the time of discharge   Atrial fibrillation (HCC) -Rate controlled with Diltiazem -Has pacemaker -Continue Eliquis  HTN (hypertension) -Continue Diltiazem      Advance Care Planning:   Code Status: DNR  Consults: Message sent to [facility] GI for IP consult  Family Communication: No family in room  Severity of Illness:  The appropriate patient status for this patient is INPATIENT. Inpatient status is judged to be reasonable and necessary in order to provide the required intensity of service to ensure the patient's safety. The patient's presenting symptoms, physical exam findings, and initial radiographic and laboratory data in the context of their chronic comorbidities is felt to place them at high risk for further clinical deterioration. Furthermore, it is not anticipated that the patient will be medically stable for discharge from the  hospital within 2 midnights of admission.   * I certify that at the point of admission it is my clinical judgment that the patient will require inpatient hospital care spanning beyond 2 midnights from the point of admission due to high intensity of service, high risk for further deterioration and high frequency of surveillance required.*  Author: Hillary Bow., DO 11/29/2021 8:54 PM  For on call review [email or web address].

## 2021-11-29 NOTE — ED Provider Notes (Signed)
[facility] EMERGENCY DEPT Provider Note   CSN: [id number] Arrival date & time: 11/29/21  0428     History  Chief Complaint  Patient presents with   Abdominal Pain    Danielle Colon is a 88 y.o. female.  Patient is an 88 year old female presenting for abdominal pain.  Patient admits to severe, 10/10, epigastric abdominal pain, nonradiating, that started approximately 3 hours prior to arrival that woke her from sleep.  Patient admits to nausea without vomiting.  Denies any fevers or chills.  Denies any diarrhea.  Chart review demonstrates patient was recently discharged on 10/13, approximately 1 month ago, after 2-day stay for acute social pancreatitis with small locules of gas.  Patient was recommended for close follow-up with GI specialist.  She has not seen GI yet.  Denies any alcohol use.  History of cholecystectomy.  The history is provided by the patient. No language interpreter was used.  Abdominal Pain Associated symptoms: nausea   Associated symptoms: no chest pain, no chills, no cough, no dysuria, no fever, no hematuria, no shortness of breath, no sore throat and no vomiting        Home Medications Prior to Admission medications   Medication Sig Start Date End Date Taking? Authorizing Provider  acetaminophen (TYLENOL) 500 MG tablet Take 1,000 mg by mouth daily as needed for mild pain, fever or headache.    [provider]  Artificial Tear Ointment (DRY EYES OP) Place 1 drop into the right eye 4 (four) times daily as needed (dry eyes).    [provider]  CREON 24000-76000 units CPEP Take 1 capsule by mouth with breakfast, with lunch, and with evening meal. 04/23/20   [provider]  diltiazem (CARDIZEM CD) 180 MG 24 hr capsule Take 1 capsule (180 mg total) by mouth 2 (two) times daily. 11/24/21   Duke Salvia, MD  docusate sodium (COLACE) 100 MG capsule Take 1 capsule (100 mg total) by mouth 2 (two) times daily as needed for mild  constipation. 10/29/21 10/29/22  [place], Eric J, DO  ELIQUIS 2.5 MG TABS tablet Take 2.5 mg by mouth 2 (two) times daily. 05/19/20   [provider]  pantoprazole (PROTONIX) 40 MG tablet Take 40 mg by mouth daily. 05/01/20   [provider]  losartan (COZAAR) 50 MG tablet Take 1 tablet (50 mg total) by mouth daily. 07/24/19 10/11/19  Glade Lloyd, MD      Allergies    Carafate [sucralfate], Tessalon perles [benzonatate], Neurontin [gabapentin], and Zantac [ranitidine hcl]    Review of Systems   Review of Systems  Constitutional:  Negative for chills and fever.  HENT:  Negative for ear pain and sore throat.   Eyes:  Negative for pain and visual disturbance.  Respiratory:  Negative for cough and shortness of breath.   Cardiovascular:  Negative for chest pain and palpitations.  Gastrointestinal:  Positive for abdominal pain and nausea. Negative for vomiting.  Genitourinary:  Negative for dysuria and hematuria.  Musculoskeletal:  Negative for arthralgias and back pain.  Skin:  Negative for color change and rash.  Neurological:  Negative for seizures and syncope.  All other systems reviewed and are negative.   Physical Exam Updated Vital Signs BP 127/63   Pulse 60   Temp 98.5 F (36.9 C) (Oral)   Resp 19   Wt 73 kg   SpO2 95%   BMI 29.43 kg/m  Physical Exam Vitals and nursing note reviewed.  Constitutional:  General: She is not in acute distress.    Appearance: She is well-developed.  HENT:     Head: Normocephalic and atraumatic.  Eyes:     Conjunctiva/sclera: Conjunctivae normal.  Cardiovascular:     Rate and Rhythm: Normal rate and regular rhythm.     Heart sounds: No murmur heard. Pulmonary:     Effort: Pulmonary effort is normal. No respiratory distress.     Breath sounds: Normal breath sounds.  Abdominal:     Palpations: Abdomen is soft.     Tenderness: There is abdominal tenderness in the epigastric area. There is no guarding or rebound.   Musculoskeletal:        General: No swelling.     Cervical back: Neck supple.  Skin:    General: Skin is warm and dry.     Capillary Refill: Capillary refill takes less than 2 seconds.  Neurological:     Mental Status: She is alert.  Psychiatric:        Mood and Affect: Mood normal.     ED Results / Procedures / Treatments   Labs (all labs ordered are listed, but only abnormal results are displayed) Labs Reviewed  LIPASE, BLOOD - Abnormal; Notable for the following components:      Result Value   Lipase 5,772 (*)    All other components within normal limits  COMPREHENSIVE METABOLIC PANEL - Abnormal; Notable for the following components:   Glucose, Bld 175 (*)    Creatinine, Ser 1.18 (*)    GFR, Estimated 44 (*)    All other components within normal limits  CBC - Abnormal; Notable for the following components:   WBC 10.9 (*)    All other components within normal limits  URINALYSIS, ROUTINE W REFLEX MICROSCOPIC  TROPONIN I (HIGH SENSITIVITY)    EKG EKG Interpretation  Date/Time:  Monday November 29 2021 04:43:37 EST Ventricular Rate:  80 PR Interval:  165 QRS Duration: 81 QT Interval:  381 QTC Calculation: 440 R Axis:   -56 Text Interpretation: Ectopic atrial rhythm Left anterior fascicular block Low voltage, precordial leads Confirmed by Edwin Dada ([id number]) on 11/29/2021 4:52:25 AM  Radiology CT ABDOMEN PELVIS W CONTRAST  Result Date: 11/29/2021 CLINICAL DATA:  88 year old female with history of epigastric pain. Recent hospital admission for pancreatitis. Constipation, nausea and vomiting. EXAM: CT ABDOMEN AND PELVIS WITH CONTRAST TECHNIQUE: Multidetector CT imaging of the abdomen and pelvis was performed using the standard protocol following bolus administration of intravenous contrast. RADIATION DOSE REDUCTION: This exam was performed according to the departmental dose-optimization program which includes automated exposure control, adjustment of the mA and/or kV  according to patient size and/or use of iterative reconstruction technique. CONTRAST:  18mL OMNIPAQUE IOHEXOL 300 MG/ML  SOLN COMPARISON:  CT the abdomen and pelvis 10/27/2021. FINDINGS: Lower chest: Pacemaker lead terminating in the right ventricle. Moderate to large hiatal hernia. Small right pleural effusion lying dependently. Passive areas of subsegmental atelectasis and/or scarring are noted in the lower lobes of the lungs bilaterally. Atherosclerotic calcifications in the distal descending thoracic aorta. Hepatobiliary: No definite suspicious cystic or solid hepatic lesions are confidently identified. Diffuse periportal edema is noted, nonspecific. No intra or extrahepatic biliary ductal dilatation. Common bile duct measures 4 mm in the porta hepatis. Status post cholecystectomy. Pancreas: Inflammatory changes are again noted surrounding the pancreas, most evident adjacent to the pancreatic head, uncinate process and proximal pancreatic body where there is fluid and fat stranding surrounding the pancreas and adjacent structures, including the second  and third portions of the duodenum. Pancreatic parenchyma enhances normally. No pancreatic ductal dilatation. Spleen: Unremarkable. Adrenals/Urinary Tract: 11 mm low-attenuation nonenhancing lesion in the anterior aspect of the upper pole of the left kidney is compatible with a small simple cyst (Bosniak class 1, no imaging follow-up recommended). Right kidney and bilateral adrenal glands are normal in appearance. No hydroureteronephrosis. Urinary bladder is normal in appearance. Stomach/Bowel: The appearance of the stomach is normal. No pathologic dilatation of small bowel or colon. Extensive inflammatory changes and small volume of non organized fluid noted adjacent to the second and third portions of the duodenum. The appendix is not confidently identified and may be surgically absent. Regardless, there are no inflammatory changes noted adjacent to the cecum to  suggest the presence of an acute appendicitis at this time. Vascular/Lymphatic: Atherosclerosis in the abdominal aorta and pelvic vasculature, without evidence of aneurysm or dissection. Superior mesenteric vein, splenoportal confluence, splenic vein and portal vein all remain patent at this time. No definite lymphadenopathy noted in the abdomen or pelvis. Reproductive: Uterus and ovaries are atrophic but otherwise unremarkable in appearance. Other: Inflammatory changes and small amount of fluid adjacent to the head, uncinate process and proximal body of the pancreas, extending into adjacent portions of the retroperitoneum. No well organized fluid collection to clearly indicate the presence of a pseudocyst at this time. No significant volume of ascites. No pneumoperitoneum. Musculoskeletal: There are no aggressive appearing lytic or blastic lesions noted in the visualized portions of the skeleton. IMPRESSION: 1. Imaging findings of acute pancreatitis redemonstrated, without substantial change compared to the recent prior study, as above. No well organized pancreatic pseudocyst identified at this time. 2. Periportal edema in the liver. This is a nonspecific finding, but correlation with liver function tests is recommended. 3. Small right pleural effusion lying dependently. 4. Aortic atherosclerosis. 5. Moderate to large hiatal hernia. 6. Additional incidental findings, as above. Electronically Signed   By: Trudie Reed M.D.   On: 11/29/2021 06:33    Procedures Procedures    Medications Ordered in ED Medications  0.9 %  sodium chloride infusion (has no administration in time range)  sodium chloride 0.9 % bolus 1,000 mL (1,000 mLs Intravenous New Bag/Given 11/29/21 0509)  ondansetron (ZOFRAN) injection 4 mg (4 mg Intravenous Given 11/29/21 0505)  ketorolac (TORADOL) 15 MG/ML injection 15 mg (15 mg Intravenous Given 11/29/21 0505)  morphine (PF) 4 MG/ML injection 4 mg (4 mg Intravenous Given 11/29/21  0505)  HYDROmorphone (DILAUDID) injection 0.5 mg (0.5 mg Intravenous Given 11/29/21 0542)  iohexol (OMNIPAQUE) 300 MG/ML solution 100 mL (75 mLs Intravenous Contrast Given 11/29/21 0558)    ED Course/ Medical Decision Making/ A&P                           Medical Decision Making Amount and/or Complexity of Data Reviewed Labs: ordered. Radiology: ordered.  Risk Prescription drug management. Decision regarding hospitalization.   15:39 AM 88 year old female presenting for gastric abdominal pain.  Patient is alert and oriented x3, no acute distress, afebrile, stable vital signs.  Physical exam demonstrates soft abdomen with tenderness to palpation of the epigastric region only.  No guarding or rigidity.  Differential diagnosis includes but is not limited to pancreatitis, ACS, volvulus, bowel obstruction, etc.  EKG is interpreted by myself demonstrates no ST segment elevation or depression.  Morphine given with no improve of pain.  Dilaudid ordered.  7:16 AM Laboratory studies demonstrate a lipase above 5000 and  CT study concerning for acute pancreatitis again.  On reevaluation patient admits to improvement of pain after Dilaudid.  Patient is already n.p.o. and has received 1 L of IV fluids.  We will continue maintenance fluids at this time.  Recommend admission for pain control.  Patient accepted by Dr. Ophelia CharterYates.         Final Clinical Impression(s) / ED Diagnoses Final diagnoses:  AKI (acute kidney injury) (HCC)  Acute pancreatitis, unspecified complication status, unspecified pancreatitis type    Rx / DC Orders ED Discharge Orders     None         Franne FortsGray, Shaylen Nephew P, DO 11/29/21 [phone]

## 2021-11-29 NOTE — ED Notes (Signed)
Pt denis pain. Pt states that she is good with just water and does not want anything to eat; pt is afraid to eat anything at this time

## 2021-11-29 NOTE — Assessment & Plan Note (Addendum)
-  Mild AKI, likely related to n/v and anorexia -Will give IVF -Attempt to avoid nephrotoxic medications -Recheck BMP in AM -Strict intake and output

## 2021-11-29 NOTE — Assessment & Plan Note (Addendum)
-  Patient with prior h/o acute idiopathic pancreatitis last month presenting with similar symptoms -Now with frank pancreatitis by H&P, markedly elevated lipase -Will admit to med surg -Strict NPO for now -Aggressive IVF hydration at least for the first 12 hours with LR at 200 cc/hr -500 uop over day, pt positive 1.2L, will back IVF down just a bit to 150 cc/hr overnight; day team to re-assess volume status in AM -Pain control with morphine 2 mg q2h prn.   -Nausea control with Zofran -The 4 most likely causes for pancreatitis include:             -Gallstones - she is s/p cholecystectomy,  did have LFT elevations during last admit but no duct dilation on imaging.  This time LFTs are stone cold normal despite a lipase of 5772              -Alcohol - does not drink.             -Medications - She does not appear to be taking medications that place her at increased risk other than PPI (will hold)              -Hypertriglyceridemia - Normal TG testing 1 month ago during last hospitalization, ordering repeat triglycerides -If evaluation for all of these is negative, this will again be attributed to idiopathic pancreatitis - which unfortunately also means that there is no way to predict when it might recur. -Will add a C4 complement level to evaluate for autoimmune pancreatitis, which is rare but which responds to steroid therapy. -Given recurrence and inability to f/u with GI between hospitalizations, putting in for AM GI consult. -Resume Creon when taking PO -Adding Q4H CBG checks as BGL 175 today -Has PPM, appears to be MRI conditional model so could consider MRCP to look at duct, but I'm not sure if they will be able to see anything on MRI that close to pacer?  Would also need to go over to [facility] for MRI so ill hold off on ordering for now until you guys can weigh in.

## 2021-11-29 NOTE — Assessment & Plan Note (Signed)
-  I have discussed code status with the patient and her daughter and  they are in agreement that the patient would not desire resuscitation and would prefer to die a natural death should that situation arise. ?-She will need a gold out of facility DNR form at the time of discharge ?

## 2021-11-30 ENCOUNTER — Inpatient Hospital Stay (HOSPITAL_COMMUNITY): Payer: PPO

## 2021-11-30 DIAGNOSIS — K859 Acute pancreatitis without necrosis or infection, unspecified: Secondary | ICD-10-CM | POA: Diagnosis not present

## 2021-11-30 DIAGNOSIS — K861 Other chronic pancreatitis: Secondary | ICD-10-CM | POA: Diagnosis not present

## 2021-11-30 LAB — GLUCOSE, CAPILLARY
Glucose-Capillary: 86 mg/dL (ref 70–99)
Glucose-Capillary: 85 mg/dL (ref 70–99)
Glucose-Capillary: 99 mg/dL (ref 70–99)
Glucose-Capillary: 93 mg/dL (ref 70–99)
Glucose-Capillary: 81 mg/dL (ref 70–99)

## 2021-11-30 LAB — COMPREHENSIVE METABOLIC PANEL
ALT: 223 U/L — ABNORMAL HIGH (ref 0–44)
AST: 176 U/L — ABNORMAL HIGH (ref 15–41)
Albumin: 3.3 g/dL — ABNORMAL LOW (ref 3.5–5.0)
Alkaline Phosphatase: 144 U/L — ABNORMAL HIGH (ref 38–126)
Anion gap: 9 (ref 5–15)
BUN: 10 mg/dL (ref 8–23)
CO2: 23 mmol/L (ref 22–32)
Calcium: 9 mg/dL (ref 8.9–10.3)
Chloride: 108 mmol/L (ref 98–111)
Creatinine, Ser: 0.91 mg/dL (ref 0.44–1.00)
GFR, Estimated: >60 mL/min (ref >60)
Glucose, Bld: 88 mg/dL (ref 70–99)
Potassium: 3.9 mmol/L (ref 3.5–5.1)
Sodium: 140 mmol/L (ref 135–145)
Total Bilirubin: 1.3 mg/dL — ABNORMAL HIGH (ref 0.3–1.2)
Total Protein: 6 g/dL — ABNORMAL LOW (ref 6.5–8.1)

## 2021-11-30 LAB — CBC
HCT: 38.1 % (ref 36.0–46.0)
Hemoglobin: 12.6 g/dL (ref 12.0–15.0)
MCH: 30.1 pg (ref 26.0–34.0)
MCHC: 33.1 g/dL (ref 30.0–36.0)
MCV: 91.1 fL (ref 80.0–100.0)
Platelets: 157 10*3/uL (ref 150–400)
RBC: 4.18 MIL/uL (ref 3.87–5.11)
RDW: 13.2 % (ref 11.5–15.5)
WBC: 6.2 10*3/uL (ref 4.0–10.5)
nRBC: 0 % (ref 0.0–0.2)

## 2021-11-30 LAB — LIPASE, BLOOD: Lipase: 293 U/L — ABNORMAL HIGH (ref 11–51)

## 2021-11-30 MED ORDER — METOPROLOL TARTRATE 5 MG/5ML IV SOLN
2.0000 mg | Freq: Once | INTRAVENOUS | Status: AC
  Administered 2021-11-30: 2 mg via INTRAVENOUS
  Filled 2021-11-30: qty 5

## 2021-11-30 NOTE — Progress Notes (Signed)
PROGRESS NOTE    Danielle Colon [facility]  BTD:[id number] DOB: 1930-11-14 DOA: 11/29/2021 PCP: Daisy Floro, MD    Brief Narrative:  88 year old with history of A-fib, hypertension, hyperlipidemia and recently developing pancreatitis, history of cholecystectomy 42 years ago presented with another episode of abdominal pain and nausea.  She was in the hospital 10/11-10/13 with acute idiopathic pancreatitis and she was symptomatically treated and got better.  These episodes she attributes to eating salmon. At the emergency room hemodynamically stable.  Lipase level 5772, AST ALT mildly elevated.  Total bilirubin 1.3.  CT scan abdomen pelvis showed acute pancreatitis without pseudocyst, periportal liver edema.  Large hiatal hernia.  Assessment & Plan:   Acute biliary pancreatitis, recurrent episode: Symptoms with some improvement today, will start on clear liquid diet.  Continue maintenance IV fluids.  Adequate pain medications.  Biochemically improving. Primary cause unknown. Status postcholecystectomy, suspect frequent biliary stone.  MRCP today.  Anticipating ERCP if patient agrees. No alcohol use. Triglycerides normal. Followed by GI.  CKD stage IIIa: At about baseline.  Monitor on treatment.  Atrial fibrillation: Permanent atrial fibrillation.  Rate controlled on diltiazem.  She has a pacemaker.  She is on Eliquis.  May need ERCP, will discontinue Eliquis for time being.   DVT prophylaxis: apixaban (ELIQUIS) tablet 2.5 mg Start: 11/29/21 1000 apixaban (ELIQUIS) tablet 2.5 mg   Code Status: DNR Family Communication: None at the bedside Disposition Plan: Status is: Inpatient Remains inpatient appropriate because: Acute pancreatitis, inpatient procedures anticipated     Consultants:  Gastroenterology  Procedures:  None  Antimicrobials:  None   Subjective: Patient seen in the morning rounds.  She feels dry mouth.  She feels like her lips are swollen.  Mild abdominal pain  persist but denies any need for pain medications.  Denies any nausea or vomiting.  Objective: Vitals:   11/30/21 0027 11/30/21 0300 11/30/21 0423 11/30/21 0726  BP: (!) 143/80 136/82 136/77 128/85  Pulse: 96 (!) 106 (!) 102 82  Resp: 18 20 16 18   Temp: 98 F (36.7 C)  98.9 F (37.2 C) 98 F (36.7 C)  TempSrc: Oral  Oral Oral  SpO2: 96% 97% 100% 99%  Weight:   72.9 kg     Intake/Output Summary (Last 24 hours) at 11/30/2021 1152 Last data filed at 11/30/2021 0700 Gross per 24 hour  Intake 2089.24 ml  Output 1350 ml  Net 739.24 ml   Filed Weights   11/29/21 0440 11/29/21 2029 11/30/21 0423  Weight: 73 kg 72.9 kg 72.9 kg    Examination:  General exam: Appears calm and comfortable  Respiratory system: No added sounds.  On minimal oxygen for support. Cardiovascular system: S1 & S2 heard, pacer in place.   Gastrointestinal system: Soft.  Mildly tender along the epigastrium.  No rigidity or guarding.  Bowel sound present. Central nervous system: Alert and oriented. No focal neurological deficits. Extremities: Symmetric 5 x 5 power. Skin: No rashes, lesions or ulcers Psychiatry: Judgement and insight appear normal. Mood & affect appropriate.     Data Reviewed: I have personally reviewed following labs and imaging studies  CBC: Recent Labs  Lab 11/29/21 0442 11/30/21 0509  WBC 10.9* 6.2  HGB 13.1 12.6  HCT 39.2 38.1  MCV 90.5 91.1  PLT 183 157   Basic Metabolic Panel: Recent Labs  Lab 11/29/21 0442 11/30/21 0509  NA 139 140  K 3.7 3.9  CL 102 108  CO2 26 23  GLUCOSE 175* 88  BUN 15 10  CREATININE 1.18* 0.91  CALCIUM 10.1 9.0   GFR: Estimated Creatinine Clearance: 39.2 mL/min (by C-G formula based on SCr of 0.91 mg/dL). Liver Function Tests: Recent Labs  Lab 11/29/21 0442 11/30/21 0509  AST 17 176*  ALT 15 223*  ALKPHOS 99 144*  BILITOT 0.5 1.3*  PROT 7.5 6.0*  ALBUMIN 4.3 3.3*   Recent Labs  Lab 11/29/21 0442 11/30/21 0509  LIPASE 5,772*  293*   No results for input(s): "AMMONIA" in the last 168 hours. Coagulation Profile: No results for input(s): "INR", "PROTIME" in the last 168 hours. Cardiac Enzymes: No results for input(s): "CKTOTAL", "CKMB", "CKMBINDEX", "TROPONINI" in the last 168 hours. BNP (last 3 results) No results for input(s): "PROBNP" in the last 8760 hours. HbA1C: No results for input(s): "HGBA1C" in the last 72 hours. CBG: Recent Labs  Lab 11/30/21 0417 11/30/21 0729  GLUCAP 86 85   Lipid Profile: Recent Labs    11/29/21 2144  TRIG 64   Thyroid Function Tests: No results for input(s): "TSH", "T4TOTAL", "FREET4", "T3FREE", "THYROIDAB" in the last 72 hours. Anemia Panel: No results for input(s): "VITAMINB12", "FOLATE", "FERRITIN", "TIBC", "IRON", "RETICCTPCT" in the last 72 hours. Sepsis Labs: No results for input(s): "PROCALCITON", "LATICACIDVEN" in the last 168 hours.  No results found for this or any previous visit (from the past 240 hour(s)).       Radiology Studies: CT ABDOMEN PELVIS W CONTRAST  Result Date: 11/29/2021 CLINICAL DATA:  88 year old female with history of epigastric pain. Recent hospital admission for pancreatitis. Constipation, nausea and vomiting. EXAM: CT ABDOMEN AND PELVIS WITH CONTRAST TECHNIQUE: Multidetector CT imaging of the abdomen and pelvis was performed using the standard protocol following bolus administration of intravenous contrast. RADIATION DOSE REDUCTION: This exam was performed according to the departmental dose-optimization program which includes automated exposure control, adjustment of the mA and/or kV according to patient size and/or use of iterative reconstruction technique. CONTRAST:  58mL OMNIPAQUE IOHEXOL 300 MG/ML  SOLN COMPARISON:  CT the abdomen and pelvis 10/27/2021. FINDINGS: Lower chest: Pacemaker lead terminating in the right ventricle. Moderate to large hiatal hernia. Small right pleural effusion lying dependently. Passive areas of subsegmental  atelectasis and/or scarring are noted in the lower lobes of the lungs bilaterally. Atherosclerotic calcifications in the distal descending thoracic aorta. Hepatobiliary: No definite suspicious cystic or solid hepatic lesions are confidently identified. Diffuse periportal edema is noted, nonspecific. No intra or extrahepatic biliary ductal dilatation. Common bile duct measures 4 mm in the porta hepatis. Status post cholecystectomy. Pancreas: Inflammatory changes are again noted surrounding the pancreas, most evident adjacent to the pancreatic head, uncinate process and proximal pancreatic body where there is fluid and fat stranding surrounding the pancreas and adjacent structures, including the second and third portions of the duodenum. Pancreatic parenchyma enhances normally. No pancreatic ductal dilatation. Spleen: Unremarkable. Adrenals/Urinary Tract: 11 mm low-attenuation nonenhancing lesion in the anterior aspect of the upper pole of the left kidney is compatible with a small simple cyst (Bosniak class 1, no imaging follow-up recommended). Right kidney and bilateral adrenal glands are normal in appearance. No hydroureteronephrosis. Urinary bladder is normal in appearance. Stomach/Bowel: The appearance of the stomach is normal. No pathologic dilatation of small bowel or colon. Extensive inflammatory changes and small volume of non organized fluid noted adjacent to the second and third portions of the duodenum. The appendix is not confidently identified and may be surgically absent. Regardless, there are no inflammatory changes noted adjacent to the cecum to suggest the presence of an  acute appendicitis at this time. Vascular/Lymphatic: Atherosclerosis in the abdominal aorta and pelvic vasculature, without evidence of aneurysm or dissection. Superior mesenteric vein, splenoportal confluence, splenic vein and portal vein all remain patent at this time. No definite lymphadenopathy noted in the abdomen or pelvis.  Reproductive: Uterus and ovaries are atrophic but otherwise unremarkable in appearance. Other: Inflammatory changes and small amount of fluid adjacent to the head, uncinate process and proximal body of the pancreas, extending into adjacent portions of the retroperitoneum. No well organized fluid collection to clearly indicate the presence of a pseudocyst at this time. No significant volume of ascites. No pneumoperitoneum. Musculoskeletal: There are no aggressive appearing lytic or blastic lesions noted in the visualized portions of the skeleton. IMPRESSION: 1. Imaging findings of acute pancreatitis redemonstrated, without substantial change compared to the recent prior study, as above. No well organized pancreatic pseudocyst identified at this time. 2. Periportal edema in the liver. This is a nonspecific finding, but correlation with liver function tests is recommended. 3. Small right pleural effusion lying dependently. 4. Aortic atherosclerosis. 5. Moderate to large hiatal hernia. 6. Additional incidental findings, as above. Electronically Signed   By: Trudie Reed M.D.   On: 11/29/2021 06:33        Scheduled Meds:  apixaban  2.5 mg Oral BID   diltiazem  180 mg Oral BID   pantoprazole (PROTONIX) IV  40 mg Intravenous Q24H   Continuous Infusions:  lactated ringers 150 mL/hr at 11/30/21 1013     LOS: 1 day    Time spent: 35 minutes    Dorcas Carrow, MD [facility] Pager [phone]

## 2021-11-30 NOTE — Consult Note (Signed)
Referring Provider: [facility] Primary Care Physician:  Lawerance Cruel, MD Primary Gastroenterologist: Dr. Michail Sermon  Reason for Consultation:  Acute pancreatitis  HPI: Danielle Colon is a 88 y.o. female medical history significant of afib, HTN, HLD, and pancreatitis presenting for evaluation of acute pancreatitis.  Recently admitted for idiopathic acute pancreatitis 10/27/2021.  At that time AST 125, ALT 146, alk phos 142, T. bili 1.7.  Triglycerides 52.  Lipase 4095.  Today she presents for recurrent acute pancreatitis.  Lipase 5772.  AST 176, ALT 223, alk phos 144, T. bili 1.3.  Initial leukocytosis with WBC 10.9.  CT abdomen pelvis with contrast shows acute pancreatitis without pseudocyst.  Periportal edema in the liver.  Moderate to large hiatal hernia.  S/p cholecystectomy 1971.  No history of alcohol use  Patient states she ate at [facility] and had fish and mashed potatoes which then caused her subsequent pain later that night around 2 AM.  Pain was severe and quick onset in nature.  Denies rectal bleeding or melena.  Denies nausea/vomiting.  States pain is improved since being admitted to the hospital.  Has had 5 previous episodes of pancreatitis.  Normal triglycerides.  No history of alcohol use.   Virtual colonoscopy 11/18/2019 done for hematochezia: Suspected polyp in the descending/transverse colon.  Inflamed appearing pancreas, unclear significance.  No obvious colon mass/cancer   Past Medical History:  Diagnosis Date   Atrial fibrillation ([place])    Bell's palsy 2009;    on the right   Brain TIA    pt denies this hx on 07/18/2013; "they thought it was a stroke but dr said later that it was Bell's Palsy"   Cerebrovascular disease    GERD (gastroesophageal reflux disease)    Hepatitis    "don't remember what kind; had it ~ 3 times when I was younger; last time was in the 1990's"   Hyperlipidemia    Hypertension    Pancreatitis     Past Surgical History:  Procedure  Laterality Date   APPENDECTOMY  ~ 1971   CATARACT EXTRACTION Right    CHOLECYSTECTOMY  ~ [place] N/A 09/27/2019   Procedure: PACEMAKER IMPLANT;  Surgeon: Deboraha Sprang, MD;  Location: [facility];  Service: Cardiovascular;  Laterality: N/A;   TONSILLECTOMY  1949    Prior to Admission medications   Medication Sig Start Date End Date Taking? Authorizing Provider  acetaminophen (TYLENOL) 500 MG tablet Take 500 mg by mouth 2 (two) times daily as needed for mild pain.   Yes [provider]  Artificial Tear Ointment (DRY EYES OP) Place 1 application  into both eyes at bedtime.   Yes [provider]  Cholecalciferol (VITAMIN D3) 1000 units CAPS Take 1,000 Units by mouth daily.   Yes [provider]  CREON 24000-76000 units CPEP Take 24,000 Units by mouth with breakfast, with lunch, and with evening meal. 04/23/20  Yes [provider]  diltiazem (CARDIZEM CD) 120 MG 24 hr capsule Take 120 mg by mouth in the morning and at bedtime.   Yes [provider]  docusate sodium (COLACE) 100 MG capsule Take 1 capsule (100 mg total) by mouth 2 (two) times daily as needed for mild constipation. Patient taking differently: Take 100 mg by mouth at bedtime. 10/29/21 10/29/22 Yes [place] Eric J, DO  ELIQUIS 2.5 MG TABS tablet Take 2.5 mg by mouth 2 (two) times daily. 05/19/20  Yes [provider]  pantoprazole (PROTONIX) 40  MG tablet Take 40 mg by mouth daily before breakfast. 05/01/20  Yes [provider]  diltiazem (CARDIZEM CD) 180 MG 24 hr capsule Take 1 capsule (180 mg total) by mouth 2 (two) times daily. Patient not taking: Reported on 11/29/2021 11/24/21   Deboraha Sprang, MD  losartan (COZAAR) 50 MG tablet Take 1 tablet (50 mg total) by mouth daily. 07/24/19 10/11/19  Aline August, MD    Scheduled Meds:  apixaban  2.5 mg Oral BID   diltiazem  180 mg Oral BID   pantoprazole (PROTONIX) IV  40 mg Intravenous Q24H    Continuous Infusions:  lactated ringers 150 mL/hr at 11/30/21 0337   PRN Meds:.acetaminophen **OR** acetaminophen, hydrALAZINE, morphine injection, ondansetron **OR** ondansetron (ZOFRAN) IV, mouth rinse  Allergies as of 11/29/2021 - Review Complete 11/29/2021  Allergen Reaction Noted   Carafate [sucralfate] Other (See Comments) 07/19/2021   Cephalosporins Other (See Comments) 11/29/2021   Tessalon perles [benzonatate] Other (See Comments) 06/26/2013   Neurontin [gabapentin] Other (See Comments) 06/26/2013   Zantac [ranitidine hcl] Other (See Comments) 06/26/2013    Family History  Problem Relation Age of Onset   Pneumonia Mother        old age   Heart attack Father    Diabetes Father    Diabetes Sister    Diabetes Brother    Cancer Sister    Diabetes Sister    Diabetes Sister     Social History   Socioeconomic History   Marital status: Divorced    Spouse name: Not on file   Number of children: 4   Years of education: college-2   Highest education level: Not on file  Occupational History   Occupation: retired   Occupation: Magazine features editor  Tobacco Use   Smoking status: Never   Smokeless tobacco: Never  Vaping Use   Vaping Use: Never used  Substance and Sexual Activity   Alcohol use: No   Drug use: No   Sexual activity: Not on file  Other Topics Concern   Not on file  Social History Narrative   Not on file   Social Determinants of Health   Financial Resource Strain: Not on file  Food Insecurity: No Food Insecurity (11/29/2021)   Hunger Vital Sign    Worried About Running Out of Food in the Last Year: Never true    Ran Out of Food in the Last Year: Never true  Transportation Needs: No Transportation Needs (11/29/2021)   PRAPARE - Hydrologist (Medical): No    Lack of Transportation (Non-Medical): No  Physical Activity: Not on file  Stress: Not on file  Social Connections: Not on file  Intimate Partner Violence: Not At  Risk (11/29/2021)   Humiliation, Afraid, Rape, and Kick questionnaire    Fear of Current or Ex-Partner: No    Emotionally Abused: No    Physically Abused: No    Sexually Abused: No    Review of Systems: Review of Systems  Constitutional:  Negative for chills, fever and weight loss.  HENT:  Negative for hearing loss and tinnitus.   Eyes:  Negative for blurred vision and double vision.  Respiratory:  Negative for cough and hemoptysis.   Cardiovascular:  Negative for chest pain and palpitations.  Gastrointestinal:  Positive for abdominal pain. Negative for blood in stool, constipation, diarrhea, heartburn, melena, nausea and vomiting.  Genitourinary:  Negative for dysuria and urgency.  Musculoskeletal:  Negative for myalgias and neck pain.  Skin:  Negative for itching and rash.  Neurological:  Negative for seizures and loss of consciousness.  Psychiatric/Behavioral:  Negative for depression and suicidal ideas.      Physical Exam:Physical Exam Constitutional:      Appearance: Normal appearance. She is well-developed.  HENT:     Head: Normocephalic and atraumatic.     Nose: Nose normal. No congestion.     Mouth/Throat:     Mouth: Mucous membranes are moist.     Pharynx: Oropharynx is clear.  Eyes:     Extraocular Movements: Extraocular movements intact.     Conjunctiva/sclera: Conjunctivae normal.  Cardiovascular:     Rate and Rhythm: Normal rate and regular rhythm.  Pulmonary:     Effort: Pulmonary effort is normal. No respiratory distress.  Abdominal:     General: Abdomen is flat. Bowel sounds are normal. There is no distension.     Palpations: Abdomen is soft. There is no mass.     Tenderness: There is no abdominal tenderness. There is no guarding or rebound.     Hernia: No hernia is present.  Musculoskeletal:        General: No swelling. Normal range of motion.     Cervical back: Normal range of motion and neck supple.  Skin:    General: Skin is warm and dry.   Neurological:     General: No focal deficit present.     Mental Status: She is alert and oriented to person, place, and time.  Psychiatric:        Mood and Affect: Mood normal.        Behavior: Behavior normal.        Thought Content: Thought content normal.        Judgment: Judgment normal.     Vital signs: Vitals:   11/30/21 0423 11/30/21 0726  BP: 136/77 128/85  Pulse: (!) 102 82  Resp: 16 18  Temp: 98.9 F (37.2 C) 98 F (36.7 C)  SpO2: 100% 99%   Last BM Date : 11/29/21    GI:  Lab Results: Recent Labs    11/29/21 0442 11/30/21 0509  WBC 10.9* 6.2  HGB 13.1 12.6  HCT 39.2 38.1  PLT 183 157   BMET Recent Labs    11/29/21 0442 11/30/21 0509  NA 139 140  K 3.7 3.9  CL 102 108  CO2 26 23  GLUCOSE 175* 88  BUN 15 10  CREATININE 1.18* 0.91  CALCIUM 10.1 9.0   LFT Recent Labs    11/30/21 0509  PROT 6.0*  ALBUMIN 3.3*  AST 176*  ALT 223*  ALKPHOS 144*  BILITOT 1.3*   PT/INR No results for input(s): "LABPROT", "INR" in the last 72 hours.   Studies/Results: CT ABDOMEN PELVIS W CONTRAST  Result Date: 11/29/2021 CLINICAL DATA:  88 year old female with history of epigastric pain. Recent hospital admission for pancreatitis. Constipation, nausea and vomiting. EXAM: CT ABDOMEN AND PELVIS WITH CONTRAST TECHNIQUE: Multidetector CT imaging of the abdomen and pelvis was performed using the standard protocol following bolus administration of intravenous contrast. RADIATION DOSE REDUCTION: This exam was performed according to the departmental dose-optimization program which includes automated exposure control, adjustment of the mA and/or kV according to patient size and/or use of iterative reconstruction technique. CONTRAST:  9m OMNIPAQUE IOHEXOL 300 MG/ML  SOLN COMPARISON:  CT the abdomen and pelvis 10/27/2021. FINDINGS: Lower chest: Pacemaker lead terminating in the right ventricle. Moderate to large hiatal hernia. Small right pleural effusion lying  dependently. Passive areas of  subsegmental atelectasis and/or scarring are noted in the lower lobes of the lungs bilaterally. Atherosclerotic calcifications in the distal descending thoracic aorta. Hepatobiliary: No definite suspicious cystic or solid hepatic lesions are confidently identified. Diffuse periportal edema is noted, nonspecific. No intra or extrahepatic biliary ductal dilatation. Common bile duct measures 4 mm in the porta hepatis. Status post cholecystectomy. Pancreas: Inflammatory changes are again noted surrounding the pancreas, most evident adjacent to the pancreatic head, uncinate process and proximal pancreatic body where there is fluid and fat stranding surrounding the pancreas and adjacent structures, including the second and third portions of the duodenum. Pancreatic parenchyma enhances normally. No pancreatic ductal dilatation. Spleen: Unremarkable. Adrenals/Urinary Tract: 11 mm low-attenuation nonenhancing lesion in the anterior aspect of the upper pole of the left kidney is compatible with a small simple cyst (Bosniak class 1, no imaging follow-up recommended). Right kidney and bilateral adrenal glands are normal in appearance. No hydroureteronephrosis. Urinary bladder is normal in appearance. Stomach/Bowel: The appearance of the stomach is normal. No pathologic dilatation of small bowel or colon. Extensive inflammatory changes and small volume of non organized fluid noted adjacent to the second and third portions of the duodenum. The appendix is not confidently identified and may be surgically absent. Regardless, there are no inflammatory changes noted adjacent to the cecum to suggest the presence of an acute appendicitis at this time. Vascular/Lymphatic: Atherosclerosis in the abdominal aorta and pelvic vasculature, without evidence of aneurysm or dissection. Superior mesenteric vein, splenoportal confluence, splenic vein and portal vein all remain patent at this time. No definite  lymphadenopathy noted in the abdomen or pelvis. Reproductive: Uterus and ovaries are atrophic but otherwise unremarkable in appearance. Other: Inflammatory changes and small amount of fluid adjacent to the head, uncinate process and proximal body of the pancreas, extending into adjacent portions of the retroperitoneum. No well organized fluid collection to clearly indicate the presence of a pseudocyst at this time. No significant volume of ascites. No pneumoperitoneum. Musculoskeletal: There are no aggressive appearing lytic or blastic lesions noted in the visualized portions of the skeleton. IMPRESSION: 1. Imaging findings of acute pancreatitis redemonstrated, without substantial change compared to the recent prior study, as above. No well organized pancreatic pseudocyst identified at this time. 2. Periportal edema in the liver. This is a nonspecific finding, but correlation with liver function tests is recommended. 3. Small right pleural effusion lying dependently. 4. Aortic atherosclerosis. 5. Moderate to large hiatal hernia. 6. Additional incidental findings, as above. Electronically Signed   By: Vinnie Langton M.D.   On: 11/29/2021 06:33    Impression: Recurrent idiopathic pancreatitis - CT abdomen pelvis with contrast shows acute pancreatitis without pseudocyst.  Periportal edema in the liver.  Moderate to large hiatal hernia.  S/p cholecystectomy.  CBD 4 mm -Lipase 5772 -Initial WBC 10.9, improved to 6.2 -AST 176/ALT 223/alk phos 144/T. bili 1.3  A-fib on Eliquis   Plan: Recurrent episodes of pancreatitis, unknown etiology though possibly due to stones in bile duct.  Will get MRCP for further evaluation.  Patient is not amenable to procedures, although discussed possibility of ERCP in the future Can have clear liquid diet Continue supportive management [facility] will follow     LOS: 1 day   Garnette Scheuermann  PA-C 11/30/2021, 8:17 AM  Contact #  [phone]

## 2021-12-01 ENCOUNTER — Inpatient Hospital Stay (HOSPITAL_COMMUNITY): Payer: PPO

## 2021-12-01 ENCOUNTER — Encounter (HOSPITAL_COMMUNITY): Payer: Self-pay | Admitting: Internal Medicine

## 2021-12-01 DIAGNOSIS — K861 Other chronic pancreatitis: Secondary | ICD-10-CM | POA: Diagnosis not present

## 2021-12-01 DIAGNOSIS — K859 Acute pancreatitis without necrosis or infection, unspecified: Secondary | ICD-10-CM | POA: Diagnosis not present

## 2021-12-01 LAB — COMPREHENSIVE METABOLIC PANEL
ALT: 137 U/L — ABNORMAL HIGH (ref 0–44)
AST: 64 U/L — ABNORMAL HIGH (ref 15–41)
Albumin: 2.9 g/dL — ABNORMAL LOW (ref 3.5–5.0)
Alkaline Phosphatase: 123 U/L (ref 38–126)
Anion gap: 6 (ref 5–15)
BUN: 10 mg/dL (ref 8–23)
CO2: 25 mmol/L (ref 22–32)
Calcium: 9 mg/dL (ref 8.9–10.3)
Chloride: 105 mmol/L (ref 98–111)
Creatinine, Ser: 0.94 mg/dL (ref 0.44–1.00)
GFR, Estimated: 58 mL/min — ABNORMAL LOW (ref >60)
Glucose, Bld: 86 mg/dL (ref 70–99)
Potassium: 3.9 mmol/L (ref 3.5–5.1)
Sodium: 136 mmol/L (ref 135–145)
Total Bilirubin: 1 mg/dL (ref 0.3–1.2)
Total Protein: 5.5 g/dL — ABNORMAL LOW (ref 6.5–8.1)

## 2021-12-01 LAB — CBC WITH DIFFERENTIAL/PLATELET
Abs Immature Granulocytes: 0.02 10*3/uL (ref 0.00–0.07)
Basophils Absolute: 0 10*3/uL (ref 0.0–0.1)
Basophils Relative: 1 %
Eosinophils Absolute: 0.3 10*3/uL (ref 0.0–0.5)
Eosinophils Relative: 4 %
HCT: 37 % (ref 36.0–46.0)
Hemoglobin: 12.1 g/dL (ref 12.0–15.0)
Immature Granulocytes: 0 %
Lymphocytes Relative: 15 %
Lymphs Abs: 1.2 10*3/uL (ref 0.7–4.0)
MCH: 30.1 pg (ref 26.0–34.0)
MCHC: 32.7 g/dL (ref 30.0–36.0)
MCV: 92 fL (ref 80.0–100.0)
Monocytes Absolute: 0.6 10*3/uL (ref 0.1–1.0)
Monocytes Relative: 8 %
Neutro Abs: 5.4 10*3/uL (ref 1.7–7.7)
Neutrophils Relative %: 72 %
Platelets: 158 10*3/uL (ref 150–400)
RBC: 4.02 MIL/uL (ref 3.87–5.11)
RDW: 13.2 % (ref 11.5–15.5)
WBC: 7.5 10*3/uL (ref 4.0–10.5)
nRBC: 0 % (ref 0.0–0.2)

## 2021-12-01 LAB — GLUCOSE, CAPILLARY
Glucose-Capillary: 100 mg/dL — ABNORMAL HIGH (ref 70–99)
Glucose-Capillary: 109 mg/dL — ABNORMAL HIGH (ref 70–99)
Glucose-Capillary: 180 mg/dL — ABNORMAL HIGH (ref 70–99)
Glucose-Capillary: 78 mg/dL (ref 70–99)
Glucose-Capillary: 88 mg/dL (ref 70–99)
Glucose-Capillary: 90 mg/dL (ref 70–99)

## 2021-12-01 LAB — C4 COMPLEMENT: Complement C4, Body Fluid: 13 mg/dL (ref 12–38)

## 2021-12-01 LAB — PHOSPHORUS: Phosphorus: 3.2 mg/dL (ref 2.5–4.6)

## 2021-12-01 LAB — MAGNESIUM: Magnesium: 1.7 mg/dL (ref 1.7–2.4)

## 2021-12-01 MED ORDER — POLYVINYL ALCOHOL 1.4 % OP SOLN
1.0000 [drp] | OPHTHALMIC | Status: DC | PRN
  Filled 2021-12-01: qty 15

## 2021-12-01 MED ORDER — ENOXAPARIN SODIUM 40 MG/0.4ML IJ SOSY
40.0000 mg | PREFILLED_SYRINGE | INTRAMUSCULAR | Status: DC
  Administered 2021-12-01: 40 mg via SUBCUTANEOUS

## 2021-12-01 MED ORDER — GADOBUTROL 1 MMOL/ML IV SOLN
7.0000 mL | Freq: Once | INTRAVENOUS | Status: AC | PRN
  Administered 2021-12-01: 7 mL via INTRAVENOUS

## 2021-12-01 NOTE — Progress Notes (Signed)
  Transition of Care [facility]) Screening Note   Patient Details  Name: [facility] Date of Birth: [birth year]   Transition of [facility]) CM/SW Contact:    Otelia Santee, LCSW Phone Number: 12/01/2021, 8:27 AM    Transition of Care Department [facility]) has reviewed patient and no TOC needs have been identified at this time. We will continue to monitor patient advancement through interdisciplinary progression rounds. If new patient transition needs arise, please place a TOC consult.

## 2021-12-01 NOTE — Progress Notes (Signed)
Mobility Specialist - Progress Note   12/01/21 1039  Oxygen Therapy  SpO2 95 %  O2 Device Room Air  Mobility  Activity Ambulated with assistance in hallway  Level of Assistance Standby assist, set-up cues, supervision of patient - no hands on  Assistive Device Front wheel walker  Distance Ambulated (ft) 500 ft  Activity Response Tolerated well  Mobility Referral Yes  $Mobility charge 1 Mobility   Nurse requested Mobility Specialist to perform oxygen saturation test with pt which includes removing pt from oxygen both at rest and while ambulating.  Below are the results from that testing.     Patient Saturations on Room Air at Rest = spO2 97% Patient Saturations on Room Air while Ambulating = sp02 95% .   At end of testing pt left in room on 0 Liters of oxygen per nurse request.   Reported results to nurse.   Pt received in bed and agreed to walking O2 test. Pt had no c/o pain, some discomfort during ambulation in lower abdomen. Pt returned to chair with all needs met.  Roderick Pee Mobility Specialist

## 2021-12-01 NOTE — Progress Notes (Signed)
[facility] Gastroenterology Progress Note  RONNY RUDDELL 88 y.o. 07-13-1930  CC: Acute pancreatitis   Subjective: Patient tolerating clear liquids without difficulty.  States she has had some mild epigastric pain that started in the middle of the night.  Scheduled for MRCP later today.  ROS : Review of Systems  Constitutional:  Negative for chills, fever and weight loss.  Gastrointestinal:  Positive for abdominal pain. Negative for blood in stool, constipation, diarrhea, heartburn, melena, nausea and vomiting.      Objective: Vital signs in last 24 hours: Vitals:   11/30/21 2021 12/01/21 0419  BP: 130/75 117/67  Pulse: 80 60  Resp: 18 18  Temp: 98.1 F (36.7 C) 97.9 F (36.6 C)  SpO2: 99% 100%    Physical Exam:  General:  Alert, cooperative, no distress, appears stated age  Head:  Normocephalic, without obvious abnormality, atraumatic  Eyes:  Anicteric sclera, EOM's intact  Lungs:   Clear to auscultation bilaterally, respirations unlabored  Heart:  Regular rate and rhythm, S1, S2 normal  Abdomen:   Soft, mild epigastric tenderness, bowel sounds active all four quadrants,  no masses,   Extremities: Extremities normal, atraumatic, no  edema  Pulses: 2+ and symmetric    Lab Results: Recent Labs    11/30/21 0509 12/01/21 0514  NA 140 136  K 3.9 3.9  CL 108 105  CO2 23 25  GLUCOSE 88 86  BUN 10 10  CREATININE 0.91 0.94  CALCIUM 9.0 9.0  MG  --  1.7  PHOS  --  3.2   Recent Labs    11/30/21 0509 12/01/21 0514  AST 176* 64*  ALT 223* 137*  ALKPHOS 144* 123  BILITOT 1.3* 1.0  PROT 6.0* 5.5*  ALBUMIN 3.3* 2.9*   Recent Labs    11/30/21 0509 12/01/21 0514  WBC 6.2 7.5  NEUTROABS  --  5.4  HGB 12.6 12.1  HCT 38.1 37.0  MCV 91.1 92.0  PLT 157 158   No results for input(s): "LABPROT", "INR" in the last 72 hours.    Assessment Recurrent idiopathic pancreatitis - CT abdomen pelvis with contrast shows acute pancreatitis without pseudocyst.  Periportal  edema in the liver.  Moderate to large hiatal hernia.  S/p cholecystectomy.  CBD 4 mm -Lipase 293, improved from 5772 -Initial WBC 10.9, improved to 7.5 -LFTs trending down   A-fib on Eliquis   Plan: Planning for MRCP today for further evaluation of recurrent idiopathic pancreatitis.  Patient has no interest in procedures at this time even though ERCP and possibly EUS may be useful for her.  Patient adamantly declines procedures.  Will await MRCP results.  LFTs trending down Can advance diet after MRCP to soft diet as tolerated Continue adequate pain management [facility] will follow  Legrand Como PA-C 12/01/2021, 9:12 AM  Contact #  [phone]

## 2021-12-01 NOTE — Progress Notes (Signed)
PROGRESS NOTE    Danielle Colon [facility]  TGG:[id number] DOB: [birth year] DOA: 11/29/2021 PCP: Daisy Floro, MD    Brief Narrative:  88 year old with history of A-fib, hypertension, hyperlipidemia and recently developing pancreatitis, history of cholecystectomy 42 years ago presented with another episode of abdominal pain and nausea.  She was in the hospital 10/11-10/13 with acute idiopathic pancreatitis and she was symptomatically treated and got better.  These episodes she attributes to eating salmon. At the emergency room, hemodynamically stable.  Lipase level 5772, AST and ALT mildly elevated.  Total bilirubin 1.3.  CT scan abdomen pelvis showed acute pancreatitis without pseudocyst, periportal liver edema.  Large hiatal hernia.  Assessment & Plan:   Acute biliary pancreatitis, recurrent episode: Symptoms with some improvement today, tolerating clears. Continue maintenance IV fluids.  Adequate pain medications.  Biochemically improving.  Lipase normalized.  LFTs trending down. Primary cause unknown. Status postcholecystectomy, suspect frequent biliary stone.  MRCP today.  Anticipating ERCP if patient agrees, however she may not agree. No alcohol use. Triglycerides normal. Followed by GI.  CKD stage IIIa: At about baseline.  Monitor on treatment.  Atrial fibrillation: Permanent atrial fibrillation.  Rate controlled on diltiazem.  She has a pacemaker.  She is on Eliquis.  Hold Eliquis until procedures if any needed.   DVT prophylaxis: Eliquis, on hold   Code Status: DNR Family Communication: None at the bedside Disposition Plan: Status is: Inpatient Remains inpatient appropriate because: Acute pancreatitis, inpatient procedures anticipated     Consultants:  Gastroenterology  Procedures:  None  Antimicrobials:  None   Subjective:  Patient was seen and examined.  No overnight events.  Eyes are dry.  She tells me she has some hunger pain but no other pain.  She tells me  she may likely not go for any procedure.  Objective: Vitals:   11/30/21 0726 11/30/21 1440 11/30/21 2021 12/01/21 0419  BP: 128/85 109/73 130/75 117/67  Pulse: 82 75 80 60  Resp: 18 20 18 18   Temp: 98 F (36.7 C) 97.9 F (36.6 C) 98.1 F (36.7 C) 97.9 F (36.6 C)  TempSrc: Oral Oral Oral Oral  SpO2: 99% 99% 99% 100%  Weight:        Intake/Output Summary (Last 24 hours) at 12/01/2021 1038 Last data filed at 12/01/2021 0906 Gross per 24 hour  Intake 1832.91 ml  Output 2950 ml  Net -1117.09 ml   Filed Weights   11/29/21 0440 11/29/21 2029 11/30/21 0423  Weight: 73 kg 72.9 kg 72.9 kg    Examination:  General exam: Appears calm and comfortable , interactive. Respiratory system: No added sounds.  On room air for Cardiovascular system: S1 & S2 heard, pacer in place.   Gastrointestinal system: Soft.  Mildly tender along the epigastrium on deep palpation.  No rigidity or guarding.  Bowel sound present. Central nervous system: Alert and oriented. No focal neurological deficits. Extremities: Symmetric 5 x 5 power. Skin: No rashes, lesions or ulcers Psychiatry: Judgement and insight appear normal. Mood & affect appropriate.     Data Reviewed: I have personally reviewed following labs and imaging studies  CBC: Recent Labs  Lab 11/29/21 0442 11/30/21 0509 12/01/21 0514  WBC 10.9* 6.2 7.5  NEUTROABS  --   --  5.4  HGB 13.1 12.6 12.1  HCT 39.2 38.1 37.0  MCV 90.5 91.1 92.0  PLT 183 157 158   Basic Metabolic Panel: Recent Labs  Lab 11/29/21 0442 11/30/21 0509 12/01/21 0514  NA 139 140 136  K 3.7 3.9 3.9  CL 102 108 105  CO2 26 23 25   GLUCOSE 175* 88 86  BUN 15 10 10   CREATININE 1.18* 0.91 0.94  CALCIUM 10.1 9.0 9.0  MG  --   --  1.7  PHOS  --   --  3.2   GFR: Estimated Creatinine Clearance: 37.9 mL/min (by C-G formula based on SCr of 0.94 mg/dL). Liver Function Tests: Recent Labs  Lab 11/29/21 0442 11/30/21 0509 12/01/21 0514  AST 17 176* 64*  ALT 15  223* 137*  ALKPHOS 99 144* 123  BILITOT 0.5 1.3* 1.0  PROT 7.5 6.0* 5.5*  ALBUMIN 4.3 3.3* 2.9*   Recent Labs  Lab 11/29/21 0442 11/30/21 0509  LIPASE 5,772* 293*   No results for input(s): "AMMONIA" in the last 168 hours. Coagulation Profile: No results for input(s): "INR", "PROTIME" in the last 168 hours. Cardiac Enzymes: No results for input(s): "CKTOTAL", "CKMB", "CKMBINDEX", "TROPONINI" in the last 168 hours. BNP (last 3 results) No results for input(s): "PROBNP" in the last 8760 hours. HbA1C: No results for input(s): "HGBA1C" in the last 72 hours. CBG: Recent Labs  Lab 11/30/21 1156 11/30/21 1636 11/30/21 2303 12/01/21 0422 12/01/21 0801  GLUCAP 99 93 81 88 78   Lipid Profile: Recent Labs    11/29/21 2144  TRIG 64   Thyroid Function Tests: No results for input(s): "TSH", "T4TOTAL", "FREET4", "T3FREE", "THYROIDAB" in the last 72 hours. Anemia Panel: No results for input(s): "VITAMINB12", "FOLATE", "FERRITIN", "TIBC", "IRON", "RETICCTPCT" in the last 72 hours. Sepsis Labs: No results for input(s): "PROCALCITON", "LATICACIDVEN" in the last 168 hours.  No results found for this or any previous visit (from the past 240 hour(s)).       Radiology Studies: No results found.      Scheduled Meds:  diltiazem  180 mg Oral BID   pantoprazole (PROTONIX) IV  40 mg Intravenous Q24H   Continuous Infusions:  lactated ringers 150 mL/hr at 12/01/21 0917     LOS: 2 days    Time spent: 35 minutes    Barb Merino, MD [facility] Pager [phone]

## 2021-12-02 DIAGNOSIS — K861 Other chronic pancreatitis: Secondary | ICD-10-CM | POA: Diagnosis not present

## 2021-12-02 DIAGNOSIS — K859 Acute pancreatitis without necrosis or infection, unspecified: Secondary | ICD-10-CM | POA: Diagnosis not present

## 2021-12-02 LAB — COMPREHENSIVE METABOLIC PANEL
ALT: 93 U/L — ABNORMAL HIGH (ref 0–44)
AST: 32 U/L (ref 15–41)
Albumin: 2.9 g/dL — ABNORMAL LOW (ref 3.5–5.0)
Alkaline Phosphatase: 118 U/L (ref 38–126)
Anion gap: 6 (ref 5–15)
BUN: 8 mg/dL (ref 8–23)
CO2: 26 mmol/L (ref 22–32)
Calcium: 9.2 mg/dL (ref 8.9–10.3)
Chloride: 108 mmol/L (ref 98–111)
Creatinine, Ser: 0.86 mg/dL (ref 0.44–1.00)
GFR, Estimated: >60 mL/min (ref >60)
Glucose, Bld: 92 mg/dL (ref 70–99)
Potassium: 3.8 mmol/L (ref 3.5–5.1)
Sodium: 140 mmol/L (ref 135–145)
Total Bilirubin: 0.7 mg/dL (ref 0.3–1.2)
Total Protein: 5.6 g/dL — ABNORMAL LOW (ref 6.5–8.1)

## 2021-12-02 LAB — GLUCOSE, CAPILLARY
Glucose-Capillary: 98 mg/dL (ref 70–99)
Glucose-Capillary: 93 mg/dL (ref 70–99)

## 2021-12-02 LAB — LIPASE, BLOOD: Lipase: 41 U/L (ref 11–51)

## 2021-12-02 MED ORDER — OXYCODONE HCL 5 MG PO TABS: 5.0000 mg | ORAL_TABLET | Freq: Three times a day (TID) | ORAL | 0 refills | Status: DC | PRN

## 2021-12-02 MED ORDER — APIXABAN 2.5 MG PO TABS
2.5000 mg | ORAL_TABLET | Freq: Two times a day (BID) | ORAL | Status: DC
  Administered 2021-12-02: 2.5 mg via ORAL
  Filled 2021-12-02: qty 1

## 2021-12-02 NOTE — Discharge Summary (Signed)
Physician Discharge Summary  Lyrique Hakim [facility] VHQ:[id number] DOB: 1930-12-14 DOA: 11/29/2021  PCP: Daisy Floro, MD  Admit date: 11/29/2021 Discharge date: 12/02/2021  Admitted From: Home Disposition: Home  Recommendations for Outpatient Follow-up:  Follow up with PCP in 1-2 weeks GI clinic will schedule follow-up  Home Health: N/A Equipment/Devices: N/A  Discharge Condition: Stable CODE STATUS: DNR Diet recommendation: Regular diet, soft diet.  Avoid any fatty food or greasy food.  Discharge summary: 88 year old with history of A-fib, hypertension, hyperlipidemia and recently developing pancreatitis, history of cholecystectomy 42 years ago presented with another episode of abdominal pain and nausea.  She was in the hospital 10/11-10/13 with acute idiopathic pancreatitis and she was symptomatically treated and got better.  These episodes she attributes to eating salmon. At the emergency room, hemodynamically stable.  Lipase level 5772, AST and ALT mildly elevated.  Total bilirubin 1.3.  CT scan abdomen pelvis showed acute pancreatitis without pseudocyst, periportal liver edema.  Large hiatal hernia.  She was admitted for symptomatic treatment.  Followed by gastroenterology.  Acute idiopathic pancreatitis, likely secondary to pancreatic divisum.  Second episode. Treated with liquid diet, pain medications and IV fluids.  Symptoms mostly improved. Lipase was more than 5000-rapidly normalized.  LFTs normalized. Status postcholecystectomy 1971.  MRCP shows normal bile duct with no evidence of stone but it does show pancreatic divisum.  Likely no treatment on this patient who even does not want to have endoscopy for ERCP and biopsies. No alcohol use. Triglycerides normal. Patient was followed by gastroenterology in the hospital.  With clinical improvement, patient is going home today. Her pain is managed with Tylenol, she will be prescribed short course of oxycodone primary gram every 8  hours as needed for moderate pain.  This could be a recurrent event and she was advised to monitor for any symptoms and report back for any worsening abdominal pain.   CKD stage IIIa: At about baseline.  Stable.   Atrial fibrillation: Permanent atrial fibrillation.  Rate controlled on diltiazem.  She has a pacemaker.  She is on Eliquis that was held in case any procedures needed.  She will go back on Eliquis today.   Symptoms controlled.  Stable for discharge.     Discharge Diagnoses:  Principal Problem:   Acute on chronic pancreatitis (HCC) Active Problems:   Stage 3a chronic kidney disease (CKD) (HCC)   HTN (hypertension)   Atrial fibrillation (HCC)   Pacemaker   DNR (do not resuscitate)    Discharge Instructions  Discharge Instructions     Call MD for:  persistant nausea and vomiting   Complete by: As directed    Call MD for:  severe uncontrolled pain   Complete by: As directed    Diet - low sodium heart healthy   Complete by: As directed    Increase activity slowly   Complete by: As directed       Allergies as of 12/02/2021       Reactions   Carafate [sucralfate] Other (See Comments)   Rigor   Cephalosporins Other (See Comments)   Keflex/Cephalexin- chills   Tessalon Perles [benzonatate] Other (See Comments)   Unknown reaction   Neurontin [gabapentin] Other (See Comments)   Unknown reaction   Zantac [ranitidine Hcl] Other (See Comments)   GI upset        Medication List     TAKE these medications    acetaminophen 500 MG tablet Commonly known as: TYLENOL Take 500 mg by mouth 2 (two) times  daily as needed for mild pain.   Creon 24000-76000 units Cpep Generic drug: Pancrelipase (Lip-Prot-Amyl) Take 24,000 Units by mouth with breakfast, with lunch, and with evening meal.   diltiazem 180 MG 24 hr capsule Commonly known as: CARDIZEM CD Take 1 capsule (180 mg total) by mouth 2 (two) times daily. What changed: Another medication with the same name was  removed. Continue taking this medication, and follow the directions you see here.   docusate sodium 100 MG capsule Commonly known as: Colace Take 1 capsule (100 mg total) by mouth 2 (two) times daily as needed for mild constipation. What changed: when to take this   DRY EYES OP Place 1 application  into both eyes at bedtime.   Eliquis 2.5 MG Tabs tablet Generic drug: apixaban Take 2.5 mg by mouth 2 (two) times daily.   oxyCODONE 5 MG immediate release tablet Commonly known as: Roxicodone Take 1 tablet (5 mg total) by mouth every 8 (eight) hours as needed.   pantoprazole 40 MG tablet Commonly known as: PROTONIX Take 40 mg by mouth daily before breakfast.   Vitamin D3 1000 units Caps Take 1,000 Units by mouth daily.        Allergies  Allergen Reactions   Carafate [Sucralfate] Other (See Comments)    Rigor   Cephalosporins Other (See Comments)    Keflex/Cephalexin- chills   Tessalon Perles [Benzonatate] Other (See Comments)    Unknown reaction   Neurontin [Gabapentin] Other (See Comments)    Unknown reaction    Zantac [Ranitidine Hcl] Other (See Comments)    GI upset    Consultations: Gastroenterology   Procedures/Studies: MR ABDOMEN MRCP W WO CONTAST  Result Date: 12/01/2021 CLINICAL DATA:  History of persistent pancreatitis EXAM: MRI ABDOMEN WITHOUT AND WITH CONTRAST (INCLUDING MRCP) TECHNIQUE: Multiplanar multisequence MR imaging of the abdomen was performed both before and after the administration of intravenous contrast. Heavily T2-weighted images of the biliary and pancreatic ducts were obtained, and three-dimensional MRCP images were rendered by post processing. CONTRAST:  7mL GADAVIST GADOBUTROL 1 MMOL/ML IV SOLN COMPARISON:  Multiple priors including CT November 29, 2021 and February 28, 2019. FINDINGS: Lower chest: Small right-greater-than-left pleural effusions with adjacent consolidation/atelectasis. Small hiatal hernia. Hepatobiliary: Mild periportal  edema. No suspicious hepatic lesion. Gallbladder surgically absent. Mild prominence of the biliary tree is similar prior with the common duct measuring 6 mm likely reflecting a combination of senescent change and reservoir effect post cholecystectomy. No choledocholithiasis. Pancreas: No pancreatic ductal dilation. Pancreatic divisum. Intrinsic T1 signal of the pancreatic parenchyma is decreased with increased intrinsic T2 signal. Peripancreatic fluid without extra pancreatic fluid collection. Homogeneous enhancement of the pancreas postcontrast administration. Tiny 5 mm fluid signal focus in the pancreatic head on image 24/3. Spleen:  No splenomegaly or focal splenic lesion. Adrenals/Urinary Tract: Bilateral adrenal glands appear normal. No hydronephrosis. 11 mm fluid signal lesion in the left kidney on image 18/3 does not demonstrate postcontrast enhancement and is compatible with a benign cyst requiring no imaging follow-up. Stomach/Bowel: Mild duodenal wall thickening is favored reactive. No evidence of bowel obstruction. Vascular/Lymphatic: Normal caliber abdominal aorta. No pathologically enlarged abdominal lymph nodes. The portal, splenic and superior mesenteric veins appear patent. Other: 14 mm right ovarian cyst on image 16/4 is considered benign and requires no imaging follow-up. Musculoskeletal: No suspicious bone lesions identified. Multilevel degenerative changes spine. IMPRESSION: 1. Acute interstitial pancreatitis. No evidence of pancreatic necrosis. 2. Pancreatic divisum which is an anatomic variant that can predispose to pancreatitis. 3. Tiny  5 mm fluid signal focus in the pancreatic head is favored to reflect a small pseudocyst versus tiny side branch IPMN. Recommend follow up pre and post contrast MRI/MRCP in 2 years. 4. Small right-greater-than-left pleural effusions with adjacent consolidation/atelectasis. 5. Small hiatal hernia. Electronically Signed   By: Maudry Mayhew M.D.   On: 12/01/2021  15:29   MR 3D Recon At Scanner  Result Date: 12/01/2021 CLINICAL DATA:  History of persistent pancreatitis EXAM: MRI ABDOMEN WITHOUT AND WITH CONTRAST (INCLUDING MRCP) TECHNIQUE: Multiplanar multisequence MR imaging of the abdomen was performed both before and after the administration of intravenous contrast. Heavily T2-weighted images of the biliary and pancreatic ducts were obtained, and three-dimensional MRCP images were rendered by post processing. CONTRAST:  22mL GADAVIST GADOBUTROL 1 MMOL/ML IV SOLN COMPARISON:  Multiple priors including CT November 29, 2021 and February 28, 2019. FINDINGS: Lower chest: Small right-greater-than-left pleural effusions with adjacent consolidation/atelectasis. Small hiatal hernia. Hepatobiliary: Mild periportal edema. No suspicious hepatic lesion. Gallbladder surgically absent. Mild prominence of the biliary tree is similar prior with the common duct measuring 6 mm likely reflecting a combination of senescent change and reservoir effect post cholecystectomy. No choledocholithiasis. Pancreas: No pancreatic ductal dilation. Pancreatic divisum. Intrinsic T1 signal of the pancreatic parenchyma is decreased with increased intrinsic T2 signal. Peripancreatic fluid without extra pancreatic fluid collection. Homogeneous enhancement of the pancreas postcontrast administration. Tiny 5 mm fluid signal focus in the pancreatic head on image 24/3. Spleen:  No splenomegaly or focal splenic lesion. Adrenals/Urinary Tract: Bilateral adrenal glands appear normal. No hydronephrosis. 11 mm fluid signal lesion in the left kidney on image 18/3 does not demonstrate postcontrast enhancement and is compatible with a benign cyst requiring no imaging follow-up. Stomach/Bowel: Mild duodenal wall thickening is favored reactive. No evidence of bowel obstruction. Vascular/Lymphatic: Normal caliber abdominal aorta. No pathologically enlarged abdominal lymph nodes. The portal, splenic and superior mesenteric  veins appear patent. Other: 14 mm right ovarian cyst on image 16/4 is considered benign and requires no imaging follow-up. Musculoskeletal: No suspicious bone lesions identified. Multilevel degenerative changes spine. IMPRESSION: 1. Acute interstitial pancreatitis. No evidence of pancreatic necrosis. 2. Pancreatic divisum which is an anatomic variant that can predispose to pancreatitis. 3. Tiny 5 mm fluid signal focus in the pancreatic head is favored to reflect a small pseudocyst versus tiny side branch IPMN. Recommend follow up pre and post contrast MRI/MRCP in 2 years. 4. Small right-greater-than-left pleural effusions with adjacent consolidation/atelectasis. 5. Small hiatal hernia. Electronically Signed   By: Maudry Mayhew M.D.   On: 12/01/2021 15:29   CT ABDOMEN PELVIS W CONTRAST  Result Date: 11/29/2021 CLINICAL DATA:  88 year old female with history of epigastric pain. Recent hospital admission for pancreatitis. Constipation, nausea and vomiting. EXAM: CT ABDOMEN AND PELVIS WITH CONTRAST TECHNIQUE: Multidetector CT imaging of the abdomen and pelvis was performed using the standard protocol following bolus administration of intravenous contrast. RADIATION DOSE REDUCTION: This exam was performed according to the departmental dose-optimization program which includes automated exposure control, adjustment of the mA and/or kV according to patient size and/or use of iterative reconstruction technique. CONTRAST:  3mL OMNIPAQUE IOHEXOL 300 MG/ML  SOLN COMPARISON:  CT the abdomen and pelvis 10/27/2021. FINDINGS: Lower chest: Pacemaker lead terminating in the right ventricle. Moderate to large hiatal hernia. Small right pleural effusion lying dependently. Passive areas of subsegmental atelectasis and/or scarring are noted in the lower lobes of the lungs bilaterally. Atherosclerotic calcifications in the distal descending thoracic aorta. Hepatobiliary: No definite suspicious cystic or solid  hepatic lesions are  confidently identified. Diffuse periportal edema is noted, nonspecific. No intra or extrahepatic biliary ductal dilatation. Common bile duct measures 4 mm in the porta hepatis. Status post cholecystectomy. Pancreas: Inflammatory changes are again noted surrounding the pancreas, most evident adjacent to the pancreatic head, uncinate process and proximal pancreatic body where there is fluid and fat stranding surrounding the pancreas and adjacent structures, including the second and third portions of the duodenum. Pancreatic parenchyma enhances normally. No pancreatic ductal dilatation. Spleen: Unremarkable. Adrenals/Urinary Tract: 11 mm low-attenuation nonenhancing lesion in the anterior aspect of the upper pole of the left kidney is compatible with a small simple cyst (Bosniak class 1, no imaging follow-up recommended). Right kidney and bilateral adrenal glands are normal in appearance. No hydroureteronephrosis. Urinary bladder is normal in appearance. Stomach/Bowel: The appearance of the stomach is normal. No pathologic dilatation of small bowel or colon. Extensive inflammatory changes and small volume of non organized fluid noted adjacent to the second and third portions of the duodenum. The appendix is not confidently identified and may be surgically absent. Regardless, there are no inflammatory changes noted adjacent to the cecum to suggest the presence of an acute appendicitis at this time. Vascular/Lymphatic: Atherosclerosis in the abdominal aorta and pelvic vasculature, without evidence of aneurysm or dissection. Superior mesenteric vein, splenoportal confluence, splenic vein and portal vein all remain patent at this time. No definite lymphadenopathy noted in the abdomen or pelvis. Reproductive: Uterus and ovaries are atrophic but otherwise unremarkable in appearance. Other: Inflammatory changes and small amount of fluid adjacent to the head, uncinate process and proximal body of the pancreas, extending into  adjacent portions of the retroperitoneum. No well organized fluid collection to clearly indicate the presence of a pseudocyst at this time. No significant volume of ascites. No pneumoperitoneum. Musculoskeletal: There are no aggressive appearing lytic or blastic lesions noted in the visualized portions of the skeleton. IMPRESSION: 1. Imaging findings of acute pancreatitis redemonstrated, without substantial change compared to the recent prior study, as above. No well organized pancreatic pseudocyst identified at this time. 2. Periportal edema in the liver. This is a nonspecific finding, but correlation with liver function tests is recommended. 3. Small right pleural effusion lying dependently. 4. Aortic atherosclerosis. 5. Moderate to large hiatal hernia. 6. Additional incidental findings, as above. Electronically Signed   By: Trudie Reed M.D.   On: 11/29/2021 06:33   (Echo, Carotid, EGD, Colonoscopy, ERCP)    Subjective: Patient seen in the morning rounds.  She has very mild pain on the left lateral quadrant but denies any nausea vomiting.  She did well on soft diet.  MRI findings were explained to her.   Patient was asking me whether she will keep getting hospitalized due to this.  This is likely the case.  We discussed about avoiding any triggers. She is very eager to go home.   Discharge Exam: Vitals:   12/01/21 1927 12/02/21 0628  BP: 113/67 129/64  Pulse: 60 66  Resp: 16 16  Temp: 98.5 F (36.9 C) 98.3 F (36.8 C)  SpO2: 98% 95%   Vitals:   12/01/21 1039 12/01/21 1900 12/01/21 1927 12/02/21 0628  BP:   113/67 129/64  Pulse:   60 66  Resp:   16 16  Temp:   98.5 F (36.9 C) 98.3 F (36.8 C)  TempSrc:   Oral Oral  SpO2: 95%  98% 95%  Weight:  72.9 kg    Height:  5' 2.01" (1.575 m)  General: Pt is alert, awake, not in acute distress Very interactive and independent 88 year-old female. Cardiovascular: RRR, S1/S2 +, no rubs, no gallops Respiratory: CTA bilaterally, no  wheezing, no rhonchi Abdominal: Soft, NT, ND, bowel sounds + Extremities: no edema, no cyanosis    The results of significant diagnostics from this hospitalization (including imaging, microbiology, ancillary and laboratory) are listed below for reference.     Microbiology: No results found for this or any previous visit (from the past 240 hour(s)).   Labs: BNP (last 3 results) Recent Labs    12/08/20 0900  BNP 61.9   Basic Metabolic Panel: Recent Labs  Lab 11/29/21 0442 11/30/21 0509 12/01/21 0514 12/02/21 0508  NA 139 140 136 140  K 3.7 3.9 3.9 3.8  CL 102 108 105 108  CO2 26 23 25 26   GLUCOSE 175* 88 86 92  BUN 15 10 10 8   CREATININE 1.18* 0.91 0.94 0.86  CALCIUM 10.1 9.0 9.0 9.2  MG  --   --  1.7  --   PHOS  --   --  3.2  --    Liver Function Tests: Recent Labs  Lab 11/29/21 0442 11/30/21 0509 12/01/21 0514 12/02/21 0508  AST 17 176* 64* 32  ALT 15 223* 137* 93*  ALKPHOS 99 144* 123 118  BILITOT 0.5 1.3* 1.0 0.7  PROT 7.5 6.0* 5.5* 5.6*  ALBUMIN 4.3 3.3* 2.9* 2.9*   Recent Labs  Lab 11/29/21 0442 11/30/21 0509 12/02/21 0508  LIPASE 5,772* 293* 41   No results for input(s): "AMMONIA" in the last 168 hours. CBC: Recent Labs  Lab 11/29/21 0442 11/30/21 0509 12/01/21 0514  WBC 10.9* 6.2 7.5  NEUTROABS  --   --  5.4  HGB 13.1 12.6 12.1  HCT 39.2 38.1 37.0  MCV 90.5 91.1 92.0  PLT 183 157 158   Cardiac Enzymes: No results for input(s): "CKTOTAL", "CKMB", "CKMBINDEX", "TROPONINI" in the last 168 hours. BNP: Invalid input(s): "POCBNP" CBG: Recent Labs  Lab 12/01/21 1635 12/01/21 1924 12/01/21 2344 12/02/21 0521 12/02/21 0750  GLUCAP 90 180* 100* 98 93   D-Dimer No results for input(s): "DDIMER" in the last 72 hours. Hgb A1c No results for input(s): "HGBA1C" in the last 72 hours. Lipid Profile Recent Labs    11/29/21 2144  TRIG 64   Thyroid function studies No results for input(s): "TSH", "T4TOTAL", "T3FREE", "THYROIDAB" in  the last 72 hours.  Invalid input(s): "FREET3" Anemia work up No results for input(s): "VITAMINB12", "FOLATE", "FERRITIN", "TIBC", "IRON", "RETICCTPCT" in the last 72 hours. Urinalysis    Component Value Date/Time   COLORURINE COLORLESS (A) 12/08/2020 0845   APPEARANCEUR CLEAR 12/08/2020 0845   LABSPEC 1.007 12/08/2020 0845   PHURINE 7.0 12/08/2020 0845   GLUCOSEU NEGATIVE 12/08/2020 0845   HGBUR NEGATIVE 12/08/2020 0845   BILIRUBINUR NEGATIVE 12/08/2020 0845   KETONESUR NEGATIVE 12/08/2020 0845   PROTEINUR NEGATIVE 12/08/2020 0845   NITRITE NEGATIVE 12/08/2020 0845   LEUKOCYTESUR NEGATIVE 12/08/2020 0845   Sepsis Labs Recent Labs  Lab 11/29/21 0442 11/30/21 0509 12/01/21 0514  WBC 10.9* 6.2 7.5   Microbiology No results found for this or any previous visit (from the past 240 hour(s)).   Time coordinating discharge:  32 minutes  SIGNED:   Dorcas CarrowKuber Josehua Hammar, MD  [facility] 12/02/2021, 11:54 AM

## 2021-12-02 NOTE — Progress Notes (Signed)
[facility] Progress Note  Danielle Colon 88 y.o. Sep 28, 1930  CC: Acute pancreatitis   Subjective: Patient reports minimal epigastric pain.  Denies nausea vomiting.  Tolerating diet without difficulty.  Still not interested in any endoscopic procedures at this time  ROS : Review of Systems  Constitutional:  Negative for chills, fever and weight loss.  Gastrointestinal:  Positive for abdominal pain. Negative for blood in stool, constipation, diarrhea, heartburn, melena, nausea and vomiting.      Objective: Vital signs in last 24 hours: Vitals:   12/01/21 1927 12/02/21 0628  BP: 113/67 129/64  Pulse: 60 66  Resp: 16 16  Temp: 98.5 F (36.9 C) 98.3 F (36.8 C)  SpO2: 98% 95%    Physical Exam:  General:  Alert, cooperative, no distress, appears stated age  Head:  Normocephalic, without obvious abnormality, atraumatic  Eyes:  Anicteric sclera, EOM's intact  Lungs:   Clear to auscultation bilaterally, respirations unlabored  Heart:  Regular rate and rhythm, S1, S2 normal  Abdomen:   Soft, mild epigastric tenderness, bowel sounds active all four quadrants,  no masses,     Lab Results: Recent Labs    12/01/21 0514 12/02/21 0508  NA 136 140  K 3.9 3.8  CL 105 108  CO2 25 26  GLUCOSE 86 92  BUN 10 8  CREATININE 0.94 0.86  CALCIUM 9.0 9.2  MG 1.7  --   PHOS 3.2  --    Recent Labs    12/01/21 0514 12/02/21 0508  AST 64* 32  ALT 137* 93*  ALKPHOS 123 118  BILITOT 1.0 0.7  PROT 5.5* 5.6*  ALBUMIN 2.9* 2.9*   Recent Labs    11/30/21 0509 12/01/21 0514  WBC 6.2 7.5  NEUTROABS  --  5.4  HGB 12.6 12.1  HCT 38.1 37.0  MCV 91.1 92.0  PLT 157 158   No results for input(s): "LABPROT", "INR" in the last 72 hours.    Assessment Recurrent idiopathic pancreatitis - CT abdomen pelvis with contrast shows acute pancreatitis without pseudocyst.  Periportal edema in the liver.  Moderate to large hiatal hernia.  S/p cholecystectomy.  CBD 4 mm -Lipase  293, improved from 5772 -Initial WBC 10.9, improved to 7.5 -LFTs trending down -MRCP shows acute interstitial pancreatitis with no evidence of pancreatic necrosis.  Pancreatic divisum.  Tiny 5 mm fluid signal focus in pancreatic head favored to be small pseudocyst versus tiny sidebranch IPMN.  Follow-up MRCP in 2 years.  Small hiatal hernia.   A-fib on Eliquis  Plan: MRCP shows pancreatic divisum which is likely etiology of recurrent pancreatitis.  Patient not interested in ERCP/EUS at this time.  No need for ERCP since no choledocholithiasis seen on MRCP.  Repeat MRCP in 2 years as an outpatient. Pain has improved.  Tolerating diet without difficulty.  Patient would like to go home.  From a GI standpoint I think it would be fine to let her go home [facility] will sign off. Please contact us if we can be of any further assistance during this hospital stay.   Legrand Como PA-C 12/02/2021, 10:44 AM  Contact #  [phone]

## 2021-12-02 NOTE — Plan of Care (Signed)

## 2021-12-08 ENCOUNTER — Ambulatory Visit (INDEPENDENT_AMBULATORY_CARE_PROVIDER_SITE_OTHER): Payer: PPO

## 2021-12-08 DIAGNOSIS — I495 Sick sinus syndrome: Secondary | ICD-10-CM

## 2021-12-08 LAB — CUP PACEART REMOTE DEVICE CHECK
Battery Remaining Longevity: 147 mo
Battery Voltage: 3.02 V
Brady Statistic AP VP Percent: 0.03 %
Brady Statistic AP VS Percent: 64.39 %
Brady Statistic AS VP Percent: 0.02 %
Brady Statistic AS VS Percent: 35.41 %
Brady Statistic RA Percent Paced: 41.93 %
Brady Statistic RV Percent Paced: 3.83 %
Date Time Interrogation Session: 20231122011907
Implantable Lead Connection Status: 753985
Implantable Lead Connection Status: 753985
Implantable Lead Implant Date: 20210910
Implantable Lead Implant Date: 20210910
Implantable Lead Location: 753859
Implantable Lead Location: 753860
Implantable Lead Model: 5076
Implantable Lead Model: 5076
Implantable Pulse Generator Implant Date: 20210910
Lead Channel Impedance Value: 342 Ohm
Lead Channel Impedance Value: 399 Ohm
Lead Channel Impedance Value: 437 Ohm
Lead Channel Impedance Value: 456 Ohm
Lead Channel Pacing Threshold Amplitude: 0.5 V
Lead Channel Pacing Threshold Amplitude: 0.625 V
Lead Channel Pacing Threshold Pulse Width: 0.4 ms
Lead Channel Pacing Threshold Pulse Width: 0.4 ms
Lead Channel Sensing Intrinsic Amplitude: 1.125 mV
Lead Channel Sensing Intrinsic Amplitude: 1.125 mV
Lead Channel Sensing Intrinsic Amplitude: 5.875 mV
Lead Channel Sensing Intrinsic Amplitude: 5.875 mV
Lead Channel Setting Pacing Amplitude: 1.5 V
Lead Channel Setting Pacing Amplitude: 2.5 V
Lead Channel Setting Pacing Pulse Width: 0.4 ms
Lead Channel Setting Sensing Sensitivity: 1.2 mV
Zone Setting Status: 755011
Zone Setting Status: 755011

## 2021-12-13 DIAGNOSIS — Z09 Encounter for follow-up examination after completed treatment for conditions other than malignant neoplasm: Secondary | ICD-10-CM | POA: Diagnosis not present

## 2021-12-13 DIAGNOSIS — Z6829 Body mass index (BMI) 29.0-29.9, adult: Secondary | ICD-10-CM | POA: Diagnosis not present

## 2021-12-13 DIAGNOSIS — K8500 Idiopathic acute pancreatitis without necrosis or infection: Secondary | ICD-10-CM | POA: Diagnosis not present

## 2021-12-24 ENCOUNTER — Ambulatory Visit (INDEPENDENT_AMBULATORY_CARE_PROVIDER_SITE_OTHER): Payer: Medicare Other

## 2021-12-24 DIAGNOSIS — I495 Sick sinus syndrome: Secondary | ICD-10-CM

## 2021-12-24 LAB — CUP PACEART REMOTE DEVICE CHECK
Battery Remaining Longevity: 147 mo
Battery Voltage: 3.02 V
Brady Statistic AP VP Percent: 0.05 %
Brady Statistic AP VS Percent: 71 %
Brady Statistic AS VP Percent: 0 %
Brady Statistic AS VS Percent: 28.99 %
Brady Statistic RA Percent Paced: 46.67 %
Brady Statistic RV Percent Paced: 6.75 %
Date Time Interrogation Session: 20231208011942
Implantable Lead Connection Status: 753985
Implantable Lead Connection Status: 753985
Implantable Lead Implant Date: 20210910
Implantable Lead Implant Date: 20210910
Implantable Lead Location: 753859
Implantable Lead Location: 753860
Implantable Lead Model: 5076
Implantable Lead Model: 5076
Implantable Pulse Generator Implant Date: 20210910
Lead Channel Impedance Value: 304 Ohm
Lead Channel Impedance Value: 399 Ohm
Lead Channel Impedance Value: 456 Ohm
Lead Channel Impedance Value: 456 Ohm
Lead Channel Pacing Threshold Amplitude: 0.5 V
Lead Channel Pacing Threshold Amplitude: 0.5 V
Lead Channel Pacing Threshold Pulse Width: 0.4 ms
Lead Channel Pacing Threshold Pulse Width: 0.4 ms
Lead Channel Sensing Intrinsic Amplitude: 1 mV
Lead Channel Sensing Intrinsic Amplitude: 1 mV
Lead Channel Sensing Intrinsic Amplitude: 6.25 mV
Lead Channel Sensing Intrinsic Amplitude: 6.25 mV
Lead Channel Setting Pacing Amplitude: 1.5 V
Lead Channel Setting Pacing Amplitude: 2.5 V
Lead Channel Setting Pacing Pulse Width: 0.4 ms
Lead Channel Setting Sensing Sensitivity: 1.2 mV
Zone Setting Status: 755011
Zone Setting Status: 755011

## 2021-12-29 NOTE — Progress Notes (Signed)
Remote pacemaker transmission.   

## 2022-01-14 NOTE — Progress Notes (Signed)
Remote pacemaker transmission.   

## 2022-01-21 DIAGNOSIS — Z6828 Body mass index (BMI) 28.0-28.9, adult: Secondary | ICD-10-CM | POA: Diagnosis not present

## 2022-01-21 DIAGNOSIS — U071 COVID-19: Secondary | ICD-10-CM | POA: Diagnosis not present

## 2022-01-21 DIAGNOSIS — J069 Acute upper respiratory infection, unspecified: Secondary | ICD-10-CM | POA: Diagnosis not present

## 2022-02-14 DIAGNOSIS — K219 Gastro-esophageal reflux disease without esophagitis: Secondary | ICD-10-CM | POA: Diagnosis not present

## 2022-02-14 DIAGNOSIS — I10 Essential (primary) hypertension: Secondary | ICD-10-CM | POA: Diagnosis not present

## 2022-02-14 DIAGNOSIS — I480 Paroxysmal atrial fibrillation: Secondary | ICD-10-CM | POA: Diagnosis not present

## 2022-02-14 DIAGNOSIS — N183 Chronic kidney disease, stage 3 unspecified: Secondary | ICD-10-CM | POA: Diagnosis not present

## 2022-02-14 DIAGNOSIS — E7800 Pure hypercholesterolemia, unspecified: Secondary | ICD-10-CM | POA: Diagnosis not present

## 2022-02-15 DIAGNOSIS — E7800 Pure hypercholesterolemia, unspecified: Secondary | ICD-10-CM | POA: Diagnosis not present

## 2022-02-15 DIAGNOSIS — Z950 Presence of cardiac pacemaker: Secondary | ICD-10-CM | POA: Diagnosis not present

## 2022-02-15 DIAGNOSIS — I480 Paroxysmal atrial fibrillation: Secondary | ICD-10-CM | POA: Diagnosis not present

## 2022-02-15 DIAGNOSIS — Z6828 Body mass index (BMI) 28.0-28.9, adult: Secondary | ICD-10-CM | POA: Diagnosis not present

## 2022-02-15 DIAGNOSIS — R609 Edema, unspecified: Secondary | ICD-10-CM | POA: Diagnosis not present

## 2022-02-15 DIAGNOSIS — Z8719 Personal history of other diseases of the digestive system: Secondary | ICD-10-CM | POA: Diagnosis not present

## 2022-02-15 DIAGNOSIS — N183 Chronic kidney disease, stage 3 unspecified: Secondary | ICD-10-CM | POA: Diagnosis not present

## 2022-02-15 DIAGNOSIS — K219 Gastro-esophageal reflux disease without esophagitis: Secondary | ICD-10-CM | POA: Diagnosis not present

## 2022-02-15 DIAGNOSIS — Z0000 Encounter for general adult medical examination without abnormal findings: Secondary | ICD-10-CM | POA: Diagnosis not present

## 2022-02-15 DIAGNOSIS — D6869 Other thrombophilia: Secondary | ICD-10-CM | POA: Diagnosis not present

## 2022-03-25 ENCOUNTER — Ambulatory Visit: Payer: PPO

## 2022-03-25 DIAGNOSIS — I495 Sick sinus syndrome: Secondary | ICD-10-CM | POA: Diagnosis not present

## 2022-03-25 LAB — CUP PACEART REMOTE DEVICE CHECK
Battery Remaining Longevity: 143 mo
Battery Voltage: 3.02 V
Brady Statistic AP VP Percent: 0.13 %
Brady Statistic AP VS Percent: 85.15 %
Brady Statistic AS VP Percent: 0.01 %
Brady Statistic AS VS Percent: 14.77 %
Brady Statistic RA Percent Paced: 47.58 %
Brady Statistic RV Percent Paced: 9.96 %
Date Time Interrogation Session: 20240308025218
Implantable Lead Connection Status: 753985
Implantable Lead Connection Status: 753985
Implantable Lead Implant Date: 20210910
Implantable Lead Implant Date: 20210910
Implantable Lead Location: 753859
Implantable Lead Location: 753860
Implantable Lead Model: 5076
Implantable Lead Model: 5076
Implantable Pulse Generator Implant Date: 20210910
Lead Channel Impedance Value: 323 Ohm
Lead Channel Impedance Value: 380 Ohm
Lead Channel Impedance Value: 437 Ohm
Lead Channel Impedance Value: 456 Ohm
Lead Channel Pacing Threshold Amplitude: 0.5 V
Lead Channel Pacing Threshold Amplitude: 0.625 V
Lead Channel Pacing Threshold Pulse Width: 0.4 ms
Lead Channel Pacing Threshold Pulse Width: 0.4 ms
Lead Channel Sensing Intrinsic Amplitude: 1 mV
Lead Channel Sensing Intrinsic Amplitude: 1 mV
Lead Channel Sensing Intrinsic Amplitude: 5.25 mV
Lead Channel Sensing Intrinsic Amplitude: 5.25 mV
Lead Channel Setting Pacing Amplitude: 1.5 V
Lead Channel Setting Pacing Amplitude: 2.5 V
Lead Channel Setting Pacing Pulse Width: 0.4 ms
Lead Channel Setting Sensing Sensitivity: 1.2 mV
Zone Setting Status: 755011
Zone Setting Status: 755011

## 2022-04-25 NOTE — Progress Notes (Signed)
Remote pacemaker transmission.   

## 2022-05-17 DIAGNOSIS — I872 Venous insufficiency (chronic) (peripheral): Secondary | ICD-10-CM | POA: Diagnosis not present

## 2022-05-17 DIAGNOSIS — I480 Paroxysmal atrial fibrillation: Secondary | ICD-10-CM | POA: Diagnosis not present

## 2022-05-17 DIAGNOSIS — Z6828 Body mass index (BMI) 28.0-28.9, adult: Secondary | ICD-10-CM | POA: Diagnosis not present

## 2022-06-24 ENCOUNTER — Ambulatory Visit (INDEPENDENT_AMBULATORY_CARE_PROVIDER_SITE_OTHER): Payer: PPO

## 2022-06-24 DIAGNOSIS — I495 Sick sinus syndrome: Secondary | ICD-10-CM | POA: Diagnosis not present

## 2022-06-24 LAB — CUP PACEART REMOTE DEVICE CHECK
Battery Remaining Longevity: 139 mo
Battery Voltage: 3.02 V
Brady Statistic AP VP Percent: 0.07 %
Brady Statistic AP VS Percent: 76.02 %
Brady Statistic AS VP Percent: 0.01 %
Brady Statistic AS VS Percent: 23.94 %
Brady Statistic RA Percent Paced: 46.62 %
Brady Statistic RV Percent Paced: 9 %
Date Time Interrogation Session: 20240607054144
Implantable Lead Connection Status: 753985
Implantable Lead Connection Status: 753985
Implantable Lead Implant Date: 20210910
Implantable Lead Implant Date: 20210910
Implantable Lead Location: 753859
Implantable Lead Location: 753860
Implantable Lead Model: 5076
Implantable Lead Model: 5076
Implantable Pulse Generator Implant Date: 20210910
Lead Channel Impedance Value: 285 Ohm
Lead Channel Impedance Value: 342 Ohm
Lead Channel Impedance Value: 399 Ohm
Lead Channel Impedance Value: 418 Ohm
Lead Channel Pacing Threshold Amplitude: 0.5 V
Lead Channel Pacing Threshold Amplitude: 0.75 V
Lead Channel Pacing Threshold Pulse Width: 0.4 ms
Lead Channel Pacing Threshold Pulse Width: 0.4 ms
Lead Channel Sensing Intrinsic Amplitude: 1 mV
Lead Channel Sensing Intrinsic Amplitude: 1 mV
Lead Channel Sensing Intrinsic Amplitude: 5.125 mV
Lead Channel Sensing Intrinsic Amplitude: 5.125 mV
Lead Channel Setting Pacing Amplitude: 1.5 V
Lead Channel Setting Pacing Amplitude: 2.5 V
Lead Channel Setting Pacing Pulse Width: 0.4 ms
Lead Channel Setting Sensing Sensitivity: 1.2 mV
Zone Setting Status: 755011
Zone Setting Status: 755011

## 2022-07-11 NOTE — Progress Notes (Signed)
Remote pacemaker transmission.   

## 2022-08-22 ENCOUNTER — Emergency Department (HOSPITAL_COMMUNITY): Payer: PPO

## 2022-08-22 ENCOUNTER — Inpatient Hospital Stay (HOSPITAL_COMMUNITY)
Admission: EM | Admit: 2022-08-22 | Discharge: 2022-08-29 | DRG: 312 | Disposition: A | Discharge status: Home Health | Payer: PPO | Attending: Internal Medicine | Admitting: Internal Medicine

## 2022-08-22 DIAGNOSIS — I4891 Unspecified atrial fibrillation: Secondary | ICD-10-CM | POA: Diagnosis not present

## 2022-08-22 DIAGNOSIS — Z8673 Personal history of transient ischemic attack (TIA), and cerebral infarction without residual deficits: Secondary | ICD-10-CM

## 2022-08-22 DIAGNOSIS — E876 Hypokalemia: Secondary | ICD-10-CM | POA: Diagnosis present

## 2022-08-22 DIAGNOSIS — S0033XA Contusion of nose, initial encounter: Secondary | ICD-10-CM | POA: Diagnosis not present

## 2022-08-22 DIAGNOSIS — R1013 Epigastric pain: Secondary | ICD-10-CM | POA: Diagnosis not present

## 2022-08-22 DIAGNOSIS — S3993XA Unspecified injury of pelvis, initial encounter: Secondary | ICD-10-CM | POA: Diagnosis not present

## 2022-08-22 DIAGNOSIS — S6992XA Unspecified injury of left wrist, hand and finger(s), initial encounter: Secondary | ICD-10-CM | POA: Diagnosis not present

## 2022-08-22 DIAGNOSIS — I444 Left anterior fascicular block: Secondary | ICD-10-CM | POA: Diagnosis present

## 2022-08-22 DIAGNOSIS — M79601 Pain in right arm: Secondary | ICD-10-CM | POA: Diagnosis present

## 2022-08-22 DIAGNOSIS — M47817 Spondylosis without myelopathy or radiculopathy, lumbosacral region: Secondary | ICD-10-CM | POA: Diagnosis not present

## 2022-08-22 DIAGNOSIS — M47812 Spondylosis without myelopathy or radiculopathy, cervical region: Secondary | ICD-10-CM | POA: Diagnosis not present

## 2022-08-22 DIAGNOSIS — S0990XA Unspecified injury of head, initial encounter: Secondary | ICD-10-CM | POA: Diagnosis not present

## 2022-08-22 DIAGNOSIS — R55 Syncope and collapse: Principal | ICD-10-CM | POA: Diagnosis present

## 2022-08-22 DIAGNOSIS — N1831 Chronic kidney disease, stage 3a: Secondary | ICD-10-CM | POA: Diagnosis present

## 2022-08-22 DIAGNOSIS — G510 Bell's palsy: Secondary | ICD-10-CM | POA: Diagnosis present

## 2022-08-22 DIAGNOSIS — Z950 Presence of cardiac pacemaker: Secondary | ICD-10-CM

## 2022-08-22 DIAGNOSIS — S61511A Laceration without foreign body of right wrist, initial encounter: Secondary | ICD-10-CM | POA: Diagnosis not present

## 2022-08-22 DIAGNOSIS — I482 Chronic atrial fibrillation, unspecified: Secondary | ICD-10-CM | POA: Diagnosis present

## 2022-08-22 DIAGNOSIS — S3992XA Unspecified injury of lower back, initial encounter: Secondary | ICD-10-CM | POA: Diagnosis not present

## 2022-08-22 DIAGNOSIS — H04123 Dry eye syndrome of bilateral lacrimal glands: Secondary | ICD-10-CM | POA: Diagnosis present

## 2022-08-22 DIAGNOSIS — Z66 Do not resuscitate: Secondary | ICD-10-CM | POA: Diagnosis present

## 2022-08-22 DIAGNOSIS — S3991XA Unspecified injury of abdomen, initial encounter: Secondary | ICD-10-CM | POA: Diagnosis not present

## 2022-08-22 DIAGNOSIS — M1812 Unilateral primary osteoarthritis of first carpometacarpal joint, left hand: Secondary | ICD-10-CM | POA: Diagnosis not present

## 2022-08-22 DIAGNOSIS — E785 Hyperlipidemia, unspecified: Secondary | ICD-10-CM | POA: Diagnosis present

## 2022-08-22 DIAGNOSIS — S20211A Contusion of right front wall of thorax, initial encounter: Secondary | ICD-10-CM | POA: Diagnosis not present

## 2022-08-22 DIAGNOSIS — I495 Sick sinus syndrome: Secondary | ICD-10-CM | POA: Diagnosis present

## 2022-08-22 DIAGNOSIS — N179 Acute kidney failure, unspecified: Secondary | ICD-10-CM | POA: Diagnosis present

## 2022-08-22 DIAGNOSIS — T07XXXA Unspecified multiple injuries, initial encounter: Secondary | ICD-10-CM | POA: Diagnosis not present

## 2022-08-22 DIAGNOSIS — M25551 Pain in right hip: Secondary | ICD-10-CM | POA: Diagnosis not present

## 2022-08-22 DIAGNOSIS — S299XXA Unspecified injury of thorax, initial encounter: Secondary | ICD-10-CM | POA: Diagnosis not present

## 2022-08-22 DIAGNOSIS — M8580 Other specified disorders of bone density and structure, unspecified site: Secondary | ICD-10-CM | POA: Diagnosis not present

## 2022-08-22 DIAGNOSIS — Z23 Encounter for immunization: Secondary | ICD-10-CM

## 2022-08-22 DIAGNOSIS — R000 Tachycardia, unspecified: Secondary | ICD-10-CM | POA: Diagnosis not present

## 2022-08-22 DIAGNOSIS — S20219A Contusion of unspecified front wall of thorax, initial encounter: Secondary | ICD-10-CM | POA: Diagnosis present

## 2022-08-22 DIAGNOSIS — S0993XA Unspecified injury of face, initial encounter: Secondary | ICD-10-CM | POA: Diagnosis not present

## 2022-08-22 DIAGNOSIS — I679 Cerebrovascular disease, unspecified: Secondary | ICD-10-CM | POA: Diagnosis present

## 2022-08-22 DIAGNOSIS — R110 Nausea: Secondary | ICD-10-CM | POA: Diagnosis not present

## 2022-08-22 DIAGNOSIS — Z7901 Long term (current) use of anticoagulants: Secondary | ICD-10-CM

## 2022-08-22 DIAGNOSIS — Y92410 Unspecified street and highway as the place of occurrence of the external cause: Secondary | ICD-10-CM

## 2022-08-22 DIAGNOSIS — M25559 Pain in unspecified hip: Secondary | ICD-10-CM | POA: Diagnosis not present

## 2022-08-22 DIAGNOSIS — R079 Chest pain, unspecified: Secondary | ICD-10-CM | POA: Diagnosis not present

## 2022-08-22 DIAGNOSIS — K219 Gastro-esophageal reflux disease without esophagitis: Secondary | ICD-10-CM | POA: Diagnosis present

## 2022-08-22 DIAGNOSIS — Z888 Allergy status to other drugs, medicaments and biological substances status: Secondary | ICD-10-CM

## 2022-08-22 DIAGNOSIS — I6523 Occlusion and stenosis of bilateral carotid arteries: Secondary | ICD-10-CM | POA: Diagnosis not present

## 2022-08-22 DIAGNOSIS — M25532 Pain in left wrist: Secondary | ICD-10-CM | POA: Diagnosis present

## 2022-08-22 DIAGNOSIS — K449 Diaphragmatic hernia without obstruction or gangrene: Secondary | ICD-10-CM | POA: Diagnosis not present

## 2022-08-22 DIAGNOSIS — S199XXA Unspecified injury of neck, initial encounter: Secondary | ICD-10-CM | POA: Diagnosis not present

## 2022-08-22 DIAGNOSIS — Z79899 Other long term (current) drug therapy: Secondary | ICD-10-CM

## 2022-08-22 DIAGNOSIS — I129 Hypertensive chronic kidney disease with stage 1 through stage 4 chronic kidney disease, or unspecified chronic kidney disease: Secondary | ICD-10-CM | POA: Diagnosis present

## 2022-08-22 DIAGNOSIS — J90 Pleural effusion, not elsewhere classified: Secondary | ICD-10-CM | POA: Diagnosis not present

## 2022-08-22 DIAGNOSIS — R918 Other nonspecific abnormal finding of lung field: Secondary | ICD-10-CM | POA: Diagnosis not present

## 2022-08-22 DIAGNOSIS — M4802 Spinal stenosis, cervical region: Secondary | ICD-10-CM | POA: Diagnosis not present

## 2022-08-22 DIAGNOSIS — Z8249 Family history of ischemic heart disease and other diseases of the circulatory system: Secondary | ICD-10-CM

## 2022-08-22 DIAGNOSIS — Z833 Family history of diabetes mellitus: Secondary | ICD-10-CM

## 2022-08-22 LAB — I-STAT CHEM 8, ED
BUN: 20 mg/dL (ref 8–23)
Calcium, Ion: 1.16 mmol/L (ref 1.15–1.40)
Chloride: 106 mmol/L (ref 98–111)
Creatinine, Ser: 1.4 mg/dL — ABNORMAL HIGH (ref 0.44–1.00)
Glucose, Bld: 169 mg/dL — ABNORMAL HIGH (ref 70–99)
HCT: 41 % (ref 36.0–46.0)
Hemoglobin: 13.9 g/dL (ref 12.0–15.0)
Potassium: 3.5 mmol/L (ref 3.5–5.1)
Sodium: 141 mmol/L (ref 135–145)
TCO2: 23 mmol/L (ref 22–32)

## 2022-08-22 LAB — I-STAT CG4 LACTIC ACID, ED: Lactic Acid, Venous: 2.5 mmol/L (ref 0.5–1.9)

## 2022-08-22 LAB — COMPREHENSIVE METABOLIC PANEL
ALT: 17 U/L (ref 0–44)
AST: 31 U/L (ref 15–41)
Albumin: 3.4 g/dL — ABNORMAL LOW (ref 3.5–5.0)
Alkaline Phosphatase: 113 U/L (ref 38–126)
Anion gap: 10 (ref 5–15)
BUN: 17 mg/dL (ref 8–23)
CO2: 23 mmol/L (ref 22–32)
Calcium: 9.4 mg/dL (ref 8.9–10.3)
Chloride: 107 mmol/L (ref 98–111)
Creatinine, Ser: 1.38 mg/dL — ABNORMAL HIGH (ref 0.44–1.00)
GFR, Estimated: 36 mL/min — ABNORMAL LOW (ref >60)
Glucose, Bld: 160 mg/dL — ABNORMAL HIGH (ref 70–99)
Potassium: 3.3 mmol/L — ABNORMAL LOW (ref 3.5–5.1)
Sodium: 140 mmol/L (ref 135–145)
Total Bilirubin: 0.6 mg/dL (ref 0.3–1.2)
Total Protein: 7 g/dL (ref 6.5–8.1)

## 2022-08-22 LAB — CBC
HCT: 39.8 % (ref 36.0–46.0)
Hemoglobin: 13.2 g/dL (ref 12.0–15.0)
MCH: 29.9 pg (ref 26.0–34.0)
MCHC: 33.2 g/dL (ref 30.0–36.0)
MCV: 90 fL (ref 80.0–100.0)
Platelets: 204 10*3/uL (ref 150–400)
RBC: 4.42 MIL/uL (ref 3.87–5.11)
RDW: 13.5 % (ref 11.5–15.5)
WBC: 9.6 10*3/uL (ref 4.0–10.5)
nRBC: 0 % (ref 0.0–0.2)

## 2022-08-22 LAB — PROTIME-INR
INR: 1.2 (ref 0.8–1.2)
Prothrombin Time: 15.1 seconds (ref 11.4–15.2)

## 2022-08-22 LAB — TROPONIN I (HIGH SENSITIVITY): Troponin I (High Sensitivity): 15 ng/L (ref <18)

## 2022-08-22 LAB — ETHANOL: Alcohol, Ethyl (B): <10 mg/dL (ref <10)

## 2022-08-22 LAB — SAMPLE TO BLOOD BANK

## 2022-08-22 LAB — MAGNESIUM: Magnesium: 1.7 mg/dL (ref 1.7–2.4)

## 2022-08-22 LAB — APTT: aPTT: 31 seconds (ref 24–36)

## 2022-08-22 MED ORDER — PANCRELIPASE (LIP-PROT-AMYL) 12000-38000 UNITS PO CPEP
24000.0000 [IU] | ORAL_CAPSULE | Freq: Three times a day (TID) | ORAL | Status: DC
  Administered 2022-08-23 – 2022-08-29 (×20): 24000 [IU] via ORAL
  Filled 2022-08-22 (×21): qty 2

## 2022-08-22 MED ORDER — IOHEXOL 350 MG/ML SOLN
75.0000 mL | Freq: Once | INTRAVENOUS | Status: AC | PRN
  Administered 2022-08-22: 75 mL via INTRAVENOUS

## 2022-08-22 MED ORDER — ACETAMINOPHEN 650 MG RE SUPP: 650.0000 mg | Freq: Four times a day (QID) | RECTAL | Status: DC | PRN

## 2022-08-22 MED ORDER — MAGNESIUM OXIDE -MG SUPPLEMENT 400 (240 MG) MG PO TABS
400.0000 mg | ORAL_TABLET | Freq: Once | ORAL | Status: AC
  Administered 2022-08-22: 400 mg via ORAL
  Filled 2022-08-22: qty 1

## 2022-08-22 MED ORDER — SODIUM CHLORIDE 0.9% FLUSH
3.0000 mL | Freq: Two times a day (BID) | INTRAVENOUS | Status: DC
  Administered 2022-08-23 – 2022-08-29 (×12): 3 mL via INTRAVENOUS

## 2022-08-22 MED ORDER — DILTIAZEM HCL ER COATED BEADS 180 MG PO CP24
180.0000 mg | ORAL_CAPSULE | Freq: Two times a day (BID) | ORAL | Status: DC
  Administered 2022-08-23 – 2022-08-29 (×13): 180 mg via ORAL
  Filled 2022-08-22 (×13): qty 1

## 2022-08-22 MED ORDER — APIXABAN 2.5 MG PO TABS
2.5000 mg | ORAL_TABLET | Freq: Two times a day (BID) | ORAL | Status: DC
  Administered 2022-08-23 – 2022-08-29 (×13): 2.5 mg via ORAL
  Filled 2022-08-22 (×13): qty 1

## 2022-08-22 MED ORDER — TETANUS-DIPHTH-ACELL PERTUSSIS 5-2.5-18.5 LF-MCG/0.5 IM SUSY
0.5000 mL | PREFILLED_SYRINGE | Freq: Once | INTRAMUSCULAR | Status: AC
  Administered 2022-08-22: 0.5 mL via INTRAMUSCULAR
  Filled 2022-08-22: qty 0.5

## 2022-08-22 MED ORDER — OXYCODONE-ACETAMINOPHEN 5-325 MG PO TABS
1.0000 | ORAL_TABLET | Freq: Once | ORAL | Status: AC
  Administered 2022-08-22: 1 via ORAL
  Filled 2022-08-22: qty 1

## 2022-08-22 MED ORDER — ACETAMINOPHEN 325 MG PO TABS
650.0000 mg | ORAL_TABLET | Freq: Four times a day (QID) | ORAL | Status: DC | PRN
  Administered 2022-08-23 – 2022-08-29 (×6): 650 mg via ORAL
  Filled 2022-08-22 (×6): qty 2

## 2022-08-22 MED ORDER — SODIUM CHLORIDE 0.9 % IV BOLUS
500.0000 mL | Freq: Once | INTRAVENOUS | Status: AC
  Administered 2022-08-22: 500 mL via INTRAVENOUS

## 2022-08-22 MED ORDER — FENTANYL CITRATE PF 50 MCG/ML IJ SOSY
50.0000 ug | PREFILLED_SYRINGE | Freq: Once | INTRAMUSCULAR | Status: AC
  Administered 2022-08-22: 50 ug via INTRAVENOUS
  Filled 2022-08-22: qty 1

## 2022-08-22 MED ORDER — OXYCODONE HCL 5 MG PO TABS
5.0000 mg | ORAL_TABLET | ORAL | Status: DC | PRN
  Administered 2022-08-23 – 2022-08-29 (×17): 5 mg via ORAL
  Filled 2022-08-22 (×17): qty 1

## 2022-08-22 MED ORDER — LACTATED RINGERS IV SOLN: INTRAVENOUS | Status: AC

## 2022-08-22 MED ORDER — CYCLOBENZAPRINE HCL 5 MG PO TABS
5.0000 mg | ORAL_TABLET | Freq: Three times a day (TID) | ORAL | Status: DC | PRN
  Administered 2022-08-26 – 2022-08-29 (×6): 5 mg via ORAL
  Filled 2022-08-22 (×6): qty 1

## 2022-08-22 MED ORDER — ONDANSETRON HCL 4 MG/2ML IJ SOLN
4.0000 mg | Freq: Once | INTRAMUSCULAR | Status: AC
  Administered 2022-08-22: 4 mg via INTRAVENOUS
  Filled 2022-08-22: qty 2

## 2022-08-22 MED ORDER — POTASSIUM CHLORIDE 20 MEQ PO PACK
40.0000 meq | PACK | Freq: Once | ORAL | Status: AC
  Administered 2022-08-22: 40 meq via ORAL
  Filled 2022-08-22: qty 2

## 2022-08-22 MED ORDER — PANTOPRAZOLE SODIUM 40 MG PO TBEC
40.0000 mg | DELAYED_RELEASE_TABLET | Freq: Every day | ORAL | Status: DC
  Administered 2022-08-23 – 2022-08-29 (×7): 40 mg via ORAL
  Filled 2022-08-22 (×7): qty 1

## 2022-08-22 MED ORDER — ONDANSETRON HCL 4 MG/2ML IJ SOLN
4.0000 mg | Freq: Four times a day (QID) | INTRAMUSCULAR | Status: DC | PRN
  Administered 2022-08-24 (×2): 4 mg via INTRAVENOUS
  Filled 2022-08-22 (×2): qty 2

## 2022-08-22 MED ORDER — ONDANSETRON HCL 4 MG PO TABS: 4.0000 mg | ORAL_TABLET | Freq: Four times a day (QID) | ORAL | Status: DC | PRN

## 2022-08-22 MED ORDER — POLYETHYLENE GLYCOL 3350 17 G PO PACK
17.0000 g | PACK | Freq: Every day | ORAL | Status: DC | PRN
  Administered 2022-08-25 – 2022-08-27 (×3): 17 g via ORAL
  Filled 2022-08-22 (×3): qty 1
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
## 2022-08-22 NOTE — ED Provider Notes (Signed)
[place] EMERGENCY DEPARTMENT AT [facility] Provider Note   CSN: [id number] Arrival date & time: 08/22/22  1737     History {Add pertinent medical, surgical, social history, OB history to HPI:1} No chief complaint on file.   Danielle Colon is a 89 y.o. female.  HPI     Home Medications Prior to Admission medications   Medication Sig Start Date End Date Taking? Authorizing Provider  acetaminophen (TYLENOL) 500 MG tablet Take 500 mg by mouth 2 (two) times daily as needed for mild pain.    [provider]  Artificial Tear Ointment (DRY EYES OP) Place 1 application  into both eyes at bedtime.    [provider]  Cholecalciferol (VITAMIN D3) 1000 units CAPS Take 1,000 Units by mouth daily.    [provider]  CREON 24000-76000 units CPEP Take 24,000 Units by mouth with breakfast, with lunch, and with evening meal. 04/23/20   [provider]  diltiazem (CARDIZEM CD) 180 MG 24 hr capsule Take 1 capsule (180 mg total) by mouth 2 (two) times daily. Patient not taking: Reported on 11/29/2021 11/24/21   Duke Salvia, MD  docusate sodium (COLACE) 100 MG capsule Take 1 capsule (100 mg total) by mouth 2 (two) times daily as needed for mild constipation. Patient taking differently: Take 100 mg by mouth at bedtime. 10/29/21 10/29/22  [place], Eric J, DO  ELIQUIS 2.5 MG TABS tablet Take 2.5 mg by mouth 2 (two) times daily. 05/19/20   [provider]  oxyCODONE (ROXICODONE) 5 MG immediate release tablet Take 1 tablet (5 mg total) by mouth every 8 (eight) hours as needed. 12/02/21   Dorcas Carrow, MD  pantoprazole (PROTONIX) 40 MG tablet Take 40 mg by mouth daily before breakfast. 05/01/20   [provider]  losartan (COZAAR) 50 MG tablet Take 1 tablet (50 mg total) by mouth daily. 07/24/19 10/11/19  Glade Lloyd, MD      Allergies    Carafate [sucralfate], Cephalosporins, Tessalon perles [benzonatate], Neurontin [gabapentin], and  Zantac [ranitidine hcl]    Review of Systems   Review of Systems  Physical Exam Updated Vital Signs BP 138/70 (BP Location: Right Arm)   Pulse (!) 113   Temp 98.1 F (36.7 C) (Oral)   Resp 14   SpO2 99%  Physical Exam  ED Results / Procedures / Treatments   Labs (all labs ordered are listed, but only abnormal results are displayed) Labs Reviewed  I-STAT CHEM 8, ED - Abnormal; Notable for the following components:      Result Value   Creatinine, Ser 1.40 (*)    Glucose, Bld 169 (*)    All other components within normal limits  I-STAT CG4 LACTIC ACID, ED - Abnormal; Notable for the following components:   Lactic Acid, Venous 2.5 (*)    All other components within normal limits  COMPREHENSIVE METABOLIC PANEL  CBC  ETHANOL  URINALYSIS, ROUTINE W REFLEX MICROSCOPIC  PROTIME-INR  SAMPLE TO BLOOD BANK    EKG None  Radiology DG Pelvis Portable  Result Date: 08/22/2022 CLINICAL DATA:  Pain after trauma EXAM: PORTABLE PELVIS 1 VIEWS COMPARISON:  None Available. FINDINGS: No acute fracture or dislocation. Preserved joint spaces. Only mild joint space loss along the pubic symphysis. Hyperostosis. No fracture or dislocation. Overlapping artifacts. Vascular calcifications. IMPRESSION: Chronic changes.  No acute osseous abnormality. Electronically Signed   By: Karen Kays M.D.   On: 08/22/2022 18:00   DG Chest [facility] 1 View  Result  Date: 08/22/2022 CLINICAL DATA:  Pain after trauma EXAM: PORTABLE CHEST 1 VIEW COMPARISON:  Chest x-ray 01/04/2021 and older FINDINGS: Normal cardiopericardial silhouette. Left upper chest battery pack with leads along the right side of the heart for pacemaker. Tiny right effusion. Left midlung atelectasis or scar. No consolidation or pneumothorax. No edema. Chronic interstitial changes. Osteopenia and degenerative changes identified. If there is further concern of sequela of injury, CT can be performed as clinically appropriate further sensitivity. IMPRESSION:  Pacemaker. Tiny right effusion.  Left midlung scar or atelectasis. Electronically Signed   By: Karen Kays M.D.   On: 08/22/2022 17:59    Procedures Procedures  {Document cardiac monitor, telemetry assessment procedure when appropriate:1}  Medications Ordered in ED Medications  Tdap (BOOSTRIX) injection 0.5 mL (0.5 mLs Intramuscular Given 08/22/22 1755)  fentaNYL (SUBLIMAZE) injection 50 mcg (50 mcg Intravenous Given 08/22/22 1754)  ondansetron (ZOFRAN) injection 4 mg (4 mg Intravenous Given 08/22/22 1754)  sodium chloride 0.9 % bolus 500 mL (500 mLs Intravenous New Bag/Given 08/22/22 1759)    ED Course/ Medical Decision Making/ A&P   {   Click here for ABCD2, HEART and other calculatorsREFRESH Note before signing :1}                              Medical Decision Making Amount and/or Complexity of Data Reviewed Labs: ordered. Radiology: ordered.  Risk Prescription drug management.   ***  {Document critical care time when appropriate:1} {Document review of labs and clinical decision tools ie heart score, Chads2Vasc2 etc:1}  {Document your independent review of radiology images, and any outside records:1} {Document your discussion with family members, caretakers, and with consultants:1} {Document social determinants of health affecting pt's care:1} {Document your decision making why or why not admission, treatments were needed:1} Final Clinical Impression(s) / ED Diagnoses Final diagnoses:  None    Rx / DC Orders ED Discharge Orders     None
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
## 2022-08-22 NOTE — ED Notes (Signed)
Pt transported to CT on portable monitor with RNs
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
## 2022-08-22 NOTE — ED Notes (Signed)
Bruising right chest wall with tenderness. Left wrist swelling, skin tear, and possible deformity.

## 2022-08-23 ENCOUNTER — Observation Stay (HOSPITAL_COMMUNITY): Payer: PPO

## 2022-08-23 ENCOUNTER — Other Ambulatory Visit: Payer: Self-pay

## 2022-08-23 ENCOUNTER — Encounter (HOSPITAL_COMMUNITY): Payer: Self-pay | Admitting: Family Medicine

## 2022-08-23 DIAGNOSIS — S20219A Contusion of unspecified front wall of thorax, initial encounter: Secondary | ICD-10-CM | POA: Diagnosis not present

## 2022-08-23 DIAGNOSIS — R55 Syncope and collapse: Secondary | ICD-10-CM | POA: Diagnosis not present

## 2022-08-23 DIAGNOSIS — Z950 Presence of cardiac pacemaker: Secondary | ICD-10-CM | POA: Diagnosis not present

## 2022-08-23 DIAGNOSIS — R918 Other nonspecific abnormal finding of lung field: Secondary | ICD-10-CM | POA: Diagnosis not present

## 2022-08-23 DIAGNOSIS — I679 Cerebrovascular disease, unspecified: Secondary | ICD-10-CM | POA: Diagnosis not present

## 2022-08-23 DIAGNOSIS — M85811 Other specified disorders of bone density and structure, right shoulder: Secondary | ICD-10-CM | POA: Diagnosis not present

## 2022-08-23 DIAGNOSIS — J90 Pleural effusion, not elsewhere classified: Secondary | ICD-10-CM | POA: Diagnosis not present

## 2022-08-23 DIAGNOSIS — H04123 Dry eye syndrome of bilateral lacrimal glands: Secondary | ICD-10-CM | POA: Diagnosis not present

## 2022-08-23 DIAGNOSIS — I129 Hypertensive chronic kidney disease with stage 1 through stage 4 chronic kidney disease, or unspecified chronic kidney disease: Secondary | ICD-10-CM | POA: Diagnosis not present

## 2022-08-23 DIAGNOSIS — M19011 Primary osteoarthritis, right shoulder: Secondary | ICD-10-CM | POA: Diagnosis not present

## 2022-08-23 DIAGNOSIS — M25521 Pain in right elbow: Secondary | ICD-10-CM | POA: Diagnosis not present

## 2022-08-23 DIAGNOSIS — E876 Hypokalemia: Secondary | ICD-10-CM | POA: Diagnosis not present

## 2022-08-23 DIAGNOSIS — M79601 Pain in right arm: Secondary | ICD-10-CM | POA: Diagnosis not present

## 2022-08-23 DIAGNOSIS — N179 Acute kidney failure, unspecified: Secondary | ICD-10-CM | POA: Diagnosis not present

## 2022-08-23 DIAGNOSIS — Z7901 Long term (current) use of anticoagulants: Secondary | ICD-10-CM | POA: Diagnosis not present

## 2022-08-23 DIAGNOSIS — R079 Chest pain, unspecified: Secondary | ICD-10-CM | POA: Diagnosis not present

## 2022-08-23 DIAGNOSIS — K219 Gastro-esophageal reflux disease without esophagitis: Secondary | ICD-10-CM | POA: Diagnosis not present

## 2022-08-23 DIAGNOSIS — G510 Bell's palsy: Secondary | ICD-10-CM | POA: Diagnosis not present

## 2022-08-23 DIAGNOSIS — Z79899 Other long term (current) drug therapy: Secondary | ICD-10-CM | POA: Diagnosis not present

## 2022-08-23 DIAGNOSIS — Z833 Family history of diabetes mellitus: Secondary | ICD-10-CM | POA: Diagnosis not present

## 2022-08-23 DIAGNOSIS — Z66 Do not resuscitate: Secondary | ICD-10-CM | POA: Diagnosis not present

## 2022-08-23 DIAGNOSIS — N1831 Chronic kidney disease, stage 3a: Secondary | ICD-10-CM | POA: Diagnosis not present

## 2022-08-23 DIAGNOSIS — I482 Chronic atrial fibrillation, unspecified: Secondary | ICD-10-CM | POA: Diagnosis not present

## 2022-08-23 DIAGNOSIS — J984 Other disorders of lung: Secondary | ICD-10-CM | POA: Diagnosis not present

## 2022-08-23 DIAGNOSIS — E785 Hyperlipidemia, unspecified: Secondary | ICD-10-CM | POA: Diagnosis not present

## 2022-08-23 DIAGNOSIS — Y92410 Unspecified street and highway as the place of occurrence of the external cause: Secondary | ICD-10-CM | POA: Diagnosis not present

## 2022-08-23 DIAGNOSIS — Z888 Allergy status to other drugs, medicaments and biological substances status: Secondary | ICD-10-CM | POA: Diagnosis not present

## 2022-08-23 DIAGNOSIS — Z8249 Family history of ischemic heart disease and other diseases of the circulatory system: Secondary | ICD-10-CM | POA: Diagnosis not present

## 2022-08-23 DIAGNOSIS — Z041 Encounter for examination and observation following transport accident: Secondary | ICD-10-CM | POA: Diagnosis not present

## 2022-08-23 DIAGNOSIS — Z23 Encounter for immunization: Secondary | ICD-10-CM | POA: Diagnosis not present

## 2022-08-23 DIAGNOSIS — M779 Enthesopathy, unspecified: Secondary | ICD-10-CM | POA: Diagnosis not present

## 2022-08-23 DIAGNOSIS — Z8673 Personal history of transient ischemic attack (TIA), and cerebral infarction without residual deficits: Secondary | ICD-10-CM | POA: Diagnosis not present

## 2022-08-23 DIAGNOSIS — I444 Left anterior fascicular block: Secondary | ICD-10-CM | POA: Diagnosis not present

## 2022-08-23 DIAGNOSIS — I495 Sick sinus syndrome: Secondary | ICD-10-CM | POA: Diagnosis not present

## 2022-08-23 LAB — CBG MONITORING, ED: Glucose-Capillary: 98 mg/dL (ref 70–99)

## 2022-08-23 MED ORDER — SODIUM CHLORIDE 0.9 % IV SOLN: INTRAVENOUS | Status: DC

## 2022-08-23 MED ORDER — ORAL CARE MOUTH RINSE: 15.0000 mL | OROMUCOSAL | Status: DC | PRN

## 2022-08-23 MED ORDER — ENSURE ENLIVE PO LIQD
237.0000 mL | Freq: Two times a day (BID) | ORAL | Status: DC
  Administered 2022-08-23 – 2022-08-29 (×12): 237 mL via ORAL

## 2022-08-23 NOTE — ED Notes (Signed)
Patient noted to have wet brief at this time. Cleansed and changed brief at this time. Provided with clean chux pad.
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
## 2022-08-23 NOTE — ED Notes (Signed)
Unable to perform orthostatic VS , patient unable to sit and stand at this time .

## 2022-08-23 NOTE — TOC CAGE-AID Note (Signed)
Transition of Care [facility]) - CAGE-AID Screening   Patient Details  Name: Danielle Colon MRN: [id number] Date of Birth: [birth year]  Transition of Care [facility]) CM/SW Contact:    Katha Hamming, RN Phone Number: 08/23/2022, 12:47 AM   CAGE-AID Screening:    Have You Ever Felt You Ought to Cut Down on Your Drinking or Drug Use?: No Have People Annoyed You By [employer] Your Drinking Or Drug Use?: No Have You Felt Bad Or Guilty About Your Drinking Or Drug Use?: No Have You Ever Had a Drink or Used Drugs First Thing In The Morning to Steady Your Nerves or to Get Rid of a Hangover?: No CAGE-AID Score: 0  Substance Abuse Education Offered: No

## 2022-08-23 NOTE — ED Notes (Signed)
Patient transported to X-ray 
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
## 2022-08-23 NOTE — ED Notes (Signed)
Hospitalist team at bedside.
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
## 2022-08-23 NOTE — ED Notes (Signed)
Urinary incontinence care provided , adult brief applied , repositioned for comfort.

## 2022-08-23 NOTE — Progress Notes (Signed)
Echocardiogram 2D Echocardiogram has been performed.  Irish Lack, RDCS 08/23/2022, 1:32 PM

## 2022-08-24 DIAGNOSIS — R55 Syncope and collapse: Secondary | ICD-10-CM | POA: Diagnosis not present

## 2022-08-24 LAB — CBC WITH DIFFERENTIAL/PLATELET
Abs Immature Granulocytes: 0.02 10*3/uL (ref 0.00–0.07)
Basophils Absolute: 0 10*3/uL (ref 0.0–0.1)
Basophils Relative: 1 %
Eosinophils Absolute: 0.1 10*3/uL (ref 0.0–0.5)
Eosinophils Relative: 1 %
HCT: 34.7 % — ABNORMAL LOW (ref 36.0–46.0)
Hemoglobin: 11.7 g/dL — ABNORMAL LOW (ref 12.0–15.0)
Immature Granulocytes: 0 %
Lymphocytes Relative: 14 %
Lymphs Abs: 0.9 10*3/uL (ref 0.7–4.0)
MCH: 30.2 pg (ref 26.0–34.0)
MCHC: 33.7 g/dL (ref 30.0–36.0)
MCV: 89.4 fL (ref 80.0–100.0)
Monocytes Absolute: 0.7 10*3/uL (ref 0.1–1.0)
Monocytes Relative: 11 %
Neutro Abs: 4.6 10*3/uL (ref 1.7–7.7)
Neutrophils Relative %: 73 %
Platelets: 154 10*3/uL (ref 150–400)
RBC: 3.88 MIL/uL (ref 3.87–5.11)
RDW: 13.5 % (ref 11.5–15.5)
WBC: 6.3 10*3/uL (ref 4.0–10.5)
nRBC: 0 % (ref 0.0–0.2)

## 2022-08-24 LAB — BASIC METABOLIC PANEL
Anion gap: 13 (ref 5–15)
BUN: 11 mg/dL (ref 8–23)
CO2: 24 mmol/L (ref 22–32)
Calcium: 8.8 mg/dL — ABNORMAL LOW (ref 8.9–10.3)
Chloride: 100 mmol/L (ref 98–111)
Creatinine, Ser: 0.98 mg/dL (ref 0.44–1.00)
GFR, Estimated: 55 mL/min — ABNORMAL LOW (ref >60)
Glucose, Bld: 123 mg/dL — ABNORMAL HIGH (ref 70–99)
Potassium: 4 mmol/L (ref 3.5–5.1)
Sodium: 137 mmol/L (ref 135–145)

## 2022-08-24 LAB — GLUCOSE, CAPILLARY: Glucose-Capillary: 98 mg/dL (ref 70–99)

## 2022-08-24 MED ORDER — ADULT MULTIVITAMIN W/MINERALS CH
1.0000 | ORAL_TABLET | Freq: Every day | ORAL | Status: DC
  Administered 2022-08-24 – 2022-08-29 (×6): 1 via ORAL
  Filled 2022-08-24 (×6): qty 1
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
## 2022-08-24 NOTE — Plan of Care (Signed)
  Problem: Education: Goal: Knowledge of condition and prescribed therapy will improve Outcome: Progressing   Problem: Education: Goal: Knowledge of General Education information will improve Description: Including pain rating scale, medication(s)/side effects and non-pharmacologic comfort measures Outcome: Progressing   Problem: Health Behavior/Discharge Planning: Goal: Ability to manage health-related needs will improve Outcome: Progressing   Problem: Activity: Goal: Risk for activity intolerance will decrease Outcome: Progressing
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
## 2022-08-24 NOTE — Progress Notes (Signed)
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 abnormality. Electronically Signed  By: Tish Frederickson M.D.   On: 08/22/2022 18:35   DG Pelvis Portable  Result Date: 08/22/2022 CLINICAL DATA:  Pain after trauma EXAM: PORTABLE PELVIS 1 VIEWS COMPARISON:  None Available. FINDINGS: No acute fracture or dislocation. Preserved joint spaces. Only mild joint space loss along the pubic symphysis. Hyperostosis. No fracture or dislocation. Overlapping artifacts. Vascular calcifications. IMPRESSION: Chronic changes.  No acute osseous abnormality. Electronically Signed   By: Karen Kays M.D.   On: 08/22/2022 18:00   DG Chest Port 1 View  Result Date: 08/22/2022 CLINICAL DATA:  Pain after trauma EXAM: PORTABLE CHEST 1 VIEW COMPARISON:  Chest x-ray 01/04/2021 and older FINDINGS: Normal cardiopericardial silhouette. Left upper chest battery pack with leads along the right side of the heart for pacemaker. Tiny right effusion. Left midlung atelectasis or scar. No consolidation or pneumothorax. No edema. Chronic interstitial changes. Osteopenia and degenerative changes identified. If there is further concern of sequela of injury, CT can be performed as clinically  appropriate further sensitivity. IMPRESSION: Pacemaker. Tiny right effusion.  Left midlung scar or atelectasis. Electronically Signed   By: Karen Kays M.D.   On: 08/22/2022 17:59    Scheduled Meds:  apixaban  2.5 mg Oral BID   diltiazem  180 mg Oral BID   feeding supplement  237 mL Oral BID BM   lipase/protease/amylase  24,000 Units Oral TID with meals   pantoprazole  40 mg Oral QAC breakfast   sodium chloride flush  3 mL Intravenous Q12H   Continuous Infusions:  sodium chloride 75 mL/hr at 08/24/22 0811     LOS: 1 day   Hughie Closs, MD [facility]  08/24/2022, 11:02 AM   *Please note that this is a verbal dictation therefore any spelling or grammatical errors are due to the "Dragon Medical One" system interpretation.  Please page via Amion and do not message via secure chat for urgent patient care matters. Secure chat can be used for non urgent patient care matters.  How to contact the [facility] Attending or Consulting provider 7A - 7P or covering provider during after hours 7P -7A, for this patient?  Check the care team in [facility] and look for a) attending/consulting TR[facility] provider listed and b) the [facility] team listed. Page or secure chat 7A-7P. Log into [email or web address] and use [place]'s universal password to access. If you do not have the password, please contact the hospital operator. Locate the [facility] provider you are looking for under [facility] and page to a number that you can be directly reached. If you still have difficulty reaching the provider, please page the [facility] (Director on Call) for the Hospitalists listed on amion for assistance.

## 2022-08-25 ENCOUNTER — Inpatient Hospital Stay (HOSPITAL_COMMUNITY): Payer: PPO

## 2022-08-25 DIAGNOSIS — R55 Syncope and collapse: Secondary | ICD-10-CM | POA: Diagnosis not present

## 2022-08-25 LAB — COMPREHENSIVE METABOLIC PANEL
ALT: 84 U/L — ABNORMAL HIGH (ref 0–44)
AST: 93 U/L — ABNORMAL HIGH (ref 15–41)
Albumin: 2.9 g/dL — ABNORMAL LOW (ref 3.5–5.0)
Alkaline Phosphatase: 179 U/L — ABNORMAL HIGH (ref 38–126)
Anion gap: 7 (ref 5–15)
BUN: 16 mg/dL (ref 8–23)
CO2: 27 mmol/L (ref 22–32)
Calcium: 9.1 mg/dL (ref 8.9–10.3)
Chloride: 101 mmol/L (ref 98–111)
Creatinine, Ser: 1 mg/dL (ref 0.44–1.00)
GFR, Estimated: 54 mL/min — ABNORMAL LOW (ref >60)
Glucose, Bld: 143 mg/dL — ABNORMAL HIGH (ref 70–99)
Potassium: 4 mmol/L (ref 3.5–5.1)
Sodium: 135 mmol/L (ref 135–145)
Total Bilirubin: 0.6 mg/dL (ref 0.3–1.2)
Total Protein: 6.5 g/dL (ref 6.5–8.1)

## 2022-08-25 LAB — MAGNESIUM: Magnesium: 1.8 mg/dL (ref 1.7–2.4)

## 2022-08-25 LAB — CBC WITH DIFFERENTIAL/PLATELET
Abs Immature Granulocytes: 0.02 10*3/uL (ref 0.00–0.07)
Basophils Absolute: 0 10*3/uL (ref 0.0–0.1)
Basophils Relative: 0 %
Eosinophils Absolute: 0 10*3/uL (ref 0.0–0.5)
Eosinophils Relative: 0 %
HCT: 39 % (ref 36.0–46.0)
Hemoglobin: 13.3 g/dL (ref 12.0–15.0)
Immature Granulocytes: 0 %
Lymphocytes Relative: 7 %
Lymphs Abs: 0.7 10*3/uL (ref 0.7–4.0)
MCH: 30.4 pg (ref 26.0–34.0)
MCHC: 34.1 g/dL (ref 30.0–36.0)
MCV: 89.2 fL (ref 80.0–100.0)
Monocytes Absolute: 0.7 10*3/uL (ref 0.1–1.0)
Monocytes Relative: 7 %
Neutro Abs: 8.6 10*3/uL — ABNORMAL HIGH (ref 1.7–7.7)
Neutrophils Relative %: 86 %
Platelets: 147 10*3/uL — ABNORMAL LOW (ref 150–400)
RBC: 4.37 MIL/uL (ref 3.87–5.11)
RDW: 13.2 % (ref 11.5–15.5)
WBC: 10.1 10*3/uL (ref 4.0–10.5)
nRBC: 0 % (ref 0.0–0.2)

## 2022-08-25 LAB — LIPASE, BLOOD: Lipase: 27 U/L (ref 11–51)

## 2022-08-25 LAB — GLUCOSE, CAPILLARY: Glucose-Capillary: 133 mg/dL — ABNORMAL HIGH (ref 70–99)

## 2022-08-25 MED ORDER — NALOXONE HCL 0.4 MG/ML IJ SOLN: 0.4000 mg | INTRAMUSCULAR | Status: DC | PRN

## 2022-08-25 MED ORDER — METOPROLOL TARTRATE 5 MG/5ML IV SOLN: 5.0000 mg | INTRAVENOUS | Status: DC | PRN

## 2022-08-25 MED ORDER — ALUM & MAG HYDROXIDE-SIMETH 200-200-20 MG/5ML PO SUSP
30.0000 mL | Freq: Once | ORAL | Status: AC
  Administered 2022-08-25: 30 mL via ORAL
  Filled 2022-08-25: qty 30

## 2022-08-25 MED ORDER — FENTANYL CITRATE PF 50 MCG/ML IJ SOSY
25.0000 ug | PREFILLED_SYRINGE | INTRAMUSCULAR | Status: DC | PRN
  Administered 2022-08-25: 25 ug via INTRAVENOUS
  Filled 2022-08-25: qty 1

## 2022-08-25 MED ORDER — PANTOPRAZOLE SODIUM 40 MG IV SOLR
40.0000 mg | Freq: Once | INTRAVENOUS | Status: AC
  Administered 2022-08-25: 40 mg via INTRAVENOUS
  Filled 2022-08-25: qty 10

## 2022-08-25 MED ORDER — HYDROMORPHONE HCL 1 MG/ML IJ SOLN
0.5000 mg | INTRAMUSCULAR | Status: DC | PRN
  Administered 2022-08-25 – 2022-08-28 (×6): 0.5 mg via INTRAVENOUS
  Filled 2022-08-25 (×6): qty 0.5

## 2022-08-25 MED ORDER — OXYCODONE HCL 5 MG PO TABS: 5.0000 mg | ORAL_TABLET | Freq: Three times a day (TID) | ORAL | 0 refills | Status: DC | PRN

## 2022-08-25 MED ORDER — CYCLOBENZAPRINE HCL 5 MG PO TABS: 5.0000 mg | ORAL_TABLET | Freq: Three times a day (TID) | ORAL | 0 refills | Status: DC | PRN

## 2022-08-25 NOTE — Progress Notes (Signed)
New orders received and implemented. Pt. Resting in bed. No distress noted. HR 120s. Daughter at bedside. Awaiting CXR.

## 2022-08-25 NOTE — [place] FL2 (Signed)
[place] MEDICAID FL2 LEVEL OF CARE FORM     IDENTIFICATION  Patient Name: Danielle Colon Birthdate: [birth year] Sex: female Admission Date (Current Location): 08/22/2022  [facility] and [place] Number:  Producer, television/film/video and Address:  [facility], [address], [place], [place] [address]      Provider Number: [id number]  Attending Physician Name and Address:  Hughie Closs, MD  Relative Name and Phone Number:       Current Level of Care: Hospital Recommended Level of Care: Skilled Nursing Facility Prior Approval Number:    Date Approved/Denied:   PASRR Number: [id number]  Discharge Plan: SNF    Current Diagnoses: Patient Active Problem List   Diagnosis Date Noted   Acute on chronic pancreatitis (HCC) 11/29/2021   DNR (do not resuscitate) 11/29/2021   AKI (acute kidney injury) (HCC) 10/27/2021   Hyperglycemia 10/27/2021   Vertigo 12/08/2020   Hypercalcemia 12/08/2020   SSS (sick sinus syndrome) (HCC) 12/08/2020   Stage 3a chronic kidney disease (CKD) (HCC) 12/08/2020   Abnormal TSH 12/08/2020   Acute pancreatitis 05/22/2020   Sinus bradycardia 03/31/2020   Pacemaker 01/05/2020   Atrial flutter with rapid ventricular response (HCC) 01/05/2020   Syncope 07/23/2019   Chronic atrial fibrillation (HCC) 07/22/2019   Atrial fibrillation with RVR (HCC) 07/22/2019   Syncope and collapse 07/22/2019   Hypokalemia 03/01/2019   Anemia 03/01/2019   Pancreatitis 02/28/2019   Hyperlipidemia 02/28/2019   Obesity (BMI 30.0-34.9) 02/28/2019   Hypoxia 02/28/2019   GERD (gastroesophageal reflux disease) 07/19/2013   Urinary incontinence 07/19/2013   Lung nodule seen on imaging study 07/19/2013   HTN (hypertension) 07/18/2013   History of Bell's palsy 06/27/2013    Orientation RESPIRATION BLADDER Height & Weight     Self, Time, Situation, Place  O2 (1 liter) Continent, External catheter Weight: 150 lb 9.2 oz (68.3 kg) Height:  5\' 2"  (157.5 cm)   BEHAVIORAL SYMPTOMS/MOOD NEUROLOGICAL BOWEL NUTRITION STATUS      Continent Diet (See dc summary)  AMBULATORY STATUS COMMUNICATION OF NEEDS Skin   Limited Assist Verbally Normal                       Personal Care Assistance Level of Assistance  Bathing, Feeding, Dressing Bathing Assistance: Limited assistance Feeding assistance: Independent Dressing Assistance: Limited assistance     Functional Limitations Info  Sight, Hearing, Speech Sight Info: Impaired Hearing Info: Adequate Speech Info: Adequate    SPECIAL CARE FACTORS FREQUENCY  PT (By licensed PT), OT (By licensed OT)     PT Frequency: 5xweek OT Frequency: 5xweek            Contractures Contractures Info: Not present    Additional Factors Info  Code Status, Allergies Code Status Info: DNR Allergies Info: Carafate (Sucralfate)  Cephalosporins  Tessalon Perles (Benzonatate)  Neurontin (Gabapentin)  Zantac (Ranitidine Hcl)           Current Medications (08/25/2022):  This is the current hospital active medication list Current Facility-Administered Medications  Medication Dose Route Frequency Provider Last Rate Last Admin   acetaminophen (TYLENOL) tablet 650 mg  650 mg Oral Q6H PRN Charlsie Quest, MD   650 mg at 08/24/22 1122   Or   acetaminophen (TYLENOL) suppository 650 mg  650 mg Rectal Q6H PRN Charlsie Quest, MD       apixaban (ELIQUIS) tablet 2.5 mg  2.5 mg Oral BID Charlsie Quest, MD   2.5 mg  at 08/25/22 0813   cyclobenzaprine (FLEXERIL) tablet 5 mg  5 mg Oral TID PRN Charlsie Quest, MD       diltiazem (CARDIZEM CD) 24 hr capsule 180 mg  180 mg Oral BID Charlsie Quest, MD   180 mg at 08/25/22 0813   feeding supplement (ENSURE ENLIVE / ENSURE PLUS) liquid 237 mL  237 mL Oral BID BM Hughie Closs, MD   237 mL at 08/25/22 1030   HYDROmorphone (DILAUDID) injection 0.5 mg  0.5 mg Intravenous Q2H PRN Howerter, Justin B, DO   0.5 mg at 08/25/22 0214   lipase/protease/amylase (CREON) capsule 24,000 Units   24,000 Units Oral TID with meals Charlsie Quest, MD   24,000 Units at 08/25/22 0813   metoprolol tartrate (LOPRESSOR) injection 5 mg  5 mg Intravenous Q4H PRN Howerter, Justin B, DO       multivitamin with minerals tablet 1 tablet  1 tablet Oral Daily Hughie Closs, MD   1 tablet at 08/25/22 0813   naloxone [facility]) injection 0.4 mg  0.4 mg Intravenous PRN Howerter, Justin B, DO       ondansetron (ZOFRAN) tablet 4 mg  4 mg Oral Q6H PRN Charlsie Quest, MD       Or   ondansetron (ZOFRAN) injection 4 mg  4 mg Intravenous Q6H PRN Charlsie Quest, MD   4 mg at 08/24/22 2145   Oral care mouth rinse  15 mL Mouth Rinse PRN Hughie Closs, MD       oxyCODONE (Oxy IR/ROXICODONE) immediate release tablet 5 mg  5 mg Oral Q4H PRN Darreld Mclean R, MD   5 mg at 08/25/22 0813   pantoprazole (PROTONIX) EC tablet 40 mg  40 mg Oral QAC breakfast Darreld Mclean R, MD   40 mg at 08/25/22 0813   polyethylene glycol (MIRALAX / GLYCOLAX) packet 17 g  17 g Oral Daily PRN Darreld Mclean R, MD       sodium chloride flush (NS) 0.9 % injection 3 mL  3 mL Intravenous Q12H Charlsie Quest, MD   3 mL at 08/25/22 0818     Discharge Medications: Please see discharge summary for a list of discharge medications.  Relevant Imaging Results:  Relevant Lab Results:   Additional Information SSN: [id number]  Oletta Lamas, MSW, [facility]  Clinical Social Worker I

## 2022-08-25 NOTE — Progress Notes (Signed)
Pt. Resting in bed. Respirations even and unlabored. HR 90s. No distress noted.

## 2022-08-25 NOTE — Progress Notes (Signed)
Pt. c/o 10/10 pain in her chest. Pt. points to epigastric area and reported nausea earlier during the shift. Zofran administered. HR 110s-140s. EKG completed, Pt. placed on 2L O2 via [place], PRN pain med given. On call for [facility] paged to make aware.
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
## 2022-08-25 NOTE — Progress Notes (Signed)
Pt. Reports pain in epigastric area has come back. Pt. Requesting to see doctor. Daughter at bedside. On call for [facility] paged to make aware.

## 2022-08-25 NOTE — Discharge Instructions (Signed)
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
## 2022-08-25 NOTE — Progress Notes (Addendum)
TRH night cross cover note:   I was notified by RN that the patient is complaining of epigastric pain, and experienced some nausea earlier in the shift, which improved following disc prn Zofran.    In the context of history of chronic atrial fibrillation complicated by sick sinus syndrome status post pacemaker placement, her heart rates are currently in the range of the 110s to 140s bpm.  Additional vital signs appear stable, including afebrile, blood pressure 130/83, respiratory rate 20, oxygen saturation 95% on room air.  She has received a dose of oxycodone 5 mg from an existing prn order, without significant ensuing improvement in the intensity of the associated pain thus far.  At this time, no report of any additional associated symptoms.  EKG performed, and shows atrial fibrillation with RVR, with heart rate 117, and no evidence of T wave or ST changes, including evidence of ST elevation.  I also ordered chest x-ray.  It appears that she is on daily Protonix, I have ordered a one-time dose of 40 mg of IV Protonix x 1 now.  In the context of her history of chronic atrial fibrillation, she remains on her home diltiazem 180 mg p.o. twice daily as well as her chronic anticoagulation via Eliquis.  I have added as needed IV Lopressor for sustained heart rates greater than 130 bpm, and of also ordered morning labs in the form of CMP, CBC, and serum magnesium level.   Update: RN has provided the following updates: Heart rate now improved, with ensuing sustained heart rates now less than 130 in the setting of her chronic atrial fibrillation.  Patient continues to complain of epigastric discomfort after aforementioned prn oxycodone and as needed IV fentanyl as well as to dose of IV Protonix.  Chest x-ray result currently pending.  I subsequently escalated the patient's prn IV fentanyl to prn IV Dilaudid, discontinuing existing order for prn IV fentanyl in the process and added a one-time order for GI  cocktail for her epigastric discomfort.  I also added a lipase level to morning labs.    Update: I evaluated/examined the patient at bedside and also discussed the patient's case with her daughter, who is present at bedside.  Upon initially entering the room, the patient was asleep and appeared comfortable without any acute distress. on exam, no evidence of acute peritoneal signs.  The patient reports significant improvement in her epigastric pain after administration of GI cocktail as well as prn IV Dilaudid, which were administered in close proximity to each other.     Newton Pigg, DO Hospitalist

## 2022-08-26 DIAGNOSIS — R55 Syncope and collapse: Secondary | ICD-10-CM | POA: Diagnosis not present

## 2022-08-26 LAB — GLUCOSE, CAPILLARY: Glucose-Capillary: 100 mg/dL — ABNORMAL HIGH (ref 70–99)

## 2022-08-26 MED ORDER — LIDOCAINE 5 % EX PTCH
1.0000 | MEDICATED_PATCH | CUTANEOUS | Status: DC
  Administered 2022-08-26 – 2022-08-28 (×3): 1 via TRANSDERMAL
  Filled 2022-08-26 (×3): qty 1

## 2022-08-26 MED ORDER — LIDOCAINE 5 % EX PTCH: 1.0000 | MEDICATED_PATCH | CUTANEOUS | 0 refills | Status: DC

## 2022-08-26 NOTE — TOC Progression Note (Signed)
Transition of Care [facility]) - Progression Note    Patient Details  Name: VONDELL TROTTIER MRN: [id number] Date of Birth: 1931-01-10  Transition of Care [facility]) CM/SW Contact  Leone Haven, RN Phone Number: 08/26/2022, 3:52 PM  Clinical Narrative:    NCM notified by CSW that patient wants to go home with [facility] and would like to be set up with a HH SW to help with SNF.  NCM offered choice, she states ok with [facility].  NCM also spoke with daughter and she was also ok with [facility].  NCM made referral to [facility]  for HHPT, HHOT, HHAIDE and SW he is able to take referral.  Soc will begin 24 to 48 hrs post dc.  Daughter will transport patient home.   Expected Discharge Plan: Home w Home Health Services Barriers to Discharge: No Barriers Identified  Expected Discharge Plan and Services In-house Referral: NA Discharge Planning Services: CM Consult Post Acute Care Choice: Home Health Living arrangements for the past 2 months: Single Family Home                 DME Arranged: N/A DME Agency: NA       HH Arranged: PT, OT, Nurse's Aide, Social Work [employer] Agency: [facility] Date HH Agency Contacted: 08/26/22 Time HH Agency Contacted: 1552 Representative spoke with at [facility] Agency: Kandee Keen   Social Determinants of Health (SDOH) Interventions SDOH Screenings   Food Insecurity: No Food Insecurity (08/23/2022)  Housing: Low Risk  (08/23/2022)  Transportation Needs: No Transportation Needs (08/23/2022)  Utilities: Not At Risk (08/23/2022)  Tobacco Use: Low Risk  (08/23/2022)    Readmission Risk Interventions    12/01/2021    8:27 AM 10/29/2021    9:25 AM 10/28/2021    9:11 AM  Readmission Risk Prevention Plan  Post Dischage Appt Complete Complete Complete  Medication Screening Complete Complete Complete  Transportation Screening Complete Complete Complete
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
## 2022-08-26 NOTE — Care Management Important Message (Signed)
Important Message  Patient Details  Name: Danielle Colon MRN: [id number] Date of Birth: 11/29/1930   Medicare Important Message Given:  Yes     Renie Ora 08/26/2022, 8:48 AM

## 2022-08-26 NOTE — Progress Notes (Signed)
Mobility Specialist: Progress Note   08/26/22 1549  Mobility  Activity Transferred from chair to bed  Level of Assistance Contact guard assist, steadying assist  Assistive Device Front wheel walker  Activity Response Tolerated well  Mobility Referral Yes  $Mobility charge 1 Mobility  Mobility Specialist Start Time (ACUTE ONLY) 1534  Mobility Specialist Stop Time (ACUTE ONLY) 1544  Mobility Specialist Time Calculation (min) (ACUTE ONLY) 10 min    Pt was agreeable to mobility session - received in chair. SV for STS, minG for transfer. Refused gait belt d/t c/o shoulder and back pain. MinA for bed mobility to move BLE back onto bed. Left in bed with all needs met, RN in room. Call bell in reach.   Maurene Capes Mobility Specialist Please contact via SecureChat or Rehab office at [phone]

## 2022-08-26 NOTE — TOC Progression Note (Addendum)
Transition of Care [facility]) - Progression Note    Patient Details  Name: Danielle Colon MRN: [id number] Date of Birth: 08-23-1930  Transition of Care [facility]) CM/SW Contact  Leander Rams, LCSW Phone Number: 08/26/2022, 3:33 PM  Clinical Narrative:    CSW spoke with pt daughter [name]. [name] requested various referrals be faxed out for SNF. CSW faxed out referrals and the did not accept. Daughter expresses she doesn't want pt to dc to the SNF's that have offered. Daughter reports she would like to take her mom home and set up [facility] services. CSW informed CM of HH services request.   TOC will continue to follow.    Expected Discharge Plan: Skilled Nursing Facility Barriers to Discharge: Continued Medical Work up  Expected Discharge Plan and Services       Living arrangements for the past 2 months: Single Family Home                                       Social Determinants of Health (SDOH) Interventions SDOH Screenings   Food Insecurity: No Food Insecurity (08/23/2022)  Housing: Low Risk  (08/23/2022)  Transportation Needs: No Transportation Needs (08/23/2022)  Utilities: Not At Risk (08/23/2022)  Tobacco Use: Low Risk  (08/23/2022)    Readmission Risk Interventions    12/01/2021    8:27 AM 10/29/2021    9:25 AM 10/28/2021    9:11 AM  Readmission Risk Prevention Plan  Post Dischage Appt Complete Complete Complete  Medication Screening Complete Complete Complete  Transportation Screening Complete Complete Complete    , MSW, LCSWA, LCASA Transitions of Care  Clinical Social Worker I

## 2022-08-26 NOTE — Plan of Care (Signed)
  Problem: Pain Managment: Goal: General experience of comfort will improve Outcome: Progressing   Problem: Safety: Goal: Ability to remain free from injury will improve Outcome: Progressing   Problem: Skin Integrity: Goal: Risk for impaired skin integrity will decrease Outcome: Progressing   
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
## 2022-08-27 DIAGNOSIS — R55 Syncope and collapse: Secondary | ICD-10-CM | POA: Diagnosis not present
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
## 2022-08-28 DIAGNOSIS — R55 Syncope and collapse: Secondary | ICD-10-CM | POA: Diagnosis not present

## 2022-08-28 NOTE — Progress Notes (Signed)
Patient still having increase pain and discomfort with moving and repositioning in bed  She states her pain is 7/10 on pain scale I will give her some pain meds and reassess for decrease in levels She has gotten out of bed and walked around the room and sat in the recliner briefly in order to change linens.  She stood at the bedside and allowed me to give her a bath, and I also replaced the external catheter, changed the suction canister, and placed mesh underwear in order to secure the device Patient has some urine on the linen due to some movement in bed and leakage.  I will continue to monitor for changes in status and will note changes accordingly
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
## 2022-08-29 DIAGNOSIS — R55 Syncope and collapse: Secondary | ICD-10-CM | POA: Diagnosis not present

## 2022-08-29 LAB — GLUCOSE, CAPILLARY: Glucose-Capillary: 96 mg/dL (ref 70–99)

## 2022-08-29 NOTE — TOC Transition Note (Addendum)
Transition of Care [facility]) - CM/SW Discharge Note   Patient Details  Name: Danielle Colon MRN: [id number] Date of Birth: 04-03-1930  Transition of Care [facility]) CM/SW Contact:  Leone Haven, RN Phone Number: 08/29/2022, 1:57 PM   Clinical Narrative:    Patient is for dc today, NCM notified Kandee Keen with [facility] of the dc.  Daughter will transport her home. Per CSW she spoke with daughter and she plans to take patient home with [facility].   Final next level of care: Home w Home Health Services Barriers to Discharge: No Barriers Identified   Patient Goals and CMS Choice CMS Medicare.gov Compare Post Acute Care list provided to:: Patient Choice offered to / list presented to : Patient  Discharge Placement                         Discharge Plan and Services Additional resources added to the After Visit Summary for   In-house Referral: NA Discharge Planning Services: CM Consult Post Acute Care Choice: Home Health          DME Arranged: N/A DME Agency: NA       HH Arranged: PT, OT, Nurse's Aide, Social Work [employer] Agency: [facility] Date [facility] Agency Contacted: 08/26/22 Time HH Agency Contacted: 1552 Representative spoke with at [facility] Agency: Kandee Keen  Social Determinants of Health (SDOH) Interventions SDOH Screenings   Food Insecurity: No Food Insecurity (08/23/2022)  Housing: Low Risk  (08/23/2022)  Transportation Needs: No Transportation Needs (08/23/2022)  Utilities: Not At Risk (08/23/2022)  Tobacco Use: Low Risk  (08/23/2022)     Readmission Risk Interventions    12/01/2021    8:27 AM 10/29/2021    9:25 AM 10/28/2021    9:11 AM  Readmission Risk Prevention Plan  Post Dischage Appt Complete Complete Complete  Medication Screening Complete Complete Complete  Transportation Screening Complete Complete Complete
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
## 2022-08-29 NOTE — Plan of Care (Signed)

## 2022-08-29 NOTE — TOC Progression Note (Signed)
Transition of Care [facility]) - Progression Note    Patient Details  Name: Danielle Colon MRN: [id number] Date of Birth: 1930-07-17  Transition of Care [facility] CM/SW Contact  Leander Rams, LCSW Phone Number: 08/29/2022, 3:03 PM  Clinical Narrative:    CSW called and spoke with pt daughter regarding SNF. CSW faxed to additional facilities that pt daughter [name] requested. All declined.   [facility]. CSW reached out to [facility] who states she is unsure when she will have a bed available. CSW communicated this information with the daughter. Daughter has now chosen to go home with [facility].   TOC will continue to follow.    Expected Discharge Plan: Home w Home Health Services Barriers to Discharge: No Barriers Identified  Expected Discharge Plan and Services In-house Referral: NA Discharge Planning Services: CM Consult Post Acute Care Choice: Home Health Living arrangements for the past 2 months: Single Family Home Expected Discharge Date: 08/29/22               DME Arranged: N/A DME Agency: NA       HH Arranged: PT, OT, Nurse's Aide, Social Work [employer] Agency: [facility] Date HH Agency Contacted: 08/26/22 Time HH Agency Contacted: 1552 Representative spoke with at [facility] Agency: Kandee Keen   Social Determinants of Health (SDOH) Interventions SDOH Screenings   Food Insecurity: No Food Insecurity (08/23/2022)  Housing: Low Risk  (08/23/2022)  Transportation Needs: No Transportation Needs (08/23/2022)  Utilities: Not At Risk (08/23/2022)  Tobacco Use: Low Risk  (08/23/2022)    Readmission Risk Interventions    12/01/2021    8:27 AM 10/29/2021    9:25 AM 10/28/2021    9:11 AM  Readmission Risk Prevention Plan  Post Dischage Appt Complete Complete Complete  Medication Screening Complete Complete Complete  Transportation Screening Complete Complete Complete    , MSW, LCSWA, LCASA Transitions of Care  Clinical Social Worker I

## 2022-08-29 NOTE — Plan of Care (Signed)
  Problem: Education: Goal: Knowledge of condition and prescribed therapy will improve Outcome: Progressing   Problem: Cardiac: Goal: Will achieve and/or maintain adequate cardiac output Outcome: Progressing   Problem: Physical Regulation: Goal: Complications related to the disease process, condition or treatment will be avoided or minimized Outcome: Progressing   Problem: Education: Goal: Knowledge of General Education information will improve Description: Including pain rating scale, medication(s)/side effects and non-pharmacologic comfort measures Outcome: Progressing   Problem: Health Behavior/Discharge Planning: Goal: Ability to manage health-related needs will improve Outcome: Progressing   Problem: Clinical Measurements: Goal: Ability to maintain clinical measurements within normal limits will improve Outcome: Progressing
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
## 2022-09-03 DIAGNOSIS — N179 Acute kidney failure, unspecified: Secondary | ICD-10-CM | POA: Diagnosis not present

## 2022-09-03 DIAGNOSIS — Z950 Presence of cardiac pacemaker: Secondary | ICD-10-CM | POA: Diagnosis not present

## 2022-09-03 DIAGNOSIS — Z9181 History of falling: Secondary | ICD-10-CM | POA: Diagnosis not present

## 2022-09-03 DIAGNOSIS — G510 Bell's palsy: Secondary | ICD-10-CM | POA: Diagnosis not present

## 2022-09-03 DIAGNOSIS — M47812 Spondylosis without myelopathy or radiculopathy, cervical region: Secondary | ICD-10-CM | POA: Diagnosis not present

## 2022-09-03 DIAGNOSIS — I129 Hypertensive chronic kidney disease with stage 1 through stage 4 chronic kidney disease, or unspecified chronic kidney disease: Secondary | ICD-10-CM | POA: Diagnosis not present

## 2022-09-03 DIAGNOSIS — Z8673 Personal history of transient ischemic attack (TIA), and cerebral infarction without residual deficits: Secondary | ICD-10-CM | POA: Diagnosis not present

## 2022-09-03 DIAGNOSIS — K219 Gastro-esophageal reflux disease without esophagitis: Secondary | ICD-10-CM | POA: Diagnosis not present

## 2022-09-03 DIAGNOSIS — E785 Hyperlipidemia, unspecified: Secondary | ICD-10-CM | POA: Diagnosis not present

## 2022-09-03 DIAGNOSIS — N1831 Chronic kidney disease, stage 3a: Secondary | ICD-10-CM | POA: Diagnosis not present

## 2022-09-03 DIAGNOSIS — I495 Sick sinus syndrome: Secondary | ICD-10-CM | POA: Diagnosis not present

## 2022-09-03 DIAGNOSIS — I482 Chronic atrial fibrillation, unspecified: Secondary | ICD-10-CM | POA: Diagnosis not present

## 2022-09-03 DIAGNOSIS — Z7901 Long term (current) use of anticoagulants: Secondary | ICD-10-CM | POA: Diagnosis not present

## 2022-09-05 DIAGNOSIS — Z6829 Body mass index (BMI) 29.0-29.9, adult: Secondary | ICD-10-CM | POA: Diagnosis not present

## 2022-09-05 DIAGNOSIS — S46911A Strain of unspecified muscle, fascia and tendon at shoulder and upper arm level, right arm, initial encounter: Secondary | ICD-10-CM | POA: Diagnosis not present

## 2022-09-05 DIAGNOSIS — R609 Edema, unspecified: Secondary | ICD-10-CM | POA: Diagnosis not present

## 2022-09-05 DIAGNOSIS — Z09 Encounter for follow-up examination after completed treatment for conditions other than malignant neoplasm: Secondary | ICD-10-CM | POA: Diagnosis not present

## 2022-09-05 DIAGNOSIS — R252 Cramp and spasm: Secondary | ICD-10-CM | POA: Diagnosis not present

## 2022-09-12 DIAGNOSIS — R55 Syncope and collapse: Secondary | ICD-10-CM | POA: Diagnosis not present

## 2022-09-12 DIAGNOSIS — R609 Edema, unspecified: Secondary | ICD-10-CM | POA: Diagnosis not present

## 2022-09-12 DIAGNOSIS — Z09 Encounter for follow-up examination after completed treatment for conditions other than malignant neoplasm: Secondary | ICD-10-CM | POA: Diagnosis not present

## 2022-09-12 DIAGNOSIS — R7309 Other abnormal glucose: Secondary | ICD-10-CM | POA: Diagnosis not present

## 2022-09-12 DIAGNOSIS — R899 Unspecified abnormal finding in specimens from other organs, systems and tissues: Secondary | ICD-10-CM | POA: Diagnosis not present

## 2022-09-12 DIAGNOSIS — Z6827 Body mass index (BMI) 27.0-27.9, adult: Secondary | ICD-10-CM | POA: Diagnosis not present

## 2022-09-12 DIAGNOSIS — S60222D Contusion of left hand, subsequent encounter: Secondary | ICD-10-CM | POA: Diagnosis not present

## 2022-09-12 DIAGNOSIS — R634 Abnormal weight loss: Secondary | ICD-10-CM | POA: Diagnosis not present

## 2022-09-12 DIAGNOSIS — N183 Chronic kidney disease, stage 3 unspecified: Secondary | ICD-10-CM | POA: Diagnosis not present
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
## 2022-09-14 ENCOUNTER — Encounter: Payer: Self-pay | Admitting: Physician Assistant

## 2022-09-14 ENCOUNTER — Ambulatory Visit: Payer: PPO | Attending: Physician Assistant | Admitting: Physician Assistant

## 2022-09-14 VITALS — BP 130/72 | HR 61 | Ht 62.0 in | Wt 144.4 lb

## 2022-09-14 DIAGNOSIS — I10 Essential (primary) hypertension: Secondary | ICD-10-CM | POA: Diagnosis not present

## 2022-09-14 DIAGNOSIS — R55 Syncope and collapse: Secondary | ICD-10-CM | POA: Diagnosis not present

## 2022-09-14 DIAGNOSIS — Z950 Presence of cardiac pacemaker: Secondary | ICD-10-CM | POA: Diagnosis not present

## 2022-09-14 DIAGNOSIS — I480 Paroxysmal atrial fibrillation: Secondary | ICD-10-CM | POA: Diagnosis not present

## 2022-09-14 NOTE — Patient Instructions (Addendum)
Medication Instructions:   Your physician recommends that you continue on your current medications as directed. Please refer to the Current Medication list given to you today.  *If you need a refill on your cardiac medications before your next appointment, please call your pharmacy*   Lab Work: NONE ORDERED  TODAY   If you have labs (blood work) drawn today and your tests are completely normal, you will receive your results only by: MyChart Message (if you have MyChart) OR A paper copy in the mail If you have any lab test that is abnormal or we need to change your treatment, we will call you to review the results.   Testing/Procedures: NONE ORDERED  TODAY     Follow-Up: At [facility], you and your health needs are our priority.  As part of our continuing mission to provide you with exceptional heart care, we have created designated Provider Care Teams.  These Care Teams include your primary Cardiologist (physician) and Advanced Practice Providers (APPs -  Physician Assistants and Nurse Practitioners) who all work together to provide you with the care you need, when you need it.  We recommend signing up for the patient portal called "MyChart".  Sign up information is provided on this After Visit Summary.  MyChart is used to connect with patients for Virtual Visits (Telemedicine).  Patients are able to view lab/test results, encounter notes, upcoming appointments, etc.  Non-urgent messages can be sent to your provider as well.   To learn more about what you can do with MyChart, go to [email or web address].    Your next appointment:   2-3 month(s)  ( CONTACT  EP SCHEDULING FOR ISSUES )    Provider:   Sherryl Manges, MD    Other Instructions

## 2022-09-15 ENCOUNTER — Other Ambulatory Visit: Payer: Self-pay

## 2022-09-15 ENCOUNTER — Ambulatory Visit: Payer: PPO | Admitting: Neurology

## 2022-09-15 ENCOUNTER — Encounter: Payer: Self-pay | Admitting: Neurology

## 2022-09-15 DIAGNOSIS — R404 Transient alteration of awareness: Secondary | ICD-10-CM | POA: Diagnosis not present

## 2022-09-15 DIAGNOSIS — D696 Thrombocytopenia, unspecified: Secondary | ICD-10-CM | POA: Diagnosis not present

## 2022-09-15 DIAGNOSIS — I495 Sick sinus syndrome: Secondary | ICD-10-CM | POA: Diagnosis not present

## 2022-09-15 DIAGNOSIS — K861 Other chronic pancreatitis: Secondary | ICD-10-CM | POA: Diagnosis not present

## 2022-09-15 DIAGNOSIS — E876 Hypokalemia: Secondary | ICD-10-CM | POA: Diagnosis not present

## 2022-09-15 DIAGNOSIS — Z950 Presence of cardiac pacemaker: Secondary | ICD-10-CM | POA: Diagnosis not present

## 2022-09-15 DIAGNOSIS — N1831 Chronic kidney disease, stage 3a: Secondary | ICD-10-CM | POA: Diagnosis not present

## 2022-09-15 MED ORDER — DILTIAZEM HCL ER COATED BEADS 180 MG PO CP24: 180.0000 mg | ORAL_CAPSULE | Freq: Two times a day (BID) | ORAL | 11 refills | Status: AC
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
## 2022-09-15 NOTE — Patient Instructions (Addendum)
We will add an EEG-  this is the only test we can offer, MRI is not possible .   LOC, syncope Near-Syncope Near-syncope is when you suddenly feel like you might pass out or faint. This may also be called presyncope. During an episode of near-syncope, you may: Feel dizzy, weak, or light-headed. It may feel like the room is spinning. Feel like you may vomit (nauseous). See spots or see all white or all black. Have cold, clammy skin. Feel warm and sweaty. Hear ringing in your ears. This condition is caused by a sudden decrease in blood flow to the brain. This can result from many causes, but most of those causes are not dangerous. However, near-syncope may be a sign of a serious medical problem, so it is important to seek medical care. Follow these instructions at home: Medicines Take over-the-counter and prescription medicines only as told by your doctor. If you are taking blood pressure or heart medicine, get up slowly and spend many minutes getting ready to sit and then stand. This can help with dizziness. Lifestyle Do not drive, use machinery, or play sports until your doctor says it is okay. Do not drink alcohol. Do not smoke or use any products that contain nicotine or tobacco. If you need help quitting, ask your doctor. Avoid hot tubs and saunas. General instructions Be aware of any changes in your symptoms. Talk with your doctor about your symptoms. You may need to have testing to help find the cause. If you start to feel like you might pass out, sit or lie down right away. If sitting, lower your head down between your legs. If lying down, raise (elevate) your feet above the level of your heart. Breathe deeply and steadily. Wait until all of the symptoms are gone. Have someone stay with you until you feel better. Drink enough fluid to keep your pee (urine) pale yellow. Avoid standing for a long time. If you must stand for a long time, do movements such as: Moving your legs. Crossing  your legs. Flexing and stretching your leg muscles. Squatting. Keep all follow-up visits. Contact a doctor if: You continue to have episodes of near fainting. Get help right away if: You pass out or faint. You have any of these symptoms: Fast or uneven heartbeats (palpitations). Pain in your chest, belly, or back. Shortness of breath. You have a seizure. You have a very bad headache. You are confused. You have trouble seeing. You are very weak. You have trouble walking. You are bleeding from your mouth or butt. You have black or tarry poop (stool). These symptoms may be an emergency. Get help right away. Call your local emergency services (911 in the [place] Do not wait to see if the symptoms will go away. Do not drive yourself to the hospital. Summary Near-syncope is when you suddenly feel like you might pass out or faint. This condition is caused by a sudden decrease in blood flow to the brain. Near-syncope may be a sign of a serious medical problem, so it is important to seek medical care. If you start to feel like you might pass out, sit or lie down right away. If sitting, lower your head down between your legs. If lying down, raise (elevate) your feet above the level of your heart. Talk with your doctor about your symptoms. You may need to have testing to help find the cause. This information is not intended to replace advice given to you by your health care provider. Make  sure you discuss any questions you have with your health care provider. Document Revised: 05/14/2020 Document Reviewed: 05/14/2020 Elsevier Patient Education  2024 [employer].

## 2022-09-22 ENCOUNTER — Ambulatory Visit (INDEPENDENT_AMBULATORY_CARE_PROVIDER_SITE_OTHER): Payer: PPO | Admitting: Neurology

## 2022-09-22 ENCOUNTER — Encounter: Payer: Self-pay | Admitting: *Deleted

## 2022-09-22 ENCOUNTER — Telehealth: Payer: Self-pay | Admitting: *Deleted

## 2022-09-22 DIAGNOSIS — Z950 Presence of cardiac pacemaker: Secondary | ICD-10-CM

## 2022-09-22 DIAGNOSIS — R4182 Altered mental status, unspecified: Secondary | ICD-10-CM | POA: Diagnosis not present

## 2022-09-22 DIAGNOSIS — I495 Sick sinus syndrome: Secondary | ICD-10-CM

## 2022-09-22 DIAGNOSIS — R404 Transient alteration of awareness: Secondary | ICD-10-CM

## 2022-09-22 DIAGNOSIS — E876 Hypokalemia: Secondary | ICD-10-CM
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
## 2022-09-22 NOTE — Telephone Encounter (Signed)
-----   Message from Sheilah Pigeon sent at 09/22/2022  2:31 PM EDT ----- Reviewed with Dr. Graciela Husbands, her indicated dose for Eliquis is 5mg  BID for her kidney function and weight.  Recommend that we change to 5mg  BID. Please let me know if they are/are not agreeable  Thanks Renee

## 2022-09-22 NOTE — Telephone Encounter (Signed)
Spoke with patient  Dtr who is aware for the Eliquis  dose change 5 mg  but stated the patient has already got her month supply, which is financially better to pay for them though still expensive. Can she wait till the following month and pick new Rx then start taking 5mg  or double up  2.5  twice a day but will need financial asst to buy new Rx  sooner.

## 2022-09-23 ENCOUNTER — Ambulatory Visit (INDEPENDENT_AMBULATORY_CARE_PROVIDER_SITE_OTHER): Payer: Medicare Other

## 2022-09-23 DIAGNOSIS — I495 Sick sinus syndrome: Secondary | ICD-10-CM

## 2022-09-23 LAB — CUP PACEART REMOTE DEVICE CHECK
Battery Remaining Longevity: 137 mo
Battery Voltage: 3.02 V
Brady Statistic AP VP Percent: 0.1 %
Brady Statistic AP VS Percent: 43.08 %
Brady Statistic AS VP Percent: 0.02 %
Brady Statistic AS VS Percent: 56.88 %
Brady Statistic RA Percent Paced: 19.86 %
Brady Statistic RV Percent Paced: 4.35 %
Date Time Interrogation Session: 20240905235535
Implantable Lead Connection Status: 753985
Implantable Lead Connection Status: 753985
Implantable Lead Implant Date: 20210910
Implantable Lead Implant Date: 20210910
Implantable Lead Location: 753859
Implantable Lead Location: 753860
Implantable Lead Model: 5076
Implantable Lead Model: 5076
Implantable Pulse Generator Implant Date: 20210910
Lead Channel Impedance Value: 285 Ohm
Lead Channel Impedance Value: 361 Ohm
Lead Channel Impedance Value: 399 Ohm
Lead Channel Impedance Value: 456 Ohm
Lead Channel Pacing Threshold Amplitude: 0.5 V
Lead Channel Pacing Threshold Amplitude: 0.75 V
Lead Channel Pacing Threshold Pulse Width: 0.4 ms
Lead Channel Pacing Threshold Pulse Width: 0.4 ms
Lead Channel Sensing Intrinsic Amplitude: 1.5 mV
Lead Channel Sensing Intrinsic Amplitude: 1.5 mV
Lead Channel Sensing Intrinsic Amplitude: 5 mV
Lead Channel Sensing Intrinsic Amplitude: 5 mV
Lead Channel Setting Pacing Amplitude: 1.5 V
Lead Channel Setting Pacing Amplitude: 2.5 V
Lead Channel Setting Pacing Pulse Width: 0.4 ms
Lead Channel Setting Sensing Sensitivity: 1.2 mV
Zone Setting Status: 755011
Zone Setting Status: 755011

## 2022-09-26 ENCOUNTER — Telehealth: Payer: Self-pay | Admitting: Anesthesiology

## 2022-09-26 NOTE — Telephone Encounter (Signed)
Called pt's daughter [name] (ok per DPR). Per Dr Dohmeier: Informed of normal EEG results, no evidence of seizures. Also that no neurological follow up was needed. Pt's daughter verbalized understanding. Pt's daughter had no additional questions at this time but was encouraged to call back if questions arise.
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
## 2022-09-27 MED ORDER — APIXABAN 5 MG PO TABS: 5.0000 mg | ORAL_TABLET | Freq: Two times a day (BID) | ORAL | 6 refills | Status: AC

## 2022-09-27 MED ORDER — APIXABAN 5 MG PO TABS: 5.0000 mg | ORAL_TABLET | Freq: Two times a day (BID) | ORAL | 0 refills | Status: DC

## 2022-09-27 NOTE — Telephone Encounter (Signed)
Spoke with Dtr who is aware patient to take ELIQUIS 2.5 mg  two tablets twice a day  so that she will get the equivalent of 5 mg twice a day.  Then she will start patient to take 5 mg samples that 2 weeks worth to finish out month are at the front desk for pick up. New Rx sent into pharmacy with note medication dose increase.

## 2022-09-27 NOTE — Progress Notes (Signed)
Remote pacemaker transmission.   

## 2022-11-28 ENCOUNTER — Ambulatory Visit: Payer: PPO | Admitting: Internal Medicine
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
## 2022-12-02 ENCOUNTER — Telehealth: Payer: Self-pay | Admitting: Internal Medicine

## 2022-12-02 ENCOUNTER — Ambulatory Visit (HOSPITAL_COMMUNITY)
Admission: RE | Admit: 2022-12-02 | Discharge: 2022-12-02 | Disposition: A | Discharge status: Home / Self Care | Payer: PPO | Source: Ambulatory Visit | Attending: Internal Medicine | Admitting: Internal Medicine

## 2022-12-02 VITALS — BP 138/80 | HR 87 | Ht 62.0 in | Wt 156.2 lb

## 2022-12-02 DIAGNOSIS — G510 Bell's palsy: Secondary | ICD-10-CM | POA: Insufficient documentation

## 2022-12-02 DIAGNOSIS — Z8673 Personal history of transient ischemic attack (TIA), and cerebral infarction without residual deficits: Secondary | ICD-10-CM | POA: Insufficient documentation

## 2022-12-02 DIAGNOSIS — N1831 Chronic kidney disease, stage 3a: Secondary | ICD-10-CM | POA: Diagnosis not present

## 2022-12-02 DIAGNOSIS — D6869 Other thrombophilia: Secondary | ICD-10-CM | POA: Diagnosis not present

## 2022-12-02 DIAGNOSIS — I480 Paroxysmal atrial fibrillation: Secondary | ICD-10-CM | POA: Diagnosis not present

## 2022-12-02 DIAGNOSIS — I4891 Unspecified atrial fibrillation: Secondary | ICD-10-CM | POA: Diagnosis not present

## 2022-12-02 DIAGNOSIS — I129 Hypertensive chronic kidney disease with stage 1 through stage 4 chronic kidney disease, or unspecified chronic kidney disease: Secondary | ICD-10-CM | POA: Insufficient documentation

## 2022-12-02 DIAGNOSIS — Z7901 Long term (current) use of anticoagulants: Secondary | ICD-10-CM | POA: Diagnosis not present

## 2022-12-02 NOTE — Telephone Encounter (Signed)
Pt daughter called in stating "she is not feeling well, she is short of breath and weak". Please advise. She stated she could not hear her SOB currently. Please advise.

## 2022-12-02 NOTE — Patient Instructions (Signed)
Limit salt intake, elevate legs when sitting  Be sure to take Eliquis 5mg  twice a day

## 2022-12-02 NOTE — Progress Notes (Addendum)
Primary Care Physician: Daisy Floro, MD Primary Cardiologist: Debbe Odea, MD Electrophysiologist: None     Referring Physician: Device clinic     Danielle Colon is a 89 y.o. female with a history of TIA, Bell's palsy, HTN, HLD, tachybrady syndrome s/p PPM, CKD (IIIa), and paroxysmal atrial fibrillation who presents for consultation in the [facility]. Patient's daughter notified clinic today patient is not feeling well and is SOB and weak. Device interrogation shows overall elevated burden of Afib which is not new and at last OV noted to be 58%; continues to have paroxysmal Afib with controlled ventricular rates. Patient is on Eliquis 5 mg BID for a CHADS2VASC score of 6.  On evaluation today, she is currently in Afib. She has been taking Eliquis 2.5 mg BID. She notes today she felt weak and specifically states she feels weak from time to time. She notes her lower legs are a little bit more swollen today than usual and it's likely due to eating salty foods yesterday. She notes walking will help her feel better.  Today, she denies symptoms of palpitations, chest pain, shortness of breath, orthopnea, PND, lower extremity edema, dizziness, presyncope, syncope, snoring, daytime somnolence, bleeding, or neurologic sequela. The patient is tolerating medications without difficulties and is otherwise without complaint today.   she has a BMI of Body mass index is 28.57 kg/m.Marland Kitchen Filed Weights   12/02/22 1146  Weight: 70.9 kg    Current Outpatient Medications  Medication Sig Dispense Refill   acetaminophen (TYLENOL) 500 MG tablet Take 500 mg by mouth 2 (two) times daily as needed for mild pain.     apixaban (ELIQUIS) 5 MG TABS tablet Take 1 tablet (5 mg total) by mouth 2 (two) times daily. 60 tablet 6   Artificial Tear Ointment (DRY EYES OP) Place 1 application  into both eyes at bedtime.     Cholecalciferol (VITAMIN D3) 1000 units CAPS Take 1,000 Units  by mouth daily.     CREON 24000-76000 units CPEP Take 24,000 Units by mouth with breakfast, with lunch, and with evening meal.     diltiazem (CARDIZEM CD) 180 MG 24 hr capsule Take 1 capsule (180 mg total) by mouth 2 (two) times daily. 60 capsule 11   pantoprazole (PROTONIX) 40 MG tablet Take 40 mg by mouth daily before breakfast.     No current facility-administered medications for this encounter.    Atrial Fibrillation Management history:  Previous antiarrhythmic drugs: none Previous cardioversions: none Previous ablations: none Anticoagulation history: Eliquis   ROS- All systems are reviewed and negative except as per the HPI above.  Physical Exam: BP 138/80   Pulse 87   Ht 5\' 2"  (1.575 m)   Wt 70.9 kg   BMI 28.57 kg/m   GEN: Well nourished, well developed in no acute distress NECK: No JVD; No carotid bruits CARDIAC: Irregularly irregular rate and rhythm, no murmurs, rubs, gallops RESPIRATORY:  Clear to auscultation without rales, wheezing or rhonchi  ABDOMEN: Soft, non-tender, non-distended EXTREMITIES:  1+ edema bilaterally; No deformity   EKG today demonstrates  Vent. rate 87 BPM PR interval * ms QRS duration 70 ms QT/QTcB 364/438 ms P-R-T axes * -58 76 Atrial fibrillation Left axis deviation Low voltage QRS Septal infarct , age undetermined Abnormal ECG When compared with ECG of 24-Aug-2022 23:45, PREVIOUS ECG IS PRESENT  Echo 08/23/22 demonstrated  1. Left ventricular ejection fraction, by estimation, is 60 to 65%. Left  ventricular ejection fraction by  PLAX is 65 %. The left ventricle has  normal function. The left ventricle has no regional wall motion  abnormalities. Left ventricular diastolic  parameters are indeterminate.   2. Right ventricular systolic function is normal. The right ventricular  size is normal. There is normal pulmonary artery systolic pressure.   3. Left atrial size was severely dilated.   4. The mitral valve is normal in structure.  Trivial mitral valve  regurgitation. No evidence of mitral stenosis.   5. The aortic valve is tricuspid. Aortic valve regurgitation is not  visualized. No aortic stenosis is present.   6. The inferior vena cava is normal in size with greater than 50%  respiratory variability, suggesting right atrial pressure of 3 mmHg.    ASSESSMENT & PLAN CHA2DS2-VASc Score = 6  The patient's score is based upon: CHF History: 0 HTN History: 1 Diabetes History: 0 Stroke History: 2 Vascular Disease History: 0 Age Score: 2 Gender Score: 1       ASSESSMENT AND PLAN: Paroxysmal Atrial Fibrillation (ICD10:  I48.0) The patient's CHA2DS2-VASc score is 6, indicating a 9.7% annual risk of stroke.    She is currently in Afib. Device clinic report shows historical and current paroxysmal Afib with 58% burden noted in August. There is no indication to perform cardioversion given paroxysaml Afib. Unclear if SOB and weakness are specifically attributable to Afib given historical high burden. She is already on diltiazem 180 mg BID and advised to continue this without change. Her HR is 87 today in office so do not feel addition of rate control is indicated. Recent echo in August shows normal function. Recent labs in August show no anemia, potassium 4.0, and magnesium 1.8. Advised patient and daughter at this time I will not make any changes or addition to her medication regimen at this time. I favor conservative observation and advised daughter to keep a record of how frequent she feels weak and to record BP/HR with these episodes. I recommended to patient to please eat less salty foods in her diet and to elevate her legs.   Secondary Hypercoagulable State (ICD10:  D68.69) The patient is at significant risk for stroke/thromboembolism based upon her CHA2DS2-VASc Score of 6.  Continue Apixaban (Eliquis).  Advised and educated patient on correct dosage. Continue Eliquis 5 mg BID without interruption.   Follow up Afib  clinic prn. Follow up as scheduled with Dr. Graciela Husbands.   Lake Bells, PA-C  Afib Clinic [facility] [address] [place], [place] [address] [phone]

## 2022-12-02 NOTE — Telephone Encounter (Signed)
Pt's daughter,DPR advised pt is currently in Afib.  Referral placed for Afib appointment and pt scheduled for today 12/02/2022 at 1130am.  Pt and pt's daughter verbalizes understanding and agrees with current plan.

## 2022-12-02 NOTE — Telephone Encounter (Signed)
Patient remains in AF. Ventricular rates controlled on presenting. AF clinic apt made today for 11:30. Mindi Junker, RN aware.

## 2022-12-02 NOTE — Telephone Encounter (Signed)
Spoke with pt who reports intermittent SOB, dizziness and LEE x 2 days.  Pt states she has been taking medications as prescribed.  Current BP 128/76 HR-83.  Requested pt send a device transmission and will have device review.  Pt verbalizes understanding and agrees with current plan.

## 2022-12-06 ENCOUNTER — Other Ambulatory Visit (HOSPITAL_COMMUNITY): Payer: Self-pay | Admitting: Family Medicine

## 2022-12-06 ENCOUNTER — Ambulatory Visit (HOSPITAL_COMMUNITY)
Admission: RE | Admit: 2022-12-06 | Discharge: 2022-12-06 | Disposition: A | Discharge status: Home / Self Care | Payer: PPO | Source: Ambulatory Visit | Attending: Family Medicine | Admitting: Family Medicine

## 2022-12-06 ENCOUNTER — Encounter: Payer: PPO | Admitting: Internal Medicine

## 2022-12-06 DIAGNOSIS — M79604 Pain in right leg: Secondary | ICD-10-CM

## 2022-12-06 DIAGNOSIS — R609 Edema, unspecified: Secondary | ICD-10-CM | POA: Diagnosis not present

## 2022-12-06 DIAGNOSIS — M7989 Other specified soft tissue disorders: Secondary | ICD-10-CM | POA: Diagnosis not present

## 2022-12-06 DIAGNOSIS — D485 Neoplasm of uncertain behavior of skin: Secondary | ICD-10-CM | POA: Diagnosis not present

## 2022-12-06 DIAGNOSIS — Z6829 Body mass index (BMI) 29.0-29.9, adult: Secondary | ICD-10-CM | POA: Diagnosis not present

## 2022-12-06 DIAGNOSIS — M79651 Pain in right thigh: Secondary | ICD-10-CM | POA: Diagnosis not present

## 2022-12-13 ENCOUNTER — Encounter: Payer: Self-pay | Admitting: Internal Medicine

## 2022-12-13 ENCOUNTER — Ambulatory Visit: Payer: PPO | Attending: Internal Medicine | Admitting: Internal Medicine

## 2022-12-13 VITALS — BP 114/66 | HR 80 | Ht 62.0 in | Wt 146.2 lb

## 2022-12-13 DIAGNOSIS — I480 Paroxysmal atrial fibrillation: Secondary | ICD-10-CM | POA: Diagnosis not present

## 2022-12-13 LAB — PACEMAKER DEVICE OBSERVATION

## 2022-12-13 MED ORDER — FLECAINIDE ACETATE 50 MG PO TABS: 50.0000 mg | ORAL_TABLET | Freq: Two times a day (BID) | ORAL | 3 refills | Status: DC
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
## 2022-12-13 NOTE — Patient Instructions (Addendum)
Medication Instructions:  Your physician recommends the following medication changes.  START TAKING: Flecainide 50 mg by mouth twice day  *If you need a refill on your cardiac medications before your next appointment, please call your pharmacy*   Lab Work: Your provider would like for you to return in Tuesday 12/20/22 to have the following labs drawn: (CBC, BMP).   Please go to [facility] [address] ([facility], [place] [address] You do not need an appointment.  They are open from 7:30 am-4 pm.  Lunch from 1:00 pm- 2:00 pm You will not need to be fasting.   Testing/Procedures: Cardioversion (Please see instructions below)   Follow-Up: At [facility], you and your health needs are our priority.  As part of our continuing mission to provide you with exceptional heart care, we have created designated Provider Care Teams.  These Care Teams include your primary Cardiologist (physician) and Advanced Practice Providers (APPs -  Physician Assistants and Nurse Practitioners) who all work together to provide you with the care you need, when you need it.  We recommend signing up for the patient portal called "MyChart".  Sign up information is provided on this After Visit Summary.  MyChart is used to connect with patients for Virtual Visits (Telemedicine).  Patients are able to view lab/test results, encounter notes, upcoming appointments, etc.  Non-urgent messages can be sent to your provider as well.   To learn more about what you can do with MyChart, go to [email or web address].    Your next appointment:   4 week(s)  Provider:   Francis Dowse       Dear Danielle Colon [name]  You are scheduled for a Cardioversion on Wednesday, December 4 with Dr. Mariah Milling.  Please arrive at the Heart & Vascular Center Entrance of [facility], [address], [place] [address] at 11:00 AM (This is 1 hour(s) prior to your procedure time).  Proceed to the Check-In Desk  directly inside the entrance.  Procedure Parking: Use the entrance off of the [address] side of the hospital. Turn right upon entering and follow the driveway to parking that is directly in front of the Heart & Vascular Center. There is no valet parking available at this entrance, however there is an awning directly in front of the Heart & Vascular Center for drop off/ pick up for patients.    DIET:  Nothing to eat or drink after midnight except a sip of water with medications (see medication instructions below)  MEDICATION INSTRUCTIONS: Hold Lasix morning of procedure    Continue taking your anticoagulant (blood thinner): Apixaban (Eliquis).  You will need to continue this after your procedure until you are told by your provider that it is safe to stop.    LABS:  Tuesday 12/20/22 (CBC, BMP)  FYI:  For your safety, and to allow [place] to monitor your vital signs accurately during the surgery/procedure we request: If you have artificial nails, gel coating, SNS etc, please have those removed prior to your surgery/procedure. Not having the nail coverings /polish removed may result in cancellation or delay of your surgery/procedure.  Your support person will be asked to wait in the waiting room during your procedure.  It is OK to have someone drop you off and come back when you are ready to be discharged.  You cannot drive after the procedure and will need someone to drive you home.  Bring your insurance cards.  *Special Note: Every effort is made to  have your procedure done on time. Occasionally there are emergencies that occur at the hospital that may cause delays. Please be patient if a delay does occur.

## 2022-12-19 ENCOUNTER — Other Ambulatory Visit: Payer: Self-pay | Admitting: Internal Medicine

## 2022-12-19 DIAGNOSIS — I4819 Other persistent atrial fibrillation: Secondary | ICD-10-CM

## 2022-12-19 LAB — CUP PACEART INCLINIC DEVICE CHECK
Date Time Interrogation Session: 20241126102154
Implantable Lead Connection Status: 753985
Implantable Lead Connection Status: 753985
Implantable Lead Implant Date: 20210910
Implantable Lead Implant Date: 20210910
Implantable Lead Location: 753859
Implantable Lead Location: 753860
Implantable Lead Model: 5076
Implantable Lead Model: 5076
Implantable Pulse Generator Implant Date: 20210910

## 2022-12-20 ENCOUNTER — Telehealth: Payer: Self-pay | Admitting: *Deleted

## 2022-12-20 DIAGNOSIS — I480 Paroxysmal atrial fibrillation: Secondary | ICD-10-CM | POA: Diagnosis not present

## 2022-12-20 MED ORDER — SODIUM CHLORIDE 0.9 % IV SOLN: INTRAVENOUS | Status: DC

## 2022-12-20 NOTE — Telephone Encounter (Signed)
Patients daughter reports they did a download check this morning and waiting to hear from that. Called over to device clinic and they report that she is still in Afib. Will update provider

## 2022-12-21 ENCOUNTER — Encounter
Admission: RE | Disposition: A | Discharge status: Home / Self Care | Payer: Self-pay | Source: Home / Self Care | Attending: Cardiovascular Disease

## 2022-12-21 ENCOUNTER — Ambulatory Visit: Payer: PPO | Admitting: Anesthesiology

## 2022-12-21 ENCOUNTER — Ambulatory Visit
Admission: RE | Admit: 2022-12-21 | Discharge: 2022-12-21 | Disposition: A | Discharge status: Home / Self Care | Payer: PPO | Attending: Cardiovascular Disease | Admitting: Cardiovascular Disease

## 2022-12-21 DIAGNOSIS — Z538 Procedure and treatment not carried out for other reasons: Secondary | ICD-10-CM | POA: Diagnosis not present

## 2022-12-21 DIAGNOSIS — I4891 Unspecified atrial fibrillation: Secondary | ICD-10-CM | POA: Insufficient documentation

## 2022-12-21 DIAGNOSIS — I4819 Other persistent atrial fibrillation: Secondary | ICD-10-CM

## 2022-12-21 HISTORY — PX: CARDIOVERSION: SHX1299

## 2022-12-21 LAB — CBC
Hematocrit: 36.8 % (ref 34.0–46.6)
Hemoglobin: 11.8 g/dL (ref 11.1–15.9)
MCH: 28.5 pg (ref 26.6–33.0)
MCHC: 32.1 g/dL (ref 31.5–35.7)
MCV: 89 fL (ref 79–97)
Platelets: 226 10*3/uL (ref 150–450)
RBC: 4.14 x10E6/uL (ref 3.77–5.28)
RDW: 12.8 % (ref 11.7–15.4)
WBC: 6.3 10*3/uL (ref 3.4–10.8)

## 2022-12-21 LAB — BASIC METABOLIC PANEL
BUN/Creatinine Ratio: 16 (ref 12–28)
BUN: 21 mg/dL (ref 10–36)
CO2: 20 mmol/L (ref 20–29)
Calcium: 10 mg/dL (ref 8.7–10.3)
Chloride: 99 mmol/L (ref 96–106)
Creatinine, Ser: 1.31 mg/dL — ABNORMAL HIGH (ref 0.57–1.00)
Glucose: 108 mg/dL — ABNORMAL HIGH (ref 70–99)
Potassium: 4.1 mmol/L (ref 3.5–5.2)
Sodium: 141 mmol/L (ref 134–144)
eGFR: 39 mL/min/{1.73_m2} — ABNORMAL LOW (ref >59)

## 2022-12-21 SURGERY — CARDIOVERSION
Anesthesia: General

## 2022-12-21 MED ORDER — PROPOFOL 10 MG/ML IV BOLUS
INTRAVENOUS | Status: AC
Start: 2022-12-21 — End: ?
  Filled 2022-12-21: qty 20

## 2022-12-21 NOTE — Progress Notes (Signed)
Patient has presented with NSR per ecg. Dr. Mariah Milling confirmed and patient is being discharged.

## 2022-12-22 ENCOUNTER — Encounter: Payer: Self-pay | Admitting: Cardiovascular Disease

## 2022-12-23 ENCOUNTER — Ambulatory Visit (INDEPENDENT_AMBULATORY_CARE_PROVIDER_SITE_OTHER): Payer: PPO

## 2022-12-23 DIAGNOSIS — I495 Sick sinus syndrome: Secondary | ICD-10-CM | POA: Diagnosis not present

## 2022-12-25 LAB — CUP PACEART REMOTE DEVICE CHECK
Battery Remaining Longevity: 134 mo
Battery Voltage: 3.02 V
Brady Statistic AP VP Percent: 0.14 %
Brady Statistic AP VS Percent: 82.29 %
Brady Statistic AS VP Percent: 0.08 %
Brady Statistic AS VS Percent: 17.59 %
Brady Statistic RA Percent Paced: 51.15 %
Brady Statistic RV Percent Paced: 5.1 %
Date Time Interrogation Session: 20241206112144
Implantable Lead Connection Status: 753985
Implantable Lead Connection Status: 753985
Implantable Lead Implant Date: 20210910
Implantable Lead Implant Date: 20210910
Implantable Lead Location: 753859
Implantable Lead Location: 753860
Implantable Lead Model: 5076
Implantable Lead Model: 5076
Implantable Pulse Generator Implant Date: 20210910
Lead Channel Impedance Value: 285 Ohm
Lead Channel Impedance Value: 342 Ohm
Lead Channel Impedance Value: 380 Ohm
Lead Channel Impedance Value: 456 Ohm
Lead Channel Pacing Threshold Amplitude: 0.625 V
Lead Channel Pacing Threshold Amplitude: 0.875 V
Lead Channel Pacing Threshold Pulse Width: 0.4 ms
Lead Channel Pacing Threshold Pulse Width: 0.4 ms
Lead Channel Sensing Intrinsic Amplitude: 0.375 mV
Lead Channel Sensing Intrinsic Amplitude: 0.375 mV
Lead Channel Sensing Intrinsic Amplitude: 4.875 mV
Lead Channel Sensing Intrinsic Amplitude: 4.875 mV
Lead Channel Setting Pacing Amplitude: 1.5 V
Lead Channel Setting Pacing Amplitude: 2.5 V
Lead Channel Setting Pacing Pulse Width: 0.4 ms
Lead Channel Setting Sensing Sensitivity: 1.2 mV
Zone Setting Status: 755011
Zone Setting Status: 755011
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
## 2023-01-13 ENCOUNTER — Ambulatory Visit: Payer: PPO | Attending: Physician Assistant | Admitting: Physician Assistant

## 2023-01-13 ENCOUNTER — Encounter: Payer: Self-pay | Admitting: Physician Assistant

## 2023-01-13 VITALS — BP 126/82 | HR 80 | Ht 61.0 in | Wt 154.6 lb

## 2023-01-13 DIAGNOSIS — Z79899 Other long term (current) drug therapy: Secondary | ICD-10-CM

## 2023-01-13 DIAGNOSIS — I5031 Acute diastolic (congestive) heart failure: Secondary | ICD-10-CM

## 2023-01-13 DIAGNOSIS — I10 Essential (primary) hypertension: Secondary | ICD-10-CM

## 2023-01-13 DIAGNOSIS — Z950 Presence of cardiac pacemaker: Secondary | ICD-10-CM | POA: Diagnosis not present

## 2023-01-13 DIAGNOSIS — D6869 Other thrombophilia: Secondary | ICD-10-CM | POA: Diagnosis not present

## 2023-01-13 DIAGNOSIS — I480 Paroxysmal atrial fibrillation: Secondary | ICD-10-CM | POA: Diagnosis not present

## 2023-01-13 LAB — CUP PACEART INCLINIC DEVICE CHECK
Battery Remaining Longevity: 134 mo
Battery Voltage: 3.02 V
Brady Statistic AP VP Percent: 0.11 %
Brady Statistic AP VS Percent: 69.21 %
Brady Statistic AS VP Percent: 0.07 %
Brady Statistic AS VS Percent: 30.69 %
Brady Statistic RA Percent Paced: 38.62 %
Brady Statistic RV Percent Paced: 7.91 %
Date Time Interrogation Session: 20241227132310
Implantable Lead Connection Status: 753985
Implantable Lead Connection Status: 753985
Implantable Lead Implant Date: 20210910
Implantable Lead Implant Date: 20210910
Implantable Lead Location: 753859
Implantable Lead Location: 753860
Implantable Lead Model: 5076
Implantable Lead Model: 5076
Implantable Pulse Generator Implant Date: 20210910
Lead Channel Impedance Value: 285 Ohm
Lead Channel Impedance Value: 361 Ohm
Lead Channel Impedance Value: 418 Ohm
Lead Channel Impedance Value: 475 Ohm
Lead Channel Pacing Threshold Amplitude: 0.625 V
Lead Channel Pacing Threshold Amplitude: 0.875 V
Lead Channel Pacing Threshold Pulse Width: 0.4 ms
Lead Channel Pacing Threshold Pulse Width: 0.4 ms
Lead Channel Sensing Intrinsic Amplitude: 0.75 mV
Lead Channel Sensing Intrinsic Amplitude: 0.75 mV
Lead Channel Sensing Intrinsic Amplitude: 5.25 mV
Lead Channel Sensing Intrinsic Amplitude: 5.625 mV
Lead Channel Setting Pacing Amplitude: 1.5 V
Lead Channel Setting Pacing Amplitude: 2.5 V
Lead Channel Setting Pacing Pulse Width: 0.4 ms
Lead Channel Setting Sensing Sensitivity: 1.2 mV
Zone Setting Status: 755011
Zone Setting Status: 755011

## 2023-01-13 MED ORDER — FUROSEMIDE 20 MG PO TABS: 20.0000 mg | ORAL_TABLET | Freq: Two times a day (BID) | ORAL | 2 refills | Status: DC

## 2023-01-13 NOTE — Patient Instructions (Addendum)
  Medication Instructions:   FOR TWO  DAYS ONLY: TAKE  LASIX(FUROSEMIDE)  20 MG THREE TIMES A DAY   THEN  START TAKING : LASIX (FUROSEMIDE) 20 MG TWICE A DAY   *If you need a refill on your cardiac medications before your next appointment, please call your pharmacy*   Lab Work: .  PLEASE GO DOWN STAIRS FIRST FLOOR  [address] :  BMET  TODAY     If you have labs (blood work) drawn today and your tests are completely normal, you will receive your results only by: MyChart Message (if you have MyChart) OR A paper copy in the mail If you have any lab test that is abnormal or we need to change your treatment, we will call you to review the results.   Testing/Procedures: NONE ORDERED  TODAY      Follow-Up: At [facility], you and your health needs are our priority.  As part of our continuing mission to provide you with exceptional heart care, we have created designated Provider Care Teams.  These Care Teams include your primary Cardiologist (physician) and Advanced Practice Providers (APPs -  Physician Assistants and Nurse Practitioners) who all work together to provide you with the care you need, when you need it.  We recommend signing up for the patient portal called "MyChart".  Sign up information is provided on this After Visit Summary.  MyChart is used to connect with patients for Virtual Visits (Telemedicine).  Patients are able to view lab/test results, encounter notes, upcoming appointments, etc.  Non-urgent messages can be sent to your provider as well.   To learn more about what you can do with MyChart, go to [email or web address].    Your next appointment:  NEXT WEEK  01-19-23 @1 :30PM  Provider:   Francis Dowse, PA-C    Other Instructions

## 2023-01-16 ENCOUNTER — Telehealth: Payer: Self-pay | Admitting: Physician Assistant

## 2023-01-16 ENCOUNTER — Other Ambulatory Visit: Payer: Self-pay | Admitting: *Deleted

## 2023-01-16 ENCOUNTER — Telehealth: Payer: Self-pay | Admitting: *Deleted

## 2023-01-16 DIAGNOSIS — Z79899 Other long term (current) drug therapy: Secondary | ICD-10-CM

## 2023-01-16 NOTE — Telephone Encounter (Signed)
Patient Dtr understood to stop Furosemide until repeat bloodwork and willing to bring mom in tomorrow for repeat blood work.   Dtr stated mom legs is still swollen also.Patient Dtr who stated patient was unable to give blood to [facility]. Dtr said lab tech couldn't  get any blood and said patient need to return hydrated. Dtr stated she told the staff to let us know  but they told her when we don't get the results we will know something wrong and contact them.

## 2023-01-16 NOTE — Telephone Encounter (Signed)
Spoke to [place], the patient's daughter, the patient was beside her.   She had already taken a dose of 20mg  lsaix this AM prior to our call. She has not appreciate a clear/brisk increase in urine output. Edema legs are same, perhaps a little more despite TID lasix 20mg  over the weekend. She has had nocturnal SOB She will take lasix 20mg  this afternoon Scheduled for labs to be done in the morning (asked for them to be ordered and run stat) They will let us know if the lab was still unable to get her lab done. I advised that she NOT aggressively hydrate   Discussed that if he swelling is not getting any better, or if she is having SOB,  ER precautions and to go ahead and go to the ER if needed.  Francis Dowse, PA-C

## 2023-01-16 NOTE — Addendum Note (Signed)
Addended by: Oleta Mouse on: 01/16/2023 02:34 PM   Modules accepted: Orders

## 2023-01-17 ENCOUNTER — Telehealth: Payer: Self-pay | Admitting: Physician Assistant

## 2023-01-17 DIAGNOSIS — Z79899 Other long term (current) drug therapy: Secondary | ICD-10-CM | POA: Diagnosis not present

## 2023-01-17 LAB — BASIC METABOLIC PANEL
BUN/Creatinine Ratio: 15 (ref 12–28)
BUN: 18 mg/dL (ref 10–36)
CO2: 20 mmol/L (ref 20–29)
Calcium: 10.1 mg/dL (ref 8.7–10.3)
Chloride: 101 mmol/L (ref 96–106)
Creatinine, Ser: 1.21 mg/dL — ABNORMAL HIGH (ref 0.57–1.00)
Glucose: 104 mg/dL — ABNORMAL HIGH (ref 70–99)
Potassium: 3.8 mmol/L (ref 3.5–5.2)
Sodium: 140 mmol/L (ref 134–144)
eGFR: 43 mL/min/{1.73_m2} — ABNORMAL LOW (ref >59)

## 2023-01-17 NOTE — Telephone Encounter (Signed)
 Called and spoke with the patient's daughter this morning.   Legs remained swollen, perhaps a bit worse, definitely nnot better No new SOB Advised no lab result yet at that point and would keep her update once I had them communicated with our office manager who has been in communication with [facility] Confirmed order was in as STAT though [facility] did not see that and was NOT processed as stat, though expected result by end of day 1725 no result yet Spoke with her daughter Mom more tired today, napped in the recliner, no SOB  Discussed management options and concerns about aggressive diuresis outpateint without labs results to guide management She sees the office Thursday AM as previously planned Discussed ER/hospital for IV diuresis if she felt our efforts to date had not noted any real improvement.  We decided that we would give the lab a bit more time to process the lab and go from there. Increased fatigue today certainly could be her Afib No new SOB, was comfortable in her recliner  Will follow up  Charlies Arthur, PA-C

## 2023-01-17 NOTE — Telephone Encounter (Signed)
 No labs result yet Spoke with the patient's daughter Olam. Advised to take a single dose of her lasix  today Elevate feel when seated Monitor for worsening edema, or escalating SOB/DOE, if so to go to the hospital  Protocol is that if there is any critical lab result lab will notify on call for management. I will follow up tomorrow, she is aware  Charlies Arthur, PA-C

## 2023-01-18 ENCOUNTER — Other Ambulatory Visit: Payer: Self-pay | Admitting: Physician Assistant

## 2023-01-18 MED ORDER — POTASSIUM CHLORIDE CRYS ER 10 MEQ PO TBCR: 10.0000 meq | EXTENDED_RELEASE_TABLET | Freq: Every day | ORAL | 2 refills | Status: DC

## 2023-01-18 MED ORDER — FUROSEMIDE 40 MG PO TABS: 40.0000 mg | ORAL_TABLET | Freq: Two times a day (BID) | ORAL | 2 refills | Status: DC

## 2023-01-18 NOTE — Progress Notes (Addendum)
 Electrophysiology Office Note:   Date:  01/19/2023  ID:  Danielle Colon, DOB Jun 07, 1930, MRN [id number]  Primary Cardiologist: Redell Cave, MD Primary Heart Failure: None Electrophysiologist: Elspeth Sage, MD       History of Present Illness:   Danielle Colon is a 89 y.o. female with h/o tachy-brady s/p PPM, AF, HTN, HLD, TIA, CKD IIIa, TIA, Bell's Palsey seen today for followup.   Seen in EP Clinic 12/13/22 with progressive LE edema, AF burden of 65% and was started on flecainide .  She followed up in EP Clinic 01/13/23 with ongoing edema, DOE.  Her diuretics were adjusted - initially 20 mg TID x2 days, then BID, then 40 mg BID with addition of KCL 10 mEq daily.    Since last being seen in our clinic the patient reports she feels her legs have gone down significantly.  Her daughter discovered the patient accidentally got her furosemide  / flecainide  mixed up thinking they were the same medication and had put the flecainide  away as extra. She is unsure how long this was occurring. Reports her RLE is always more swollen than her left. She has not missed doses of anticoagulation.   She denies chest pain, palpitations, dyspnea, PND, orthopnea, nausea, vomiting, dizziness, syncope, or early satiety.   Review of systems complete and found to be negative unless listed in HPI.   EP Information / Studies Reviewed:    EKG is not ordered today. EKG from 01/13/23 reviewed which showed AF with intermittent VP, 80 bpm      PPM Interrogation-  reviewed in detail today,  See PACEART report.  Device History: Medtronic Dual Chamber PPM implanted 09/27/19 for Tachy-Brady syndrome  Studies:  ECHO 08/2022 > LVEF 60-65%, no RWMA, LA severely dilated   Arrhythmia / AAD AF  Flecainide  > initiated 11/2022  Risk Assessment/Calculations:    CHA2DS2-VASc Score = 6   This indicates a 9.7% annual risk of stroke. The patient's score is based upon: CHF History: 0 HTN History: 1 Diabetes History:  0 Stroke History: 2 Vascular Disease History: 0 Age Score: 2 Gender Score: 1     Physical Exam:   VS:  BP (!) 150/68 (BP Location: Right Arm, Patient Position: Sitting, Cuff Size: Normal)   Pulse 84   Ht 5' 1 (1.549 m)   Wt 143 lb 3.2 oz (65 kg)   SpO2 97%   BMI 27.06 kg/m    Wt Readings from Last 3 Encounters:  01/19/23 143 lb 3.2 oz (65 kg)  01/13/23 154 lb 9.6 oz (70.1 kg)  12/13/22 146 lb 3.2 oz (66.3 kg)     GEN: Well nourished, well developed in no acute distress NECK: No JVD; No carotid bruits CARDIAC: irr irr rate and rhythm, no murmurs, rubs, gallops RESPIRATORY:  Clear to auscultation without rales, wheezing or rhonchi  ABDOMEN: Soft, non-tender, non-distended EXTREMITIES:  No edema; No deformity   ASSESSMENT AND PLAN:    Tachy-Brady syndrome s/p Medtronic PPM  -Normal PPM function -See Pace Art report -No changes today -limited device check in clinic given recent check > ~30% AF since 01/13/23   LE Edema, Orthopnea  LVEF 60-65% -pt previously run out of lasix  for several days prior to last visit, AF thought contributing to volume overload -weight down significantly.  Last weights: 11/15 156 lbs, 11/26 146 lbs, 12/27 154lbs > 143.2lbs at visit 01/19/23 -continue KCL as previously ordered  -given >10lb weight change with diuresis, reduce lasix  to 40mg  AM, 20mg  PM with follow  up labs in one week -compression stockings encouraged    Paroxysmal AF  CHA2DS2-VASc 5.  -instructed patient to resume flecainide  (no missed OAC doses, confirmed with daughter) -add Toprol  25mg  at bedtime for rate control  -continue diltiazem  -bring back in one week for EKG, BMP   Secondary Hypercoagulable State  -continue Eliquis  5mg  BID, dose reviewed and appropriate by wt/cr -repeat BMP in one week as above, she is teetering on line of needing dose reduction with wt/cr  Disposition:   Follow up with EP APP in 4 weeks  Signed, Daphne Barrack, MSN, APRN, NP-C, AGACNP-BC [facility] - Electrophysiology  01/19/2023, 3:01 PM

## 2023-01-18 NOTE — Progress Notes (Signed)
 Labs reviewed Spoke with Olam the patient's daughter Last evening post lasix  dose they did note a brisk increase in urine output, and today she feels is a little better Will continue to advance lasix  > 40mg  BID Add K+ 10meq daily She will see EP APP tomorrow as planned  Charlies Arthur, PA-C

## 2023-01-19 ENCOUNTER — Ambulatory Visit: Payer: PPO | Attending: Pulmonary Disease | Admitting: Pulmonary Disease

## 2023-01-19 ENCOUNTER — Encounter: Payer: Self-pay | Admitting: Pulmonary Disease

## 2023-01-19 VITALS — BP 150/68 | HR 84 | Ht 61.0 in | Wt 143.2 lb

## 2023-01-19 DIAGNOSIS — Z950 Presence of cardiac pacemaker: Secondary | ICD-10-CM | POA: Diagnosis not present

## 2023-01-19 DIAGNOSIS — I495 Sick sinus syndrome: Secondary | ICD-10-CM | POA: Diagnosis not present

## 2023-01-19 DIAGNOSIS — I480 Paroxysmal atrial fibrillation: Secondary | ICD-10-CM | POA: Diagnosis not present

## 2023-01-19 DIAGNOSIS — D6869 Other thrombophilia: Secondary | ICD-10-CM

## 2023-01-19 LAB — CUP PACEART INCLINIC DEVICE CHECK
Battery Remaining Longevity: 134 mo
Battery Voltage: 3.02 V
Brady Statistic AP VP Percent: 0.28 %
Brady Statistic AP VS Percent: 94.86 %
Brady Statistic AS VP Percent: 0.01 %
Brady Statistic AS VS Percent: 4.91 %
Brady Statistic RA Percent Paced: 67.58 %
Brady Statistic RV Percent Paced: 5.92 %
Date Time Interrogation Session: 20250102150510
Implantable Lead Connection Status: 753985
Implantable Lead Connection Status: 753985
Implantable Lead Implant Date: 20210910
Implantable Lead Implant Date: 20210910
Implantable Lead Location: 753859
Implantable Lead Location: 753860
Implantable Lead Model: 5076
Implantable Lead Model: 5076
Implantable Pulse Generator Implant Date: 20210910
Lead Channel Impedance Value: 323 Ohm
Lead Channel Impedance Value: 418 Ohm
Lead Channel Impedance Value: 456 Ohm
Lead Channel Impedance Value: 494 Ohm
Lead Channel Pacing Threshold Amplitude: 0.625 V
Lead Channel Pacing Threshold Amplitude: 0.875 V
Lead Channel Pacing Threshold Pulse Width: 0.4 ms
Lead Channel Pacing Threshold Pulse Width: 0.4 ms
Lead Channel Sensing Intrinsic Amplitude: 0.625 mV
Lead Channel Sensing Intrinsic Amplitude: 0.625 mV
Lead Channel Sensing Intrinsic Amplitude: 6.125 mV
Lead Channel Sensing Intrinsic Amplitude: 6.125 mV
Lead Channel Setting Pacing Amplitude: 1.5 V
Lead Channel Setting Pacing Amplitude: 2.5 V
Lead Channel Setting Pacing Pulse Width: 0.4 ms
Lead Channel Setting Sensing Sensitivity: 1.2 mV
Zone Setting Status: 755011
Zone Setting Status: 755011

## 2023-01-19 MED ORDER — METOPROLOL SUCCINATE ER 25 MG PO TB24: 25.0000 mg | ORAL_TABLET | Freq: Every day | ORAL | 3 refills | Status: DC

## 2023-01-19 MED ORDER — FUROSEMIDE 40 MG PO TABS: 60.0000 mg | ORAL_TABLET | Freq: Every day | ORAL | 3 refills | Status: DC

## 2023-01-19 NOTE — Patient Instructions (Addendum)
  FOR COPIES OF DEVICE CARDS MED [facility]ONIC [phone]    Medication Instructions:   START TAKING : FUROSEMIDE  40 MG IN THE AM AND 20 MG IN THE PM   START TAKING: TOPROL  XL-25 MG ONCE A DAY ( IN THE EVENING AT BEDTIME )    *If you need a refill on your cardiac medications before your next appointment, please call your pharmacy*   Lab Work:  PLEASE GO DOWN STAIRS FIRST FLOOR  [address] :  RETURN BMET  NEXT WEEK FRIDAY     If you have labs (blood work) drawn today and your tests are completely normal, you will receive your results only by: MyChart Message (if you have MyChart) OR A paper copy in the mail If you have any lab test that is abnormal or we need to change your treatment, we will call you to review the results.    Testing/Procedures: NONE ORDERED  TODAY      Follow-Up: At [facility], you and your health needs are our priority.  As part of our continuing mission to provide you with exceptional heart care, we have created designated Provider Care Teams.  These Care Teams include your primary Cardiologist (physician) and Advanced Practice Providers (APPs -  Physician Assistants and Nurse Practitioners) who all work together to provide you with the care you need, when you need it.  We recommend signing up for the patient portal called MyChart.  Sign up information is provided on this After Visit Summary.  MyChart is used to connect with patients for Virtual Visits (Telemedicine).  Patients are able to view lab/test results, encounter notes, upcoming appointments, etc.  Non-urgent messages can be sent to your provider as well.   To learn more about what you can do with MyChart, go to [email or web address].    Your next appointment:   NURSE VISIT NEXT WEEK   EKG.FOR ( MEDICATION MANAGEMENT) ( FRIDAY)  1 month(s)  ( CONTACT  CASSIE HALL/ ANGELINE HAMMER FOR EP SCHEDULING ISSUES )   Provider:    You may see Elspeth Sage, MD or one of the following Advanced  Practice Providers on your designated Care Team:   Charlies Arthur, PA-C Michael Andy Tillery, PA-C   Daphne Barrack, NP   Other Instructions

## 2023-01-26 ENCOUNTER — Ambulatory Visit: Payer: Medicare HMO | Attending: Cardiology

## 2023-01-26 VITALS — BP 122/62 | HR 64 | Ht 61.0 in | Wt 148.2 lb

## 2023-01-26 DIAGNOSIS — I480 Paroxysmal atrial fibrillation: Secondary | ICD-10-CM | POA: Diagnosis not present

## 2023-01-26 DIAGNOSIS — Z79899 Other long term (current) drug therapy: Secondary | ICD-10-CM

## 2023-01-26 DIAGNOSIS — I4891 Unspecified atrial fibrillation: Secondary | ICD-10-CM

## 2023-01-26 NOTE — Progress Notes (Signed)
   Nurse Visit   Date of Encounter: 01/26/2023 ID: Danielle Colon, DOB 08-Oct-1930, MRN [id number]  PCP:  Okey Carlin Redbird, MD   [facility] Providers Cardiologist:  Redell Cave, MD Electrophysiologist:  Elspeth Sage, MD      Visit Details   VS:  BP 122/62 (BP Location: Left Arm, Patient Position: Sitting, Cuff Size: Normal)   Pulse 64   Ht 5' 1 (1.549 m)   Wt 148 lb 3.2 oz (67.2 kg)   SpO2 98%   BMI 28.00 kg/m  , BMI Body mass index is 28 kg/m.  Wt Readings from Last 3 Encounters:  01/26/23 148 lb 3.2 oz (67.2 kg)  01/19/23 143 lb 3.2 oz (65 kg)  01/13/23 154 lb 9.6 oz (70.1 kg)     Reason for visit: EKG Performed today: Patient complaining about chest pressure at night when she lays down. Patient stated she feels fine during the day. Vitals, EKG, Provider consulted:Dr. Inocencio, DOD, and Education Changes (medications, testing, etc.) : Patient will increase her lasix  40 mg BID for 5 days and go back to taking lasix  40 in the morning and 20 mg in the evening.  Length of Visit: 30 minutes    Medications Adjustments/Labs and Tests Ordered: Orders Placed This Encounter  Procedures   EKG 12-Lead   No orders of the defined types were placed in this encounter.    Signed, Sharlet Drena Martinis, RN  01/26/2023 3:57 PM

## 2023-01-26 NOTE — Patient Instructions (Signed)
 Medication Instructions:  Your physician has recommended you make the following change in your medication:   For the next 5 days please take your Lasix  40 mg by mouth twice daily, then go back to taking lasix  40 mg in the morning and 20 mg by mouth in the evening every day.  *If you need a refill on your cardiac medications before your next appointment, please call your pharmacy*  Lab Work: If you have labs (blood work) drawn today and your tests are completely normal, you will receive your results only by: MyChart Message (if you have MyChart) OR A paper copy in the mail If you have any lab test that is abnormal or we need to change your treatment, we will call you to review the results.  Follow-Up: At [facility], you and your health needs are our priority.  As part of our continuing mission to provide you with exceptional heart care, we have created designated Provider Care Teams.  These Care Teams include your primary Cardiologist (physician) and Advanced Practice Providers (APPs -  Physician Assistants and Nurse Practitioners) who all work together to provide you with the care you need, when you need it.  We recommend signing up for the patient portal called MyChart.  Sign up information is provided on this After Visit Summary.  MyChart is used to connect with patients for Virtual Visits (Telemedicine).  Patients are able to view lab/test results, encounter notes, upcoming appointments, etc.  Non-urgent messages can be sent to your provider as well.   To learn more about what you can do with MyChart, go to [email or web address].    Your next appointment:   As instructed at previous visit  Provider:   You may see Elspeth Sage, MD or one of the following Advanced Practice Providers on your designated Care Team:   Charlies Arthur, PA-C Michael Andy Tillery, PA-C Suzann Riddle, NP Daphne Barrack, NP    Other Instructions

## 2023-01-27 LAB — BASIC METABOLIC PANEL
BUN/Creatinine Ratio: 18 (ref 12–28)
BUN: 24 mg/dL (ref 10–36)
CO2: 23 mmol/L (ref 20–29)
Calcium: 9.8 mg/dL (ref 8.7–10.3)
Chloride: 96 mmol/L (ref 96–106)
Creatinine, Ser: 1.32 mg/dL — ABNORMAL HIGH (ref 0.57–1.00)
Glucose: 125 mg/dL — ABNORMAL HIGH (ref 70–99)
Potassium: 3.8 mmol/L (ref 3.5–5.2)
Sodium: 137 mmol/L (ref 134–144)
eGFR: 38 mL/min/{1.73_m2} — ABNORMAL LOW (ref >59)

## 2023-01-30 NOTE — Telephone Encounter (Signed)
 Spoke with pt's daughter re pt continues to c/o weakness after speaking with daughter she did remember the suggestion of getting protein shakes and will try this and will call back if no improvement  Will forward to Daphne Barrack NP for review and any other recommendations

## 2023-02-02 ENCOUNTER — Encounter: Payer: Self-pay | Admitting: Internal Medicine

## 2023-02-15 DIAGNOSIS — R609 Edema, unspecified: Secondary | ICD-10-CM | POA: Diagnosis not present

## 2023-02-15 DIAGNOSIS — R413 Other amnesia: Secondary | ICD-10-CM | POA: Diagnosis not present

## 2023-02-15 DIAGNOSIS — Z658 Other specified problems related to psychosocial circumstances: Secondary | ICD-10-CM | POA: Diagnosis not present

## 2023-02-20 ENCOUNTER — Encounter: Payer: Self-pay | Admitting: Pulmonary Disease

## 2023-02-20 ENCOUNTER — Ambulatory Visit: Payer: Medicare HMO | Attending: Pulmonary Disease | Admitting: Pulmonary Disease

## 2023-02-20 ENCOUNTER — Other Ambulatory Visit: Payer: Self-pay | Admitting: *Deleted

## 2023-02-20 VITALS — BP 118/66 | HR 75 | Ht 61.0 in | Wt 164.4 lb

## 2023-02-20 DIAGNOSIS — Z79899 Other long term (current) drug therapy: Secondary | ICD-10-CM | POA: Diagnosis not present

## 2023-02-20 DIAGNOSIS — Z950 Presence of cardiac pacemaker: Secondary | ICD-10-CM

## 2023-02-20 NOTE — Progress Notes (Signed)
Electrophysiology Office Note:   Date:  02/20/2023  ID:  Danielle Colon, DOB 10/03/1930, MRN [id number]  Primary Cardiologist: Debbe Odea, MD Primary Heart Failure: None Electrophysiologist: Sherryl Manges, MD       History of Present Illness:   Danielle Colon is a 89 y.o. female with h/o tachy-brady s/p PPM, AF, HTN, HLD, TIA, CKD IIIa, TIA, Bell's Palsey seen today for routine electrophysiology followup.   She has been seen 12/13/22 and again 01/19/23 for LE edema and swelling. Her diuretics were transiently increased with improvement.   Since last being seen in our clinic the patient reports she is concerned about her LE swelling. She again is unsure of what medications she takes. Her daughter wrote a message during the visit "she has dementia".  Her daughter has been letting her take care of her medications.    She denies chest pain, palpitations, dyspnea, PND, orthopnea, nausea, vomiting, dizziness, syncope, edema, weight gain, or early satiety.   Review of systems complete and found to be negative unless listed in HPI.   EP Information / Studies Reviewed:    EKG is ordered today. Personal review as below.  EKG Interpretation Date/Time:  Monday February 20 2023 15:25:05 EST Ventricular Rate:  75 PR Interval:  176 QRS Duration:  166 QT Interval:  498 QTC Calculation: 556 R Axis:   -49  Text Interpretation: AV dual-paced rhythm Confirmed by Canary Brim ([id number]) on 02/20/2023 6:31:03 PM   PPM Interrogation-  reviewed in detail today,  See PACEART report.  Device History: Medtronic Dual Chamber PPM implanted 09/27/2019 for Tachy-Brady syndrome  Studies:  ECHO 08/2022 > LVEF 60-65%, no RWMA, LA severely dilated    Arrhythmia / AAD AF  Flecainide > initiated 11/2022  Risk Assessment/Calculations:    CHA2DS2-VASc Score = 6   This indicates a 9.7% annual risk of stroke. The patient's score is based upon: CHF History: 0 HTN History: 1 Diabetes History: 0 Stroke  History: 2 Vascular Disease History: 0 Age Score: 2 Gender Score: 1             Physical Exam:   VS:  BP 118/66   Pulse 75   Ht 5\' 1"  (1.549 m)   Wt 164 lb 6.4 oz (74.6 kg)   SpO2 96%   BMI 31.06 kg/m    Wt Readings from Last 3 Encounters:  02/20/23 164 lb 6.4 oz (74.6 kg)  01/26/23 148 lb 3.2 oz (67.2 kg)  01/19/23 143 lb 3.2 oz (65 kg)     GEN: elderly female in NAD, sitting in chair NECK: No JVD; No carotid bruits CARDIAC: Regular rate and rhythm, no murmurs, rubs, gallops RESPIRATORY:  Clear to auscultation without rales, wheezing or rhonchi  ABDOMEN: Soft, non-tender, non-distended EXTREMITIES:  LE 2-3+ pitting edema; No deformity   ASSESSMENT AND PLAN:    Tachy-Brady syndrome s/p Medtronic PPM  -Normal PPM function -See Pace Art report > APVS at 30 bpm, prolonged PR at 384 ms, appears EKG may have caught her at a time where she was pacing as she has not been by device, MVP and Rate Response on -No changes today  LE Edema, Orthopnea  LVEF 60-65%. Last weights: 11/15 156 lbs, 11/26 146 lbs, 12/27 154lbs > 143.2lbs 01/19/23 -encouraged compressions stockings > pt not wearing in clinic  -increase lasix to 40mg  BID x4 days, with KCL 40 mEq daily then back to baseline scheduling  -repeat BMP / Mg+ on Monday with follow up on Friday 2/14  Paroxysmal AF  CHA2DS2-VASc 5.  -continue flecainide, asked patient to bring all her pills / bottles with her to her next visit  -EKG today with SR, QRS widened with VP -stop Toprol   -continue home cardizem 180mg  BID    Secondary Hypercoagulable State  -continue Eliquis 5mg  BID, dose reviewed and appropriate by wt/cr -follow up Cr on next BMP   Disposition:   Follow up with EP APP  1 week    Signed, Canary Brim, NP-C, AGACNP-BC [facility] - Electrophysiology  02/20/2023, 6:39 PM

## 2023-02-20 NOTE — Patient Instructions (Signed)
Medication Instructions:  1.Increase furosemide (Lasix) to 40 mg twice a day for 4 days, then resume normal dosing thereafter 2.Increase potassium to 40 meq daily for 4 days, then resume normal dosing thereafter 3.Stop metoprolol *If you need a refill on your cardiac medications before your next appointment, please call your pharmacy*  Lab Work: BMET, MAG-on Monday You will need to go to any [facility] for these labs. There is a [facility] in our building on the 1st floor or you can call [phone] or visit [email or web address] to find a lab near you. - You do NOT need an appointment for this. You do NOT need to be fasting. We NO LONGER have a lab located in our office.   If you have labs (blood work) drawn today and your tests are completely normal, you will receive your results only by: MyChart Message (if you have MyChart) OR A paper copy in the mail If you have any lab test that is abnormal or we need to change your treatment, we will call you to review the results.  Follow-Up: At [facility], you and your health needs are our priority.  As part of our continuing mission to provide you with exceptional heart care, we have created designated Provider Care Teams.  These Care Teams include your primary Cardiologist (physician) and Advanced Practice Providers (APPs -  Physician Assistants and Nurse Practitioners) who all work together to provide you with the care you need, when you need it.  Your next appointment:   03/03/23 at 1:55 PM  Provider:   Canary Brim, NP

## 2023-02-22 ENCOUNTER — Other Ambulatory Visit: Payer: Self-pay | Admitting: Physician Assistant

## 2023-02-27 DIAGNOSIS — Z79899 Other long term (current) drug therapy: Secondary | ICD-10-CM | POA: Diagnosis not present

## 2023-02-27 DIAGNOSIS — Z950 Presence of cardiac pacemaker: Secondary | ICD-10-CM | POA: Diagnosis not present

## 2023-02-28 LAB — MAGNESIUM: Magnesium: 2 mg/dL (ref 1.6–2.3)

## 2023-02-28 LAB — BASIC METABOLIC PANEL
BUN/Creatinine Ratio: 24 (ref 12–28)
BUN: 30 mg/dL (ref 10–36)
CO2: 25 mmol/L (ref 20–29)
Calcium: 9.5 mg/dL (ref 8.7–10.3)
Chloride: 96 mmol/L (ref 96–106)
Creatinine, Ser: 1.23 mg/dL — ABNORMAL HIGH (ref 0.57–1.00)
Glucose: 91 mg/dL (ref 70–99)
Potassium: 3.9 mmol/L (ref 3.5–5.2)
Sodium: 136 mmol/L (ref 134–144)
eGFR: 42 mL/min/{1.73_m2} — ABNORMAL LOW (ref >59)

## 2023-03-02 NOTE — Progress Notes (Unsigned)
Electrophysiology Office Note:   Date:  03/03/2023  ID:  LASHAWNA POCHE, DOB [birth year], MRN [id number]  Primary Cardiologist: Debbe Odea, MD Primary Heart Failure: None Electrophysiologist: Sherryl Manges, MD       History of Present Illness:   KACY HEGNA is a 89 y.o. female with h/o tachy-brady s/p PPM, AF, HTN, HLD, TIA, CKD IIIa, TIA, Bell's Palsey seen today for routine electrophysiology followup..   Since last being seen in our clinic the patient reports doing very well. She is living with her youngest daughter and she has taken over her medications. The patient now is wearing compression stockings for the first time in clinic.  She denies chest pain, palpitations, dyspnea, PND, orthopnea, nausea, vomiting, dizziness, syncope, edema, weight gain, or early satiety.   Review of systems complete and found to be negative unless listed in HPI.    EP Information / Studies Reviewed:    EKG is ordered today. Personal review as below.  EKG Interpretation Date/Time:  Friday March 03 2023 13:55:54 EST Ventricular Rate:  72 PR Interval:  332 QRS Duration:  100 QT Interval:  420 QTC Calculation: 459 R Axis:   257  Text Interpretation: Atrial-paced rhythm with prolonged AV conduction Confirmed by Canary Brim ([id number]) on 03/03/2023 2:10:07 PM   PPM Interrogation-  reviewed in detail today,  See PACEART report.  Device History: Medtronic Dual Chamber PPM implanted 09/27/2019 for Tachy-Brady syndrome  Studies:  ECHO 08/2022 > LVEF 60-65%, no RWMA, LA severely dilated     Arrhythmia / AAD AF  Flecainide > initiated 11/2022    Risk Assessment/Calculations:    CHA2DS2-VASc Score = 6   This indicates a 9.7% annual risk of stroke. The patient's score is based upon: CHF History: 0 HTN History: 1 Diabetes History: 0 Stroke History: 2 Vascular Disease History: 0 Age Score: 2 Gender Score: 1         Physical Exam:   VS:  BP (!) 140/72   Pulse 72   Ht 5\' 1"   (1.549 m)   Wt 146 lb 12.8 oz (66.6 kg)   SpO2 99%   BMI 27.74 kg/m    Wt Readings from Last 3 Encounters:  03/03/23 146 lb 12.8 oz (66.6 kg)  02/20/23 164 lb 6.4 oz (74.6 kg)  01/26/23 148 lb 3.2 oz (67.2 kg)     GEN: Well nourished, well developed in no acute distress NECK: No JVD; No carotid bruits CARDIAC: Regular rate and rhythm, no murmurs, rubs, gallops RESPIRATORY:  Clear to auscultation without rales, wheezing or rhonchi  ABDOMEN: Soft, non-tender, non-distended EXTREMITIES:  No edema; No deformity   ASSESSMENT AND PLAN:    Tachy-Brady syndrome s/p Medtronic PPM  -pacemaker check last week wnl  -device not interrogated for visit    LE Edema, Orthopnea  LVEF 60-65%. Last weights: 11/15 156 lbs, 11/26 146 lbs, 12/27 154lbs > 143.2lbs 01/19/23 > 2/14 146 lbs -compression stockings  -post increase / pulse dose lasix > improved and Cr stable.  Back on 40 mg am, 20 mg PM -labs 2/10 stable    Paroxysmal AF  CHA2DS2-VASc 5.  -continue flecainide 50 mg BID  -EKG on flecainide with stable prolonged PR, QRS   -toprol stopped at last visit due to increased VP -continue cardizem 180 mg BID   Secondary Hypercoagulable State  -Eliquis 5mg  BID, appropriate dosing  -Cr stable  Hypertension  -reasonable control, no med changes today  Disposition:   Follow up with Dr. Graciela Husbands or EP  APP in 3 months  Signed, Canary Brim, NP-C, AGACNP-BC [facility] - Electrophysiology  03/03/2023, 2:24 PM

## 2023-03-03 ENCOUNTER — Ambulatory Visit: Payer: Medicare HMO | Attending: Pulmonary Disease | Admitting: Pulmonary Disease

## 2023-03-03 ENCOUNTER — Encounter: Payer: Self-pay | Admitting: Pulmonary Disease

## 2023-03-03 VITALS — BP 140/72 | HR 72 | Ht 61.0 in | Wt 146.8 lb

## 2023-03-03 DIAGNOSIS — I495 Sick sinus syndrome: Secondary | ICD-10-CM

## 2023-03-03 DIAGNOSIS — Z950 Presence of cardiac pacemaker: Secondary | ICD-10-CM

## 2023-03-03 DIAGNOSIS — I10 Essential (primary) hypertension: Secondary | ICD-10-CM

## 2023-03-03 DIAGNOSIS — D6869 Other thrombophilia: Secondary | ICD-10-CM | POA: Diagnosis not present

## 2023-03-03 DIAGNOSIS — I480 Paroxysmal atrial fibrillation: Secondary | ICD-10-CM

## 2023-03-03 NOTE — Patient Instructions (Signed)
Medication Instructions:   Your physician recommends that you continue on your current medications as directed. Please refer to the Current Medication list given to you today.   *If you need a refill on your cardiac medications before your next appointment, please call your pharmacy*   Lab Work: NONE ORDERED  TODAY    If you have labs (blood work) drawn today and your tests are completely normal, you will receive your results only by: MyChart Message (if you have MyChart) OR A paper copy in the mail If you have any lab test that is abnormal or we need to change your treatment, we will call you to review the results.   Testing/Procedures:  NONE ORDERED  TODAY   Follow-Up: At [facility], you and your health needs are our priority.  As part of our continuing mission to provide you with exceptional heart care, we have created designated Provider Care Teams.  These Care Teams include your primary Cardiologist (physician) and Advanced Practice Providers (APPs -  Physician Assistants and Nurse Practitioners) who all work together to provide you with the care you need, when you need it.  We recommend signing up for the patient portal called "MyChart".  Sign up information is provided on this After Visit Summary.  MyChart is used to connect with patients for Virtual Visits (Telemedicine).  Patients are able to view lab/test results, encounter notes, upcoming appointments, etc.  Non-urgent messages can be sent to your provider as well.   To learn more about what you can do with MyChart, go to [email or web address].    Your next appointment:    3 month(s) ( CONTACT  CASSIE HALL/ ANGELINE HAMMER FOR EP SCHEDULING ISSUES )   Provider:   Canary Brim, NP    Other Instructions    1st Floor: - Lobby - Registration  - Pharmacy  - Lab - Cafe  2nd Floor: - PV Lab - Diagnostic Testing (echo, CT, nuclear med)  3rd Floor: - Vacant  4th Floor: - TCTS (cardiothoracic  surgery) - AFib Clinic - Structural Heart Clinic - Vascular Surgery  - Vascular Ultrasound  5th Floor: - HeartCare Cardiology (general and EP) - Clinical Pharmacy for coumadin, hypertension, lipid, weight-loss medications, and med management appointments    Valet parking services will be available as well.

## 2023-03-24 ENCOUNTER — Ambulatory Visit: Payer: Medicare HMO

## 2023-03-24 DIAGNOSIS — I495 Sick sinus syndrome: Secondary | ICD-10-CM

## 2023-03-26 LAB — CUP PACEART REMOTE DEVICE CHECK
Battery Remaining Longevity: 130 mo
Battery Voltage: 3.02 V
Brady Statistic AP VP Percent: 0.32 %
Brady Statistic AP VS Percent: 97.77 %
Brady Statistic AS VP Percent: 0 %
Brady Statistic AS VS Percent: 1.91 %
Brady Statistic RA Percent Paced: 98.09 %
Brady Statistic RV Percent Paced: 0.32 %
Date Time Interrogation Session: 20250307052140
Implantable Lead Connection Status: 753985
Implantable Lead Connection Status: 753985
Implantable Lead Implant Date: 20210910
Implantable Lead Implant Date: 20210910
Implantable Lead Location: 753859
Implantable Lead Location: 753860
Implantable Lead Model: 5076
Implantable Lead Model: 5076
Implantable Pulse Generator Implant Date: 20210910
Lead Channel Impedance Value: 304 Ohm
Lead Channel Impedance Value: 361 Ohm
Lead Channel Impedance Value: 399 Ohm
Lead Channel Impedance Value: 437 Ohm
Lead Channel Pacing Threshold Amplitude: 0.625 V
Lead Channel Pacing Threshold Amplitude: 0.75 V
Lead Channel Pacing Threshold Pulse Width: 0.4 ms
Lead Channel Pacing Threshold Pulse Width: 0.4 ms
Lead Channel Sensing Intrinsic Amplitude: 1.375 mV
Lead Channel Sensing Intrinsic Amplitude: 1.375 mV
Lead Channel Sensing Intrinsic Amplitude: 6.5 mV
Lead Channel Sensing Intrinsic Amplitude: 6.5 mV
Lead Channel Setting Pacing Amplitude: 1.5 V
Lead Channel Setting Pacing Amplitude: 2.5 V
Lead Channel Setting Pacing Pulse Width: 0.4 ms
Lead Channel Setting Sensing Sensitivity: 1.2 mV
Zone Setting Status: 755011
Zone Setting Status: 755011

## 2023-04-25 NOTE — Addendum Note (Signed)
 Addended by: Elease Etienne A on: 04/25/2023 10:10 AM   Modules accepted: Orders

## 2023-04-25 NOTE — Progress Notes (Signed)
 Remote pacemaker transmission.

## 2023-04-29 ENCOUNTER — Encounter: Payer: Self-pay | Admitting: Internal Medicine

## 2023-05-01 DIAGNOSIS — J069 Acute upper respiratory infection, unspecified: Secondary | ICD-10-CM | POA: Diagnosis not present

## 2023-05-12 ENCOUNTER — Telehealth: Payer: Self-pay

## 2023-05-12 NOTE — Telephone Encounter (Signed)
 Bmet lab req was left at the front and will be discarded since patient has not put it up from our office.

## 2023-05-25 DIAGNOSIS — K644 Residual hemorrhoidal skin tags: Secondary | ICD-10-CM | POA: Diagnosis not present

## 2023-05-25 DIAGNOSIS — Z6829 Body mass index (BMI) 29.0-29.9, adult: Secondary | ICD-10-CM | POA: Diagnosis not present

## 2023-05-29 DIAGNOSIS — Z79899 Other long term (current) drug therapy: Secondary | ICD-10-CM | POA: Diagnosis not present

## 2023-05-30 ENCOUNTER — Ambulatory Visit: Payer: Self-pay | Admitting: *Deleted

## 2023-05-30 LAB — BASIC METABOLIC PANEL WITH GFR
BUN/Creatinine Ratio: 12 (ref 12–28)
BUN: 16 mg/dL (ref 10–36)
CO2: 24 mmol/L (ref 20–29)
Calcium: 9.8 mg/dL (ref 8.7–10.3)
Chloride: 99 mmol/L (ref 96–106)
Creatinine, Ser: 1.31 mg/dL — ABNORMAL HIGH (ref 0.57–1.00)
Glucose: 107 mg/dL — ABNORMAL HIGH (ref 70–99)
Potassium: 4 mmol/L (ref 3.5–5.2)
Sodium: 141 mmol/L (ref 134–144)
eGFR: 38 mL/min/{1.73_m2} — ABNORMAL LOW (ref >59)

## 2023-06-02 ENCOUNTER — Ambulatory Visit: Payer: Medicare HMO | Attending: Pulmonary Disease | Admitting: Pulmonary Disease

## 2023-06-02 ENCOUNTER — Encounter: Payer: Self-pay | Admitting: Pulmonary Disease

## 2023-06-02 VITALS — BP 118/62 | HR 78 | Ht 61.0 in | Wt 147.8 lb

## 2023-06-02 DIAGNOSIS — I10 Essential (primary) hypertension: Secondary | ICD-10-CM | POA: Diagnosis not present

## 2023-06-02 DIAGNOSIS — I495 Sick sinus syndrome: Secondary | ICD-10-CM

## 2023-06-02 DIAGNOSIS — I480 Paroxysmal atrial fibrillation: Secondary | ICD-10-CM

## 2023-06-02 DIAGNOSIS — E7800 Pure hypercholesterolemia, unspecified: Secondary | ICD-10-CM | POA: Diagnosis not present

## 2023-06-02 DIAGNOSIS — R454 Irritability and anger: Secondary | ICD-10-CM | POA: Diagnosis not present

## 2023-06-02 DIAGNOSIS — Z950 Presence of cardiac pacemaker: Secondary | ICD-10-CM

## 2023-06-02 DIAGNOSIS — R609 Edema, unspecified: Secondary | ICD-10-CM | POA: Diagnosis not present

## 2023-06-02 DIAGNOSIS — Z0000 Encounter for general adult medical examination without abnormal findings: Secondary | ICD-10-CM | POA: Diagnosis not present

## 2023-06-02 DIAGNOSIS — D6869 Other thrombophilia: Secondary | ICD-10-CM

## 2023-06-02 DIAGNOSIS — R899 Unspecified abnormal finding in specimens from other organs, systems and tissues: Secondary | ICD-10-CM | POA: Diagnosis not present

## 2023-06-02 DIAGNOSIS — K8689 Other specified diseases of pancreas: Secondary | ICD-10-CM | POA: Diagnosis not present

## 2023-06-02 DIAGNOSIS — N183 Chronic kidney disease, stage 3 unspecified: Secondary | ICD-10-CM | POA: Diagnosis not present

## 2023-06-02 DIAGNOSIS — Z6830 Body mass index (BMI) 30.0-30.9, adult: Secondary | ICD-10-CM | POA: Diagnosis not present

## 2023-06-02 NOTE — Progress Notes (Signed)
 Electrophysiology Office Note:   Date:  06/02/2023  ID:  Danielle Colon, DOB 1930-12-30, MRN [id number]  Primary Cardiologist: Constancia Delton, MD Primary Heart Failure: None Electrophysiologist: Richardo Chandler, MD       History of Present Illness:   Danielle Colon is a 89 y.o. female with h/o  tachy-brady s/p PPM, AF, HTN, HLD, TIA, CKD IIIa, TIA, Bell's Palsy seen today for routine electrophysiology followup.   Seen in EP Clinic 03/03/23 and her daughter had taken over her medications (after a flecainide  mix up) and patient was doing well / stable.    Since last being seen in our clinic the patient reports she has been doing very well.  She was able to go to the beach with her daughter.  Unfortunately after traveling to the beach she developed a cold but has recovered.  Her daughter took over her medications and the patient has been doing significantly better.  No acute device concerns.  She reports lower extremity edema is stable to improved.  She denies chest pain, palpitations, dyspnea, PND, orthopnea, nausea, vomiting, dizziness, syncope, weight gain, or early satiety.   Review of systems complete and found to be negative unless listed in HPI.   EP Information / Studies Reviewed:    EKG is ordered today. Personal review as below.  EKG Interpretation Date/Time:  Friday Jun 02 2023 14:05:06 EDT Ventricular Rate:  62 PR Interval:  318 QRS Duration:  94 QT Interval:  452 QTC Calculation: 458 R Axis:   -61  Text Interpretation: Atrial-paced rhythm with prolonged AV conduction Confirmed by Creighton Doffing ([id number]) on 06/02/2023 2:08:04 PM   PPM Interrogation-  reviewed in detail today,  See PACEART report.  Device History: Medtronic Dual Chamber PPM implanted 09/27/2019 for Tachy-Brady syndrome  Studies:  ECHO 08/2022 > LVEF 60-65%, no RWMA, LA severely dilated     Arrhythmia / AAD AF  Flecainide  > initiated 11/2022   Risk Assessment/Calculations:    CHA2DS2-VASc Score = 6    This indicates a 9.7% annual risk of stroke. The patient's score is based upon: CHF History: 0 HTN History: 1 Diabetes History: 0 Stroke History: 2 Vascular Disease History: 0 Age Score: 2 Gender Score: 1             Physical Exam:   VS:  BP 118/62   Pulse 78   Ht 5\' 1"  (1.549 m)   Wt 147 lb 12.8 oz (67 kg)   SpO2 95%   BMI 27.93 kg/m    Wt Readings from Last 3 Encounters:  06/02/23 147 lb 12.8 oz (67 kg)  03/03/23 146 lb 12.8 oz (66.6 kg)  02/20/23 164 lb 6.4 oz (74.6 kg)     GEN: Well nourished, well developed in no acute distress NECK: No JVD; No carotid bruits CARDIAC: Regular rate and rhythm, no murmurs, rubs, gallops RESPIRATORY:  Clear to auscultation without rales, wheezing or rhonchi  ABDOMEN: Soft, non-tender, non-distended EXTREMITIES:  No edema; No deformity   ASSESSMENT AND PLAN:    Tachy-Brady syndrome s/p Medtronic PPM  -Normal PPM function -See Pace Art report -No changes today  Paroxysmal AF  CHA2DS2-VASc 6 -EKG with AP, prolonged PR 318 ms / stable   -continue flecainide  50 mg BID  -continue cardizem  180 mg BID  -reduced AF burden by device (noted after consistent medication use), <0.1% burden on device  Secondary Hypercoagulable State  -continue 5mg  BID, dose reviewed and appropriate by wt / cr   Chronic LE Edema, Orthopnea  LVEF 60-65%, Last weights: 11/15 156 lbs, 11/26 146 lbs, 12/27 154lbs > 143.2lbs 01/19/23 > 2/14 146 lbs > 5/15 147 lbs -continue compression stockings  -continue lasix  40 mg AM, 20 mg PM   Hypertension  -well controlled on current regimen     Disposition:   Follow up with EP APP in 6 months  Signed, Creighton Doffing, NP-C, AGACNP-BC [facility] - Electrophysiology  06/02/2023, 4:39 PM

## 2023-06-02 NOTE — Patient Instructions (Signed)
 Medication Instructions:  Your refills for the Flecainide  have been sent to your pharmacy with 6 months of refills. *If you need a refill on your cardiac medications before your next appointment, please call your pharmacy*   Lab Work: No labs were ordered during today's visit.  If you have labs (blood work) drawn today and your tests are completely normal, you will receive your results only by: MyChart Message (if you have MyChart) OR A paper copy in the mail If you have any lab test that is abnormal or we need to change your treatment, we will call you to review the results.   Testing/Procedures: No procedures were ordered during today's visit.    Follow-Up: At [facility], you and your health needs are our priority.  As part of our continuing mission to provide you with exceptional heart care, we have created designated Provider Care Teams.  These Care Teams include your primary Cardiologist (physician) and Advanced Practice Providers (APPs -  Physician Assistants and Nurse Practitioners) who all work together to provide you with the care you need, when you need it.  We recommend signing up for the patient portal called "MyChart".  Sign up information is provided on this After Visit Summary.  MyChart is used to connect with patients for Virtual Visits (Telemedicine).  Patients are able to view lab/test results, encounter notes, upcoming appointments, etc.  Non-urgent messages can be sent to your provider as well.   To learn more about what you can do with MyChart, go to [email or web address].    Your next appointment:   6 month(s)  Provider:   Creighton Doffing    Other Instructions Thank you for choosing [facility]!

## 2023-06-16 DIAGNOSIS — K8689 Other specified diseases of pancreas: Secondary | ICD-10-CM | POA: Diagnosis not present

## 2023-06-16 DIAGNOSIS — I10 Essential (primary) hypertension: Secondary | ICD-10-CM | POA: Diagnosis not present

## 2023-06-16 DIAGNOSIS — E7800 Pure hypercholesterolemia, unspecified: Secondary | ICD-10-CM | POA: Diagnosis not present

## 2023-06-16 DIAGNOSIS — R899 Unspecified abnormal finding in specimens from other organs, systems and tissues: Secondary | ICD-10-CM | POA: Diagnosis not present

## 2023-06-23 ENCOUNTER — Ambulatory Visit (INDEPENDENT_AMBULATORY_CARE_PROVIDER_SITE_OTHER): Payer: Medicare Other

## 2023-06-23 DIAGNOSIS — I495 Sick sinus syndrome: Secondary | ICD-10-CM | POA: Diagnosis not present

## 2023-06-23 LAB — CUP PACEART REMOTE DEVICE CHECK
Battery Remaining Longevity: 126 mo
Battery Voltage: 3.01 V
Brady Statistic AP VP Percent: 4.02 %
Brady Statistic AP VS Percent: 95.77 %
Brady Statistic AS VP Percent: 0 %
Brady Statistic AS VS Percent: 0.2 %
Brady Statistic RA Percent Paced: 99.81 %
Brady Statistic RV Percent Paced: 4.02 %
Date Time Interrogation Session: 20250606041857
Implantable Lead Connection Status: 753985
Implantable Lead Connection Status: 753985
Implantable Lead Implant Date: 20210910
Implantable Lead Implant Date: 20210910
Implantable Lead Location: 753859
Implantable Lead Location: 753860
Implantable Lead Model: 5076
Implantable Lead Model: 5076
Implantable Pulse Generator Implant Date: 20210910
Lead Channel Impedance Value: 285 Ohm
Lead Channel Impedance Value: 342 Ohm
Lead Channel Impedance Value: 399 Ohm
Lead Channel Impedance Value: 418 Ohm
Lead Channel Pacing Threshold Amplitude: 0.625 V
Lead Channel Pacing Threshold Amplitude: 0.75 V
Lead Channel Pacing Threshold Pulse Width: 0.4 ms
Lead Channel Pacing Threshold Pulse Width: 0.4 ms
Lead Channel Sensing Intrinsic Amplitude: 3 mV
Lead Channel Sensing Intrinsic Amplitude: 3 mV
Lead Channel Sensing Intrinsic Amplitude: 6.5 mV
Lead Channel Sensing Intrinsic Amplitude: 6.5 mV
Lead Channel Setting Pacing Amplitude: 1.5 V
Lead Channel Setting Pacing Amplitude: 2.5 V
Lead Channel Setting Pacing Pulse Width: 0.4 ms
Lead Channel Setting Sensing Sensitivity: 1.2 mV
Zone Setting Status: 755011
Zone Setting Status: 755011

## 2023-07-03 ENCOUNTER — Ambulatory Visit: Payer: Self-pay | Admitting: Cardiology

## 2023-08-08 NOTE — Progress Notes (Signed)
 Remote pacemaker transmission.

## 2023-08-26 DIAGNOSIS — M7989 Other specified soft tissue disorders: Secondary | ICD-10-CM | POA: Diagnosis not present

## 2023-09-22 ENCOUNTER — Ambulatory Visit (INDEPENDENT_AMBULATORY_CARE_PROVIDER_SITE_OTHER)

## 2023-09-22 DIAGNOSIS — I495 Sick sinus syndrome: Secondary | ICD-10-CM | POA: Diagnosis not present

## 2023-09-22 DIAGNOSIS — R42 Dizziness and giddiness: Secondary | ICD-10-CM | POA: Diagnosis not present

## 2023-09-22 DIAGNOSIS — S20222A Contusion of left back wall of thorax, initial encounter: Secondary | ICD-10-CM | POA: Diagnosis not present

## 2023-09-22 DIAGNOSIS — Z043 Encounter for examination and observation following other accident: Secondary | ICD-10-CM | POA: Diagnosis not present

## 2023-09-22 DIAGNOSIS — S7002XA Contusion of left hip, initial encounter: Secondary | ICD-10-CM | POA: Diagnosis not present

## 2023-09-22 DIAGNOSIS — W19XXXA Unspecified fall, initial encounter: Secondary | ICD-10-CM | POA: Diagnosis not present

## 2023-09-23 LAB — CUP PACEART REMOTE DEVICE CHECK
Battery Remaining Longevity: 124 mo
Battery Voltage: 3.01 V
Brady Statistic AP VP Percent: 1.38 %
Brady Statistic AP VS Percent: 97.81 %
Brady Statistic AS VP Percent: 0 %
Brady Statistic AS VS Percent: 0.8 %
Brady Statistic RA Percent Paced: 99.19 %
Brady Statistic RV Percent Paced: 1.39 %
Date Time Interrogation Session: 20250904194329
Implantable Lead Connection Status: 753985
Implantable Lead Connection Status: 753985
Implantable Lead Implant Date: 20210910
Implantable Lead Implant Date: 20210910
Implantable Lead Location: 753859
Implantable Lead Location: 753860
Implantable Lead Model: 5076
Implantable Lead Model: 5076
Implantable Pulse Generator Implant Date: 20210910
Lead Channel Impedance Value: 304 Ohm
Lead Channel Impedance Value: 361 Ohm
Lead Channel Impedance Value: 418 Ohm
Lead Channel Impedance Value: 456 Ohm
Lead Channel Pacing Threshold Amplitude: 0.5 V
Lead Channel Pacing Threshold Amplitude: 0.75 V
Lead Channel Pacing Threshold Pulse Width: 0.4 ms
Lead Channel Pacing Threshold Pulse Width: 0.4 ms
Lead Channel Sensing Intrinsic Amplitude: 0.625 mV
Lead Channel Sensing Intrinsic Amplitude: 0.625 mV
Lead Channel Sensing Intrinsic Amplitude: 6.875 mV
Lead Channel Sensing Intrinsic Amplitude: 6.875 mV
Lead Channel Setting Pacing Amplitude: 1.5 V
Lead Channel Setting Pacing Amplitude: 2.5 V
Lead Channel Setting Pacing Pulse Width: 0.4 ms
Lead Channel Setting Sensing Sensitivity: 1.2 mV
Zone Setting Status: 755011
Zone Setting Status: 755011

## 2023-09-24 ENCOUNTER — Ambulatory Visit: Payer: Self-pay | Admitting: Cardiology

## 2023-09-30 NOTE — Progress Notes (Signed)
 Remote PPM Transmission

## 2023-10-01 ENCOUNTER — Other Ambulatory Visit: Payer: Self-pay | Admitting: Internal Medicine

## 2023-11-25 DIAGNOSIS — Z809 Family history of malignant neoplasm, unspecified: Secondary | ICD-10-CM | POA: Diagnosis not present

## 2023-11-25 DIAGNOSIS — F329 Major depressive disorder, single episode, unspecified: Secondary | ICD-10-CM | POA: Diagnosis not present

## 2023-11-25 DIAGNOSIS — E876 Hypokalemia: Secondary | ICD-10-CM | POA: Diagnosis not present

## 2023-11-25 DIAGNOSIS — R32 Unspecified urinary incontinence: Secondary | ICD-10-CM | POA: Diagnosis not present

## 2023-11-25 DIAGNOSIS — Z8673 Personal history of transient ischemic attack (TIA), and cerebral infarction without residual deficits: Secondary | ICD-10-CM | POA: Diagnosis not present

## 2023-11-25 DIAGNOSIS — K219 Gastro-esophageal reflux disease without esophagitis: Secondary | ICD-10-CM | POA: Diagnosis not present

## 2023-11-25 DIAGNOSIS — I4891 Unspecified atrial fibrillation: Secondary | ICD-10-CM | POA: Diagnosis not present

## 2023-11-25 DIAGNOSIS — I495 Sick sinus syndrome: Secondary | ICD-10-CM | POA: Diagnosis not present

## 2023-11-25 DIAGNOSIS — F419 Anxiety disorder, unspecified: Secondary | ICD-10-CM | POA: Diagnosis not present

## 2023-11-25 DIAGNOSIS — Z7901 Long term (current) use of anticoagulants: Secondary | ICD-10-CM | POA: Diagnosis not present

## 2023-11-25 DIAGNOSIS — I130 Hypertensive heart and chronic kidney disease with heart failure and stage 1 through stage 4 chronic kidney disease, or unspecified chronic kidney disease: Secondary | ICD-10-CM | POA: Diagnosis not present

## 2023-11-25 DIAGNOSIS — Z950 Presence of cardiac pacemaker: Secondary | ICD-10-CM | POA: Diagnosis not present

## 2023-11-25 DIAGNOSIS — K861 Other chronic pancreatitis: Secondary | ICD-10-CM | POA: Diagnosis not present

## 2023-11-25 DIAGNOSIS — I509 Heart failure, unspecified: Secondary | ICD-10-CM | POA: Diagnosis not present

## 2023-11-25 DIAGNOSIS — D6869 Other thrombophilia: Secondary | ICD-10-CM | POA: Diagnosis not present

## 2023-11-25 DIAGNOSIS — N1832 Chronic kidney disease, stage 3b: Secondary | ICD-10-CM | POA: Diagnosis not present

## 2023-12-01 ENCOUNTER — Other Ambulatory Visit: Payer: Self-pay | Admitting: Physician Assistant

## 2023-12-04 MED ORDER — FUROSEMIDE 40 MG PO TABS: 60.0000 mg | ORAL_TABLET | Freq: Every day | ORAL | 1 refills | Status: AC

## 2023-12-18 DIAGNOSIS — Z6831 Body mass index (BMI) 31.0-31.9, adult: Secondary | ICD-10-CM | POA: Diagnosis not present

## 2023-12-18 DIAGNOSIS — R609 Edema, unspecified: Secondary | ICD-10-CM | POA: Diagnosis not present

## 2023-12-18 DIAGNOSIS — Z8719 Personal history of other diseases of the digestive system: Secondary | ICD-10-CM | POA: Diagnosis not present

## 2023-12-18 DIAGNOSIS — K5901 Slow transit constipation: Secondary | ICD-10-CM | POA: Diagnosis not present

## 2023-12-22 ENCOUNTER — Ambulatory Visit

## 2023-12-22 DIAGNOSIS — I495 Sick sinus syndrome: Secondary | ICD-10-CM | POA: Diagnosis not present

## 2023-12-25 LAB — CUP PACEART REMOTE DEVICE CHECK
Battery Remaining Longevity: 120 mo
Battery Voltage: 3.01 V
Brady Statistic AP VP Percent: 0.55 %
Brady Statistic AP VS Percent: 91.99 %
Brady Statistic AS VP Percent: 0.01 %
Brady Statistic AS VS Percent: 7.45 %
Brady Statistic RA Percent Paced: 92.56 %
Brady Statistic RV Percent Paced: 0.56 %
Date Time Interrogation Session: 20251204184808
Implantable Lead Connection Status: 753985
Implantable Lead Connection Status: 753985
Implantable Lead Implant Date: 20210910
Implantable Lead Implant Date: 20210910
Implantable Lead Location: 753859
Implantable Lead Location: 753860
Implantable Lead Model: 5076
Implantable Lead Model: 5076
Implantable Pulse Generator Implant Date: 20210910
Lead Channel Impedance Value: 323 Ohm
Lead Channel Impedance Value: 437 Ohm
Lead Channel Impedance Value: 456 Ohm
Lead Channel Impedance Value: 475 Ohm
Lead Channel Pacing Threshold Amplitude: 0.5 V
Lead Channel Pacing Threshold Amplitude: 0.625 V
Lead Channel Pacing Threshold Pulse Width: 0.4 ms
Lead Channel Pacing Threshold Pulse Width: 0.4 ms
Lead Channel Sensing Intrinsic Amplitude: 0.875 mV
Lead Channel Sensing Intrinsic Amplitude: 0.875 mV
Lead Channel Sensing Intrinsic Amplitude: 7.125 mV
Lead Channel Sensing Intrinsic Amplitude: 7.125 mV
Lead Channel Setting Pacing Amplitude: 1.5 V
Lead Channel Setting Pacing Amplitude: 2.5 V
Lead Channel Setting Pacing Pulse Width: 0.4 ms
Lead Channel Setting Sensing Sensitivity: 1.2 mV
Zone Setting Status: 755011
Zone Setting Status: 755011

## 2023-12-26 ENCOUNTER — Ambulatory Visit: Payer: Self-pay | Admitting: Cardiology

## 2023-12-26 NOTE — Progress Notes (Signed)
 Remote PPM Transmission

## 2024-01-28 ENCOUNTER — Other Ambulatory Visit: Payer: Self-pay

## 2024-01-28 ENCOUNTER — Emergency Department (HOSPITAL_BASED_OUTPATIENT_CLINIC_OR_DEPARTMENT_OTHER)
Admission: EM | Admit: 2024-01-28 | Discharge: 2024-01-28 | Disposition: A | Discharge status: Home / Self Care | Attending: Emergency Medicine | Admitting: Emergency Medicine

## 2024-01-28 ENCOUNTER — Encounter (HOSPITAL_BASED_OUTPATIENT_CLINIC_OR_DEPARTMENT_OTHER): Payer: Self-pay

## 2024-01-28 ENCOUNTER — Ambulatory Visit: Admission: EM | Admit: 2024-01-28 | Discharge: 2024-01-28 | Disposition: A | Discharge status: Home / Self Care

## 2024-01-28 DIAGNOSIS — K5901 Slow transit constipation: Secondary | ICD-10-CM | POA: Insufficient documentation

## 2024-01-28 DIAGNOSIS — K625 Hemorrhage of anus and rectum: Secondary | ICD-10-CM | POA: Diagnosis present

## 2024-01-28 DIAGNOSIS — I4891 Unspecified atrial fibrillation: Secondary | ICD-10-CM | POA: Insufficient documentation

## 2024-01-28 DIAGNOSIS — K648 Other hemorrhoids: Secondary | ICD-10-CM | POA: Diagnosis not present

## 2024-01-28 DIAGNOSIS — Z7901 Long term (current) use of anticoagulants: Secondary | ICD-10-CM | POA: Insufficient documentation

## 2024-01-28 DIAGNOSIS — K644 Residual hemorrhoidal skin tags: Secondary | ICD-10-CM | POA: Insufficient documentation

## 2024-01-28 DIAGNOSIS — G3184 Mild cognitive impairment, so stated: Secondary | ICD-10-CM | POA: Insufficient documentation

## 2024-01-28 DIAGNOSIS — Z87898 Personal history of other specified conditions: Secondary | ICD-10-CM | POA: Insufficient documentation

## 2024-01-28 DIAGNOSIS — Z8719 Personal history of other diseases of the digestive system: Secondary | ICD-10-CM | POA: Insufficient documentation

## 2024-01-28 DIAGNOSIS — R609 Edema, unspecified: Secondary | ICD-10-CM | POA: Insufficient documentation

## 2024-01-28 DIAGNOSIS — R454 Irritability and anger: Secondary | ICD-10-CM | POA: Insufficient documentation

## 2024-01-28 DIAGNOSIS — Z8673 Personal history of transient ischemic attack (TIA), and cerebral infarction without residual deficits: Secondary | ICD-10-CM | POA: Insufficient documentation

## 2024-01-28 DIAGNOSIS — R748 Abnormal levels of other serum enzymes: Secondary | ICD-10-CM | POA: Insufficient documentation

## 2024-01-28 DIAGNOSIS — D6859 Other primary thrombophilia: Secondary | ICD-10-CM | POA: Insufficient documentation

## 2024-01-28 DIAGNOSIS — M19049 Primary osteoarthritis, unspecified hand: Secondary | ICD-10-CM | POA: Insufficient documentation

## 2024-01-28 LAB — CBC
HCT: 37.5 % (ref 36.0–46.0)
Hemoglobin: 12.9 g/dL (ref 12.0–15.0)
MCH: 31.8 pg (ref 26.0–34.0)
MCHC: 34.4 g/dL (ref 30.0–36.0)
MCV: 92.4 fL (ref 80.0–100.0)
Platelets: 216 K/uL (ref 150–400)
RBC: 4.06 MIL/uL (ref 3.87–5.11)
RDW: 13 % (ref 11.5–15.5)
WBC: 6.5 K/uL (ref 4.0–10.5)
nRBC: 0 % (ref 0.0–0.2)

## 2024-01-28 LAB — COMPREHENSIVE METABOLIC PANEL WITH GFR
ALT: 10 U/L (ref 0–44)
AST: 16 U/L (ref 15–41)
Albumin: 4.1 g/dL (ref 3.5–5.0)
Alkaline Phosphatase: 169 U/L — ABNORMAL HIGH (ref 38–126)
Anion gap: 13 (ref 5–15)
BUN: 19 mg/dL (ref 8–23)
CO2: 29 mmol/L (ref 22–32)
Calcium: 10.4 mg/dL — ABNORMAL HIGH (ref 8.9–10.3)
Chloride: 98 mmol/L (ref 98–111)
Creatinine, Ser: 1.34 mg/dL — ABNORMAL HIGH (ref 0.44–1.00)
GFR, Estimated: 37 mL/min — ABNORMAL LOW
Glucose, Bld: 114 mg/dL — ABNORMAL HIGH (ref 70–99)
Potassium: 4.3 mmol/L (ref 3.5–5.1)
Sodium: 140 mmol/L (ref 135–145)
Total Bilirubin: 0.3 mg/dL (ref 0.0–1.2)
Total Protein: 7.5 g/dL (ref 6.5–8.1)

## 2024-01-28 NOTE — ED Provider Notes (Signed)
 "  EMERGENCY DEPARTMENT AT [facility] Provider Note   CSN: [id number] Arrival date & time: 01/28/24  0930     Patient presents with: Hemorrhoids   Danielle Colon is a 89 y.o. female.  With a history of atrial fibrillation on Eliquis  and external hemorrhoids who presents to the ED for hemorrhoids.  Patient has experienced burning discomfort from external hemorrhoids for the last week.  She was seen by urgent care earlier today and directed here for further evaluation given concern for heavy bleeding on anticoagulation.  Urgent care provider instructed her to come to the ED for cautery of bleeding hemorrhoid and blood work.  No active bleeding here in the ED.  No fevers chills significant pain upon my initial assessment   HPI     Prior to Admission medications  Medication Sig Start Date End Date Taking? Authorizing Provider  acetaminophen  (TYLENOL ) 500 MG tablet Take 500 mg by mouth 2 (two) times daily as needed for mild pain.    [provider]  apixaban  (ELIQUIS ) 5 MG TABS tablet Take 1 tablet (5 mg total) by mouth 2 (two) times daily. 09/27/22   Ursuy, Renee Lynn, PA-C  Artificial Tear Ointment (DRY EYES OP) Place 1 application  into both eyes at bedtime.    [provider]  B Complex CAPS 1 capsule.    [provider]  Cholecalciferol  (VITAMIN D3) 1000 units CAPS Take 1,000 Units by mouth daily.    [provider]  CREON  24000-76000 units CPEP Take 24,000 Units by mouth with breakfast, with lunch, and with evening meal. 04/23/20   [provider]  diltiazem  (CARDIZEM  CD) 180 MG 24 hr capsule Take 1 capsule (180 mg total) by mouth 2 (two) times daily. 09/15/22   Fernande Elspeth BROCKS, MD  diltiazem  (CARDIZEM  LA) 360 MG 24 hr tablet 1 tablet Orally Once a day; Duration: 90 days 09/28/23   [provider]  ferrous sulfate 325 (65 FE) MG tablet 1 tablet Orally once a day    [provider]  flecainide  (TAMBOCOR ) 50  MG tablet TAKE 1 TABLET BY MOUTH TWICE A DAY 10/02/23   Ollis, Daphne L, NP  FLUoxetine (PROZAC) 20 MG capsule Take 20 mg by mouth every other day. 02/20/23   [provider]  furosemide  (LASIX ) 40 MG tablet Take 1.5 tablets (60 mg total) by mouth daily. TAKE 40 MG IN AM TAKE 20 MG IN THE PM DAILY 12/04/23   Leverne Charlies Helling, PA-C  hydrocortisone 2.5 % cream 1 Application. 05/25/23   [provider]  Multiple Vitamins-Minerals (CENTRUM SILVER 50+WOMEN PO) Take 1 tablet by mouth daily.    [provider]  pantoprazole  (PROTONIX ) 40 MG tablet Take 40 mg by mouth daily before breakfast. 05/01/20   [provider]  potassium chloride  (KLOR-CON  M) 10 MEQ tablet Take 1 tablet (10 mEq total) by mouth daily. 02/23/23   Aniceto Daphne CROME, NP  potassium chloride  (KLOR-CON ) 10 MEQ tablet 1 tablet with food Orally once a day    [provider]    Allergies: Carafate  [sucralfate ], Cephalosporins, Tessalon perles [benzonatate], Neurontin [gabapentin], and Zantac [ranitidine hcl]    Review of Systems  Updated Vital Signs BP (!) 141/68   Pulse 65   Temp 98.6 F (37 C)   Resp 18   Ht 5' 1 (1.549 m)   Wt 67 kg   SpO2 98%   BMI 27.91 kg/m   Physical Exam Vitals and nursing note reviewed.  HENT:     Head: Normocephalic and atraumatic.  Eyes:     Pupils: Pupils are equal, round, and reactive to light.  Cardiovascular:     Rate and Rhythm: Normal rate and regular rhythm.  Pulmonary:     Effort: Pulmonary effort is normal.     Breath sounds: Normal breath sounds.  Abdominal:     Palpations: Abdomen is soft.     Tenderness: There is no abdominal tenderness.  Genitourinary:    Comments: External hemorrhoid in 2 o'clock position tender without active bleeding Skin:    General: Skin is warm and dry.  Neurological:     Mental Status: She is alert.  Psychiatric:        Mood and Affect: Mood normal.     (all labs ordered are listed, but only abnormal results  are displayed) Labs Reviewed  COMPREHENSIVE METABOLIC PANEL WITH GFR - Abnormal; Notable for the following components:      Result Value   Glucose, Bld 114 (*)    Creatinine, Ser 1.34 (*)    Calcium 10.4 (*)    Alkaline Phosphatase 169 (*)    GFR, Estimated 37 (*)    All other components within normal limits  CBC  POC OCCULT BLOOD, ED    EKG: None  Radiology: No results found.   Procedures   Medications Ordered in the ED - No data to display                                  Medical Decision Making 89 year old female with history as above presenting for bleeding external hemorrhoid.  Hemodynamically stable.  No severe discomfort on my initial assessment.  Rectal exam performed with RN Mas present.  Exam reveals external hemorrhoid in the 2 o'clock position some mild tenderness no active bleeding.  Laboratory workup not consistent with severe bleeding that would require transfusion and again blood pressure stable.  I discussed symptomatic management of external hemorrhoids and need for bowel regimen with the patient.  She is dissatisfied that her external hemorrhoids are not being removed today.  I will provide her with outpatient general surgery follow-up if the hemorrhoids do not improve  Amount and/or Complexity of Data Reviewed Labs: ordered.        Final diagnoses:  External hemorrhoid, bleeding    ED Discharge Orders     None          Pamella Ozell LABOR, DO 01/28/24 1105  "

## 2024-01-28 NOTE — Discharge Instructions (Signed)
 You were seen in the emerged ferment given concern for bleeding hemorrhoids The hemorrhoids were visualized on exam and were not bleeding at this time Your blood counts looked okay and you do not need a blood transfusion Continue to apply Preparation H and other topical remedies for hemorrhoids at home Follow a bowel regimen including MiraLAX  and Metamucil to ensure your stools are soft If your hemorrhoids persist despite measures at home please call the office for General Surgery provided to schedule an appointment to be seen in the office by surgeon Return to the emergency room for severe bleeding or pain

## 2024-01-28 NOTE — ED Provider Notes (Signed)
 " [facility]    CSN: [id number] Arrival date & time: 01/28/24  9180      History   Chief Complaint Chief Complaint  Patient presents with   Rectal Bleeding    HPI Danielle Colon is a 89 y.o. female.   Pt presents today due to one week of rectal bleeding. Pt has a hx of hemorrhoids. Pt states that she has been using hydrocortisone cream for symptoms without relief. Pt states that she has had something similar in the past, she had to go to the ED for the bleeding to stop. Pt was instructed by providers that if this happens again that she needs to report to the ED. Pt thought she was going to the ED but her daughter brought her to urgent care. Pt states that she has not felt any fatigue since bleeding for one week. Pt reports that the toilet has been full of blood.   The history is provided by the patient.  Rectal Bleeding   Past Medical History:  Diagnosis Date   Atrial fibrillation (HCC)    Bell's palsy 2009;    on the right   Brain TIA    pt denies this hx on 07/18/2013; they thought it was a stroke but dr said later that it was Bell's Palsy   Cerebrovascular disease    GERD (gastroesophageal reflux disease)    Hepatitis    don't remember what kind; had it ~ 3 times when I was younger; last time was in the 1990's   Hyperlipidemia    Hypertension    Pancreatitis     Patient Active Problem List   Diagnosis Date Noted   Alkaline phosphatase elevation 01/28/2024   Edema 01/28/2024   History of cerebrovascular accident 01/28/2024   History of pancreatitis 01/28/2024   History of syncope 01/28/2024   Hypercoagulable state 01/28/2024   Irritability 01/28/2024   Localized, primary osteoarthritis of hand 01/28/2024   Long term (current) use of anticoagulants 01/28/2024   MCI (mild cognitive impairment) 01/28/2024   Slow transit constipation 01/28/2024   Paroxysmal atrial fibrillation (HCC) 12/02/2022   Hypercoagulable state due to paroxysmal atrial  fibrillation (HCC) 12/02/2022   Alteration of consciousness 09/15/2022   Pacemaker ECG pattern 09/15/2022   Sick sinus syndrome due to sinoatrial node dysfunction (HCC) 09/15/2022   Thrombocytopenia 09/15/2022   Acute on chronic pancreatitis (HCC) 11/29/2021   DNR (do not resuscitate) 11/29/2021   AKI (acute kidney injury) 10/27/2021   Hyperglycemia 10/27/2021   Vertigo 12/08/2020   Hypercalcemia 12/08/2020   SSS (sick sinus syndrome) (HCC) 12/08/2020   Stage 3a chronic kidney disease (CKD) (HCC) 12/08/2020   Abnormal TSH 12/08/2020   Acute pancreatitis 05/22/2020   Sinus bradycardia 03/31/2020   Pacemaker 01/05/2020   Atrial flutter with rapid ventricular response (HCC) 01/05/2020   Syncope 07/23/2019   Chronic atrial fibrillation (HCC) 07/22/2019   Atrial fibrillation with RVR (HCC) 07/22/2019   Syncope and collapse 07/22/2019   Hypokalemia 03/01/2019   Anemia 03/01/2019   Pancreatitis 02/28/2019   Hyperlipidemia 02/28/2019   Obesity (BMI 30.0-34.9) 02/28/2019   Hypoxia 02/28/2019   GERD (gastroesophageal reflux disease) 07/19/2013   Urinary incontinence 07/19/2013   Lung nodule seen on imaging study 07/19/2013   HTN (hypertension) 07/18/2013   History of Bell's palsy 06/27/2013    Past Surgical History:  Procedure Laterality Date   APPENDECTOMY  ~ 1971   CARDIOVERSION N/A 12/21/2022   Procedure: CARDIOVERSION;  Surgeon: Perla Evalene PARAS, MD;  Location: [facility]  ORS;  Service: Cardiovascular;  Laterality: N/A;   CATARACT EXTRACTION Right    CHOLECYSTECTOMY  ~ 1971   PACEMAKER IMPLANT N/A 09/27/2019   Procedure: PACEMAKER IMPLANT;  Surgeon: Fernande Elspeth BROCKS, MD;  Location: [facility] INVASIVE CV LAB;  Service: Cardiovascular;  Laterality: N/A;   TONSILLECTOMY  1949    OB History   No obstetric history on file.      Home Medications    Prior to Admission medications  Medication Sig Start Date End Date Taking? Authorizing Provider  diltiazem  (CARDIZEM  LA) 360 MG 24 hr tablet  1 tablet Orally Once a day; Duration: 90 days 09/28/23  Yes [provider]  hydrocortisone 2.5 % cream 1 Application. 05/25/23  Yes [provider]  acetaminophen  (TYLENOL ) 500 MG tablet Take 500 mg by mouth 2 (two) times daily as needed for mild pain.    [provider]  apixaban  (ELIQUIS ) 5 MG TABS tablet Take 1 tablet (5 mg total) by mouth 2 (two) times daily. 09/27/22   Ursuy, Renee Lynn, PA-C  Artificial Tear Ointment (DRY EYES OP) Place 1 application  into both eyes at bedtime.    [provider]  B Complex CAPS 1 capsule.    [provider]  Cholecalciferol  (VITAMIN D3) 1000 units CAPS Take 1,000 Units by mouth daily.    [provider]  CREON  24000-76000 units CPEP Take 24,000 Units by mouth with breakfast, with lunch, and with evening meal. 04/23/20   [provider]  diltiazem  (CARDIZEM  CD) 180 MG 24 hr capsule Take 1 capsule (180 mg total) by mouth 2 (two) times daily. 09/15/22   Fernande Elspeth BROCKS, MD  ferrous sulfate 325 (65 FE) MG tablet 1 tablet Orally once a day    [provider]  flecainide  (TAMBOCOR ) 50 MG tablet TAKE 1 TABLET BY MOUTH TWICE A DAY 10/02/23   Ollis, Daphne L, NP  FLUoxetine (PROZAC) 20 MG capsule Take 20 mg by mouth every other day. 02/20/23   [provider]  furosemide  (LASIX ) 40 MG tablet Take 1.5 tablets (60 mg total) by mouth daily. TAKE 40 MG IN AM TAKE 20 MG IN THE PM DAILY 12/04/23   Ursuy, Renee Lynn, PA-C  Multiple Vitamins-Minerals (CENTRUM SILVER 50+WOMEN PO) Take 1 tablet by mouth daily.    [provider]  pantoprazole  (PROTONIX ) 40 MG tablet Take 40 mg by mouth daily before breakfast. 05/01/20   [provider]  potassium chloride  (KLOR-CON  M) 10 MEQ tablet Take 1 tablet (10 mEq total) by mouth daily. 02/23/23   Aniceto Daphne CROME, NP  potassium chloride  (KLOR-CON ) 10 MEQ tablet 1 tablet with food Orally once a day    [provider]    Family History Family  History  Problem Relation Age of Onset   Pneumonia Mother        old age   Heart attack Father    Diabetes Father    Diabetes Sister    Diabetes Brother    Cancer Sister    Diabetes Sister    Diabetes Sister     Social History Social History[1]   Allergies   Carafate  [sucralfate ], Cephalosporins, Tessalon perles [benzonatate], Neurontin [gabapentin], and Zantac [ranitidine hcl]   Review of Systems Review of Systems  Gastrointestinal:  Positive for hematochezia.     Physical Exam Triage Vital Signs ED Triage Vitals  Encounter Vitals Group     BP 01/28/24 0831 128/74     Girls Systolic BP Percentile --  Girls Diastolic BP Percentile --      Boys Systolic BP Percentile --      Boys Diastolic BP Percentile --      Pulse Rate 01/28/24 0831 81     Resp 01/28/24 0831 16     Temp 01/28/24 0831 98.7 F (37.1 C)     Temp Source 01/28/24 0831 Oral     SpO2 01/28/24 0831 94 %     Weight --      Height --      Head Circumference --      Peak Flow --      Pain Score 01/28/24 0830 0     Pain Loc --      Pain Education --      Exclude from Growth Chart --    No data found.  Updated Vital Signs BP 128/74 (BP Location: Right Arm)   Pulse 81   Temp 98.7 F (37.1 C) (Oral)   Resp 16   SpO2 94%   Visual Acuity Right Eye Distance:   Left Eye Distance:   Bilateral Distance:    Right Eye Near:   Left Eye Near:    Bilateral Near:     Physical Exam Vitals and nursing note reviewed. Exam conducted with a chaperone present.  Constitutional:      General: She is not in acute distress.    Appearance: Normal appearance. She is not ill-appearing, toxic-appearing or diaphoretic.  Eyes:     General: No scleral icterus. Cardiovascular:     Rate and Rhythm: Normal rate and regular rhythm.     Heart sounds: Normal heart sounds.  Pulmonary:     Effort: Pulmonary effort is normal. No respiratory distress.     Breath sounds: Normal breath sounds. No wheezing or rhonchi.   Genitourinary:    Rectum: External hemorrhoid and internal hemorrhoid present.     Comments: Bleeding internal hemorrhoid noted, tenderness to palpation of hemorrhoid Skin:    General: Skin is warm.  Neurological:     Mental Status: She is alert and oriented to person, place, and time.  Psychiatric:        Mood and Affect: Mood normal.        Behavior: Behavior normal.      UC Treatments / Results  Labs (all labs ordered are listed, but only abnormal results are displayed) Labs Reviewed - No data to display  EKG   Radiology No results found.  Procedures Procedures (including critical care time)  Medications Ordered in UC Medications - No data to display  Initial Impression / Assessment and Plan / UC Course  I have reviewed the triage vital signs and the nursing notes.  Pertinent labs & imaging results that were available during my care of the patient were reviewed by me and considered in my medical decision making (see chart for details).     Final Clinical Impressions(s) / UC Diagnoses   Final diagnoses:  Bleeding internal hemorrhoids  Rectal bleeding     Discharge Instructions      Please report to the ED for cautery of bleeding hemorrhoid and blood work to ensure that patient has not loss too much blood as she has been bleeding for 1 week.      ED Prescriptions   None    PDMP not reviewed this encounter.    [1]  Social History Tobacco Use   Smoking status: Never   Smokeless tobacco: Never  Vaping Use   Vaping status: Never Used  Substance Use Topics   Alcohol  use: No   Drug use: No     Andra Corean BROCKS, PA-C 01/28/24 9096  "

## 2024-01-28 NOTE — ED Triage Notes (Addendum)
 Pt present blood in stool, symptoms started over a week ago. Pt state her bottom is raw and burns.

## 2024-01-28 NOTE — ED Triage Notes (Signed)
 Pt from UC caox4 ambulatory c/o rectal bleeding from hemorrhoids x1 wk. Was told by UC to come to ED to make sure she didn't lose too much blood. Takes Eliquis . Minimal pain.

## 2024-01-28 NOTE — Discharge Instructions (Signed)
 Please report to the ED for cautery of bleeding hemorrhoid and blood work to ensure that patient has not loss too much blood as she has been bleeding for 1 week.

## 2024-01-28 NOTE — ED Notes (Signed)
 Patient is being discharged from the Urgent Care and sent to the Emergency Department via POV with family . Per Andra, PA-C, patient is in need of higher level of care due to need for further evaluation of rectal bleeding and potential need for cauterization of hemorrhoids. Patient is aware and verbalizes understanding of plan of care.  Vitals:   01/28/24 0831  BP: 128/74  Pulse: 81  Resp: 16  Temp: 98.7 F (37.1 C)  SpO2: 94%

## 2024-03-22 ENCOUNTER — Ambulatory Visit

## 2024-06-21 ENCOUNTER — Ambulatory Visit

## 2024-09-20 ENCOUNTER — Ambulatory Visit
